# Patient Record
Sex: Male | Born: 1950 | Race: White | Hispanic: No | Marital: Single | Smoking: Former smoker
Health system: Southern US, Community
[De-identification: ages and names within clinical notes are randomized; demographics above are authoritative.]

## PROBLEM LIST (undated history)

## (undated) DIAGNOSIS — G43909 Migraine, unspecified, not intractable, without status migrainosus: Secondary | ICD-10-CM

## (undated) DIAGNOSIS — M5030 Other cervical disc degeneration, unspecified cervical region: Secondary | ICD-10-CM

## (undated) DIAGNOSIS — M25559 Pain in unspecified hip: Secondary | ICD-10-CM

## (undated) DIAGNOSIS — R519 Headache, unspecified: Secondary | ICD-10-CM

## (undated) DIAGNOSIS — I10 Essential (primary) hypertension: Secondary | ICD-10-CM

## (undated) DIAGNOSIS — C61 Malignant neoplasm of prostate: Secondary | ICD-10-CM

## (undated) DIAGNOSIS — R51 Headache: Secondary | ICD-10-CM

## (undated) DIAGNOSIS — N400 Enlarged prostate without lower urinary tract symptoms: Secondary | ICD-10-CM

## (undated) HISTORY — DX: Benign prostatic hyperplasia without lower urinary tract symptoms: N40.0

## (undated) HISTORY — PX: BASAL CELL CARCINOMA EXCISION: SHX1214

## (undated) HISTORY — DX: Other cervical disc degeneration, unspecified cervical region: M50.30

## (undated) HISTORY — PX: OTHER SURGICAL HISTORY: SHX169

## (undated) HISTORY — DX: Headache, unspecified: R51.9

## (undated) HISTORY — DX: Headache: R51

## (undated) HISTORY — DX: Migraine, unspecified, not intractable, without status migrainosus: G43.909

## (undated) HISTORY — DX: Pain in unspecified hip: M25.559

---

## 2010-07-29 DEATH — deceased

## 2010-12-14 ENCOUNTER — Ambulatory Visit: Payer: Self-pay | Admitting: Family Medicine

## 2011-01-29 HISTORY — PX: COLONOSCOPY: SHX174

## 2012-12-28 DEATH — deceased

## 2013-07-23 IMAGING — CR CERVICAL SPINE - COMPLETE 4+ VIEW
1 series · 6 of 6 positions shown · non-contrast
Comparison: none

REASON FOR EXAM: cervical pain
COMMENTS:

[Series 1: view not recorded · 0.17mm/px · 6 of 6 slices shown]
[im 1/6]
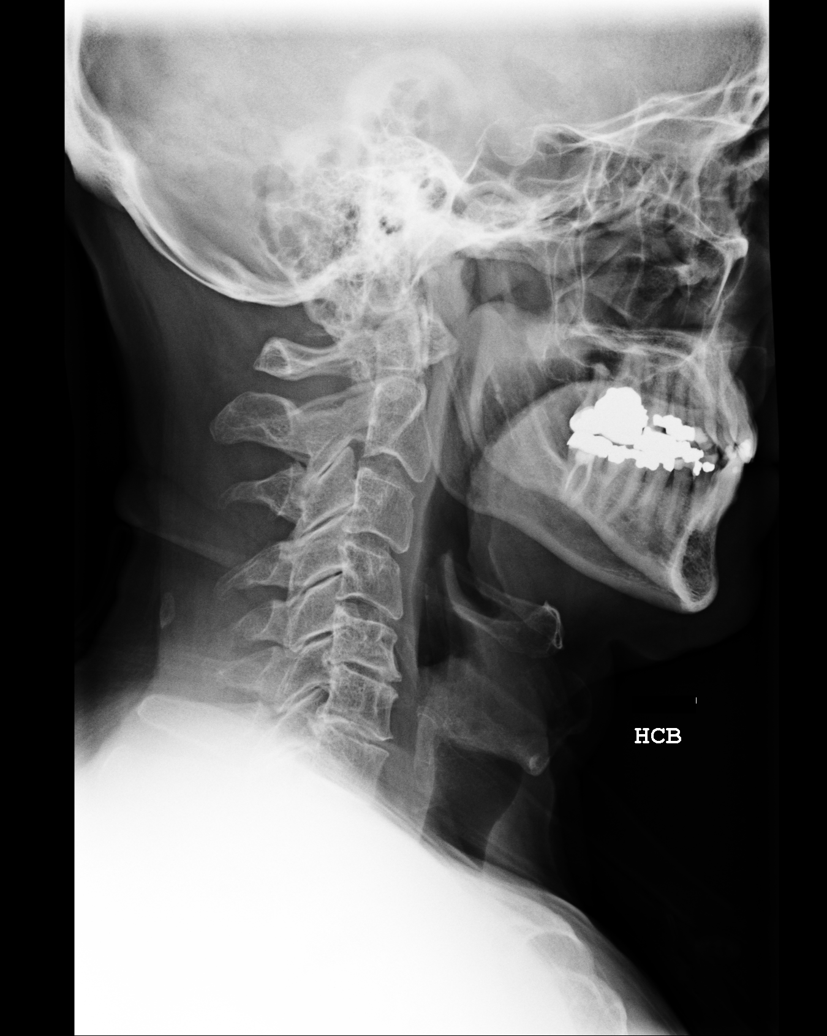
[im 2/6]
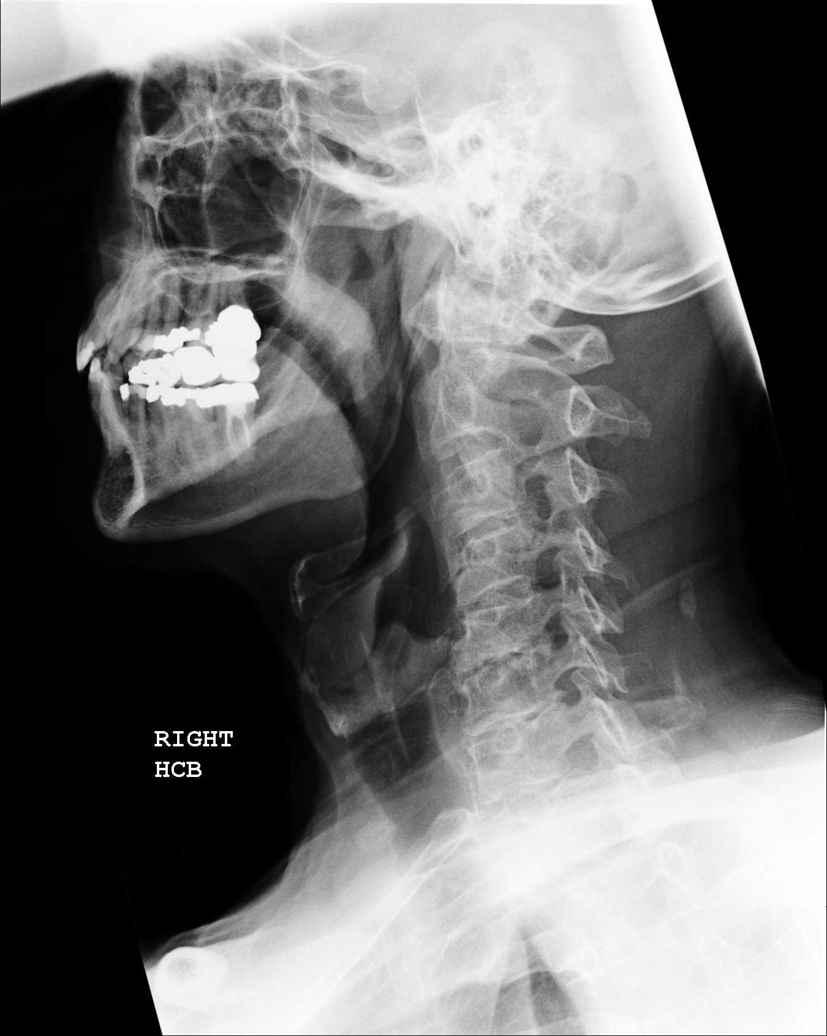
[im 3/6]
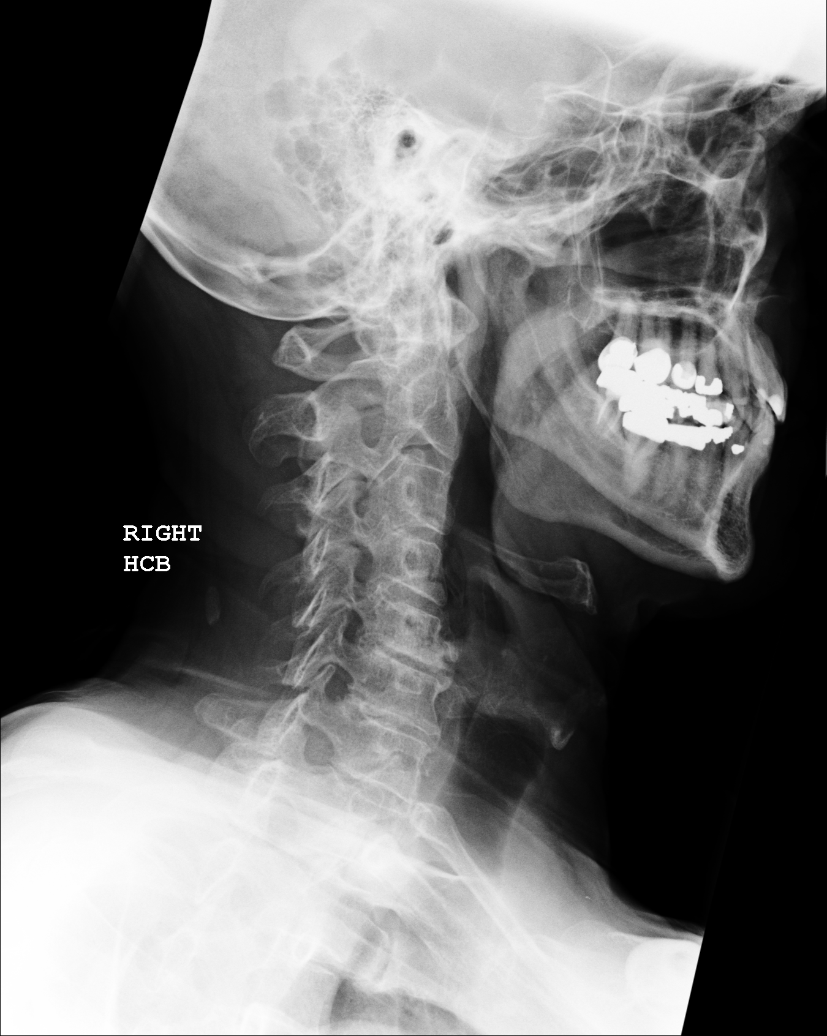
[im 4/6]
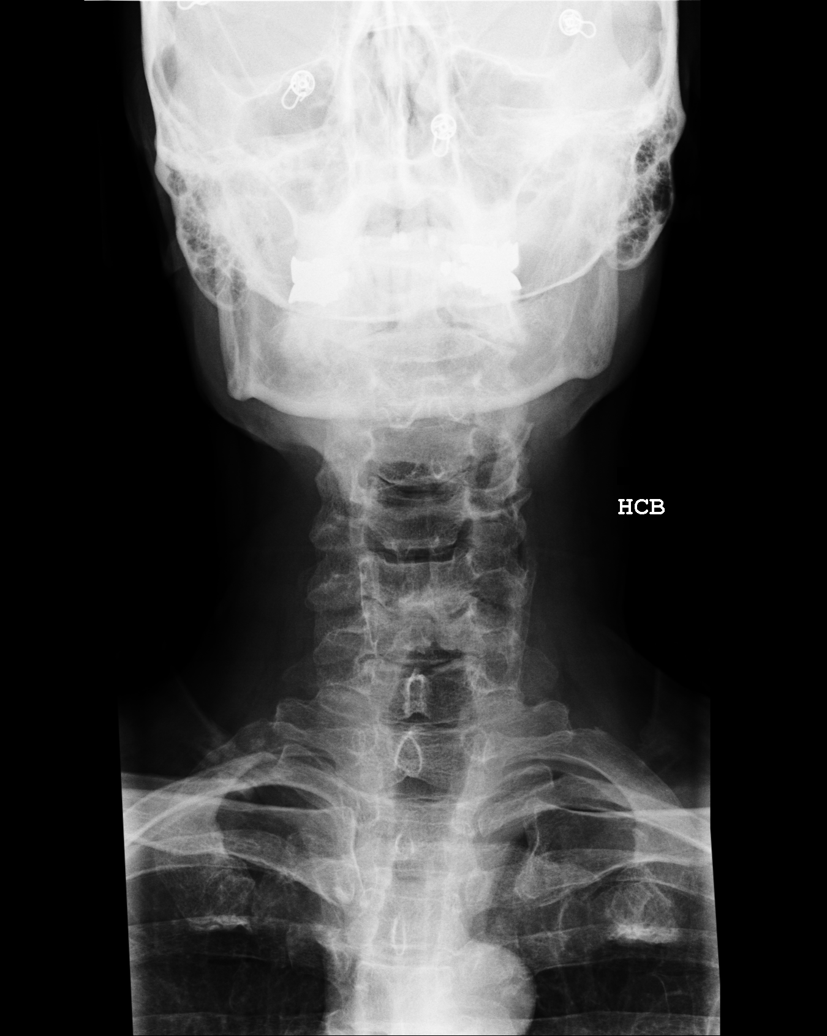
[im 5/6]
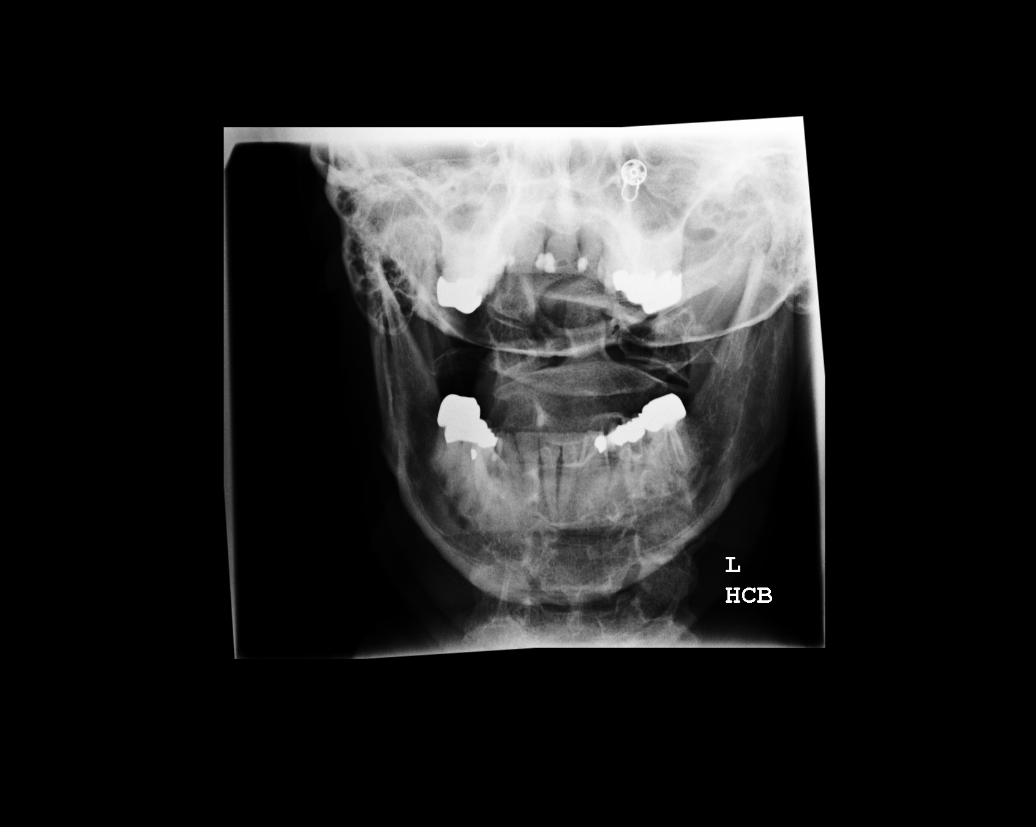
[im 6/6]
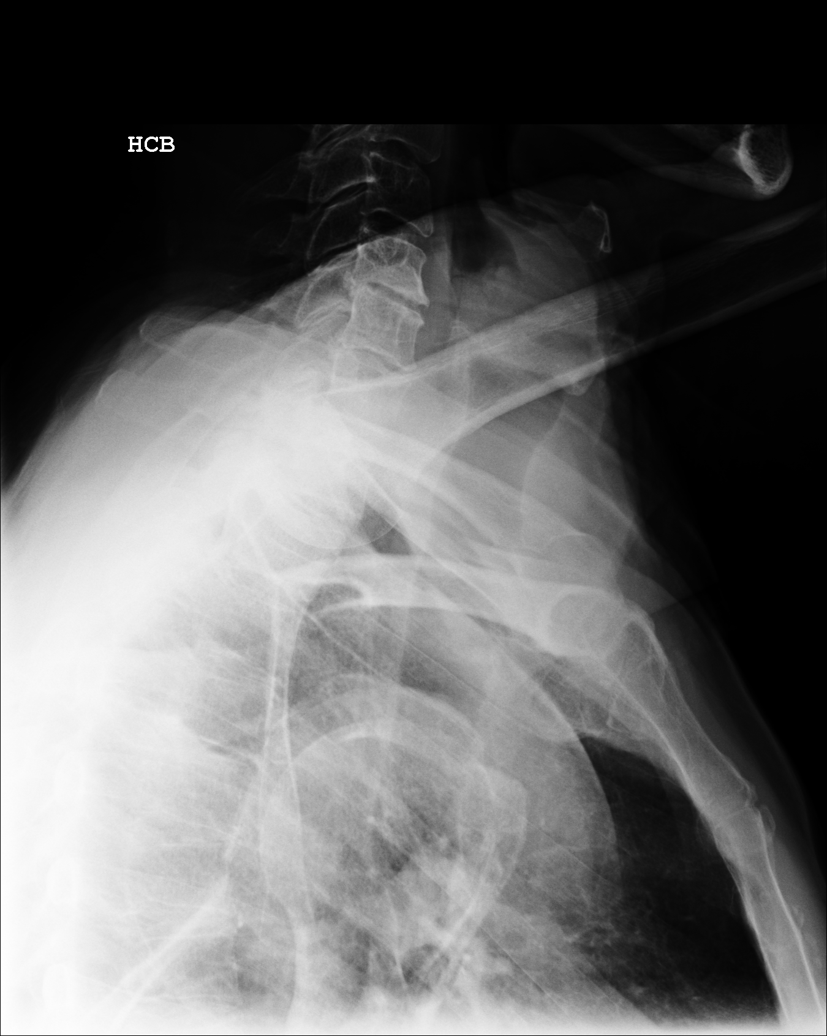

[6 of 6 positions shown; findings below may reference images not displayed]

PROCEDURE:     MDR - MDR CERVICAL SPINE COMPLETE  - December 14, 2010  [DATE]

RESULT:     The vertebral body heights are well-maintained. No fracture is
seen. There is narrowing of the C5-C6 and C6-C7 cervical disc spaces
compatible with cervical disc disease. Oblique view shows slight spur
impingement on the neural foramina bilaterally at C5-C6 and C6-C7. The
neural foramina otherwise are widely patent bilaterally. The odontoid
process is intact. No cervical rib formation is seen in the lateral view.
There is noted absence of the usual lordotic curvature which suggests
cervical muscle spasm.
IMPRESSION: 1. No fracture is seen.
2. There are changes of cervical disc disease at C5-C6 and C6-C7 with
associated mild spur impingement on the neural foramina bilaterally at these
levels.
3. There is straightening of the cervical spine which suggests cervical
muscle spasm.
4. The changes of cervical disc disease could be further evaluated by MR if
clinically indicated.

## 2013-08-20 ENCOUNTER — Ambulatory Visit: Payer: Self-pay | Admitting: Family Medicine

## 2013-08-20 LAB — LIPID PANEL
CHOLESTEROL: 169 mg/dL (ref 0–200)
HDL: 77 mg/dL — AB (ref 35–70)
LDL Cholesterol: 77 mg/dL
TRIGLYCERIDES: 75 mg/dL (ref 40–160)

## 2013-08-20 LAB — BASIC METABOLIC PANEL
Creatinine: 0.9 mg/dL (ref <=1.3)
Glucose: 89 mg/dL

## 2013-08-20 LAB — PSA: PSA: 2

## 2014-05-24 DIAGNOSIS — M1990 Unspecified osteoarthritis, unspecified site: Secondary | ICD-10-CM | POA: Insufficient documentation

## 2014-05-24 DIAGNOSIS — R51 Headache: Secondary | ICD-10-CM

## 2014-05-24 DIAGNOSIS — F410 Panic disorder [episodic paroxysmal anxiety] without agoraphobia: Secondary | ICD-10-CM | POA: Insufficient documentation

## 2014-05-24 DIAGNOSIS — R519 Headache, unspecified: Secondary | ICD-10-CM | POA: Insufficient documentation

## 2014-05-24 DIAGNOSIS — M9979 Connective tissue and disc stenosis of intervertebral foramina of abdomen and other regions: Secondary | ICD-10-CM | POA: Insufficient documentation

## 2014-05-24 DIAGNOSIS — Z0000 Encounter for general adult medical examination without abnormal findings: Secondary | ICD-10-CM | POA: Insufficient documentation

## 2014-05-24 DIAGNOSIS — Z8669 Personal history of other diseases of the nervous system and sense organs: Secondary | ICD-10-CM | POA: Insufficient documentation

## 2014-05-24 DIAGNOSIS — N400 Enlarged prostate without lower urinary tract symptoms: Secondary | ICD-10-CM | POA: Insufficient documentation

## 2014-08-26 ENCOUNTER — Encounter: Payer: Self-pay | Admitting: Family Medicine

## 2014-09-05 ENCOUNTER — Other Ambulatory Visit: Payer: Self-pay | Admitting: Family Medicine

## 2014-09-05 DIAGNOSIS — M2550 Pain in unspecified joint: Secondary | ICD-10-CM

## 2014-10-07 ENCOUNTER — Ambulatory Visit (INDEPENDENT_AMBULATORY_CARE_PROVIDER_SITE_OTHER): Payer: Medicare Other | Admitting: Family Medicine

## 2014-10-07 ENCOUNTER — Encounter: Payer: Self-pay | Admitting: Family Medicine

## 2014-10-07 VITALS — HR 70 | Ht 70.0 in | Wt 176.0 lb

## 2014-10-07 DIAGNOSIS — E785 Hyperlipidemia, unspecified: Secondary | ICD-10-CM

## 2014-10-07 DIAGNOSIS — M5090 Cervical disc disorder, unspecified, unspecified cervical region: Secondary | ICD-10-CM | POA: Diagnosis not present

## 2014-10-07 DIAGNOSIS — Z0000 Encounter for general adult medical examination without abnormal findings: Secondary | ICD-10-CM | POA: Diagnosis not present

## 2014-10-07 DIAGNOSIS — G43909 Migraine, unspecified, not intractable, without status migrainosus: Secondary | ICD-10-CM

## 2014-10-07 DIAGNOSIS — R030 Elevated blood-pressure reading, without diagnosis of hypertension: Secondary | ICD-10-CM

## 2014-10-07 DIAGNOSIS — M2550 Pain in unspecified joint: Secondary | ICD-10-CM | POA: Diagnosis not present

## 2014-10-07 DIAGNOSIS — N400 Enlarged prostate without lower urinary tract symptoms: Secondary | ICD-10-CM

## 2014-10-07 DIAGNOSIS — IMO0001 Reserved for inherently not codable concepts without codable children: Secondary | ICD-10-CM

## 2014-10-07 DIAGNOSIS — M1611 Unilateral primary osteoarthritis, right hip: Secondary | ICD-10-CM | POA: Diagnosis not present

## 2014-10-07 MED ORDER — PROPRANOLOL HCL ER 80 MG PO CP24: 160.0000 mg | ORAL_CAPSULE | Freq: Every day | ORAL | Status: DC

## 2014-10-07 MED ORDER — CYCLOBENZAPRINE HCL 10 MG PO TABS: 10.0000 mg | ORAL_TABLET | ORAL | Status: DC | PRN

## 2014-10-07 MED ORDER — BUTALBITAL-ASPIRIN-CAFFEINE 50-325-40 MG PO CAPS: 1.0000 | ORAL_CAPSULE | ORAL | Status: DC | PRN

## 2014-10-07 MED ORDER — DUTASTERIDE 0.5 MG PO CAPS: 0.5000 mg | ORAL_CAPSULE | Freq: Every day | ORAL | Status: DC

## 2014-10-07 MED ORDER — ETODOLAC 500 MG PO TABS
500.0000 mg | ORAL_TABLET | Freq: Two times a day (BID) | ORAL | Status: DC
Start: 2014-10-07 — End: 2015-04-07

## 2014-10-07 NOTE — Progress Notes (Signed)
Name: Marcus Gonzalez   MRN: [id number]    DOB: 1950/07/14   Date:10/07/2014       Progress Note  Subjective  Chief Complaint  Chief Complaint  Patient presents with  . Annual Exam  . Headache  . degenerative disc disease    HPI Comments: physical exam without subject and objective concerns  Headache  This is a recurrent problem. The current episode started more than 1 year ago. The problem has been waxing and waning. The pain is located in the occipital region. The pain does not radiate. The quality of the pain is described as aching. Associated symptoms include blurred vision. Pertinent negatives include no abdominal pain, back pain, coughing, dizziness, ear pain, fever, hearing loss, insomnia, loss of balance, nausea, neck pain, numbness, phonophobia, photophobia, sore throat, tingling or weight loss. Nothing aggravates the symptoms. He has tried beta blockers for the symptoms. The treatment provided significant relief.    No problem-specific assessment & plan notes found for this encounter.   Past Medical History  Diagnosis Date  . Degenerative disc disease, cervical   . BPH (benign prostatic hyperplasia)   . Migraine   . Hip pain   . Degenerative disc disease, cervical   . Migraine     Past Surgical History  Procedure Laterality Date  . Skin ca removed    . Colonoscopy  2013    cleared for 5 yrs- Dr Allen Norris    Family History  Problem Relation Age of Onset  . Heart disease Father   . Diabetes Maternal Aunt   . Cancer Maternal Grandmother     Social History   Social History  . Marital Status: Single    Spouse Name: N/A  . Number of Children: N/A  . Years of Education: N/A   Occupational History  . Not on file.   Social History Main Topics  . Smoking status: Never Smoker   . Smokeless tobacco: Not on file  . Alcohol Use: 0.0 oz/week    0 Standard drinks or equivalent per week  . Drug Use: No  . Sexual Activity: Yes   Other Topics Concern  . Not on file    Social History Narrative    Allergies  Allergen Reactions  . Cephalexin      Review of Systems  Constitutional: Negative for fever, chills, weight loss and malaise/fatigue.  HENT: Negative for ear discharge, ear pain, hearing loss and sore throat.   Eyes: Positive for blurred vision. Negative for photophobia.  Respiratory: Negative for cough, sputum production, shortness of breath and wheezing.   Cardiovascular: Negative for chest pain, palpitations and leg swelling.  Gastrointestinal: Positive for heartburn. Negative for nausea, abdominal pain, diarrhea, constipation, blood in stool and melena.  Genitourinary: Negative for dysuria, urgency, frequency and hematuria.  Musculoskeletal: Negative for myalgias, back pain, joint pain and neck pain.  Skin: Negative for rash.  Neurological: Positive for headaches. Negative for dizziness, tingling, sensory change, focal weakness, numbness and loss of balance.  Endo/Heme/Allergies: Negative for environmental allergies and polydipsia. Does not bruise/bleed easily.  Psychiatric/Behavioral: Negative for depression and suicidal ideas. The patient is not nervous/anxious and does not have insomnia.      Objective  Filed Vitals:   10/07/14 0825  Pulse: 70  Height: 5\' 10"  (1.778 m)  Weight: 176 lb (79.833 kg)    Physical Exam  Constitutional: He is oriented to person, place, and time and well-developed, well-nourished, and in no distress.  HENT:  Head: Normocephalic.  Right Ear:  External ear normal.  Left Ear: External ear normal.  Nose: Nose normal.  Mouth/Throat: Oropharynx is clear and moist.  Eyes: Conjunctivae and EOM are normal. Pupils are equal, round, and reactive to light. Right eye exhibits no discharge. Left eye exhibits no discharge. No scleral icterus.  Neck: Normal range of motion. Neck supple. No JVD present. No tracheal deviation present. No thyromegaly present.  Cardiovascular: Normal rate, regular rhythm, normal heart  sounds and intact distal pulses.  Exam reveals no gallop and no friction rub.   No murmur heard. Pulmonary/Chest: Breath sounds normal. No respiratory distress. He has no wheezes. He has no rales.  Abdominal: Soft. Bowel sounds are normal. He exhibits no mass. There is no hepatosplenomegaly. There is no tenderness. There is no rebound, no guarding and no CVA tenderness.  Musculoskeletal: Normal range of motion. He exhibits no edema or tenderness.  Lymphadenopathy:    He has no cervical adenopathy.  Neurological: He is alert and oriented to person, place, and time. He has normal sensation, normal strength, normal reflexes and intact cranial nerves. No cranial nerve deficit.  Skin: Skin is warm. No rash noted.  Psychiatric: Mood and affect normal.      Assessment & Plan  Problem List Items Addressed This Visit    None    Visit Diagnoses    Annual physical exam    -  Primary    Relevant Orders    Renal Function Panel    Migraine without status migrainosus, not intractable, unspecified migraine type        Relevant Medications    butalbital-aspirin-caffeine (FIORINAL) 50-325-40 MG per capsule    cyclobenzaprine (FLEXERIL) 10 MG tablet    etodolac (LODINE) 500 MG tablet    propranolol ER (INDERAL LA) 80 MG 24 hr capsule    BPH (benign prostatic hyperplasia)        Relevant Medications    dutasteride (AVODART) 0.5 MG capsule    Other Relevant Orders    PSA    Primary osteoarthritis of right hip        Relevant Medications    butalbital-aspirin-caffeine (FIORINAL) 50-325-40 MG per capsule    cyclobenzaprine (FLEXERIL) 10 MG tablet    etodolac (LODINE) 500 MG tablet    Cervical disc disease        Joint pain        Relevant Medications    etodolac (LODINE) 500 MG tablet    Hyperlipidemia        Relevant Medications    propranolol ER (INDERAL LA) 80 MG 24 hr capsule    Other Relevant Orders    Lipid Profile    Elevated blood pressure             Dr. Marius Betts [facility]  10/07/2014

## 2014-10-07 NOTE — Addendum Note (Signed)
Addended by: Fredderick Severance on: 10/07/2014 04:06 PM   Modules accepted: Orders

## 2014-10-08 LAB — LIPID PANEL
CHOLESTEROL TOTAL: 156 mg/dL (ref 100–199)
Chol/HDL Ratio: 2.3 ratio units (ref 0.0–5.0)
HDL: 67 mg/dL (ref >39)
LDL CALC: 73 mg/dL (ref 0–99)
TRIGLYCERIDES: 80 mg/dL (ref 0–149)
VLDL Cholesterol Cal: 16 mg/dL (ref 5–40)

## 2014-10-08 LAB — RENAL FUNCTION PANEL
Albumin: 4.4 g/dL (ref 3.6–4.8)
BUN / CREAT RATIO: 14 (ref 10–22)
BUN: 13 mg/dL (ref 8–27)
CALCIUM: 9.8 mg/dL (ref 8.6–10.2)
CO2: 27 mmol/L (ref 18–29)
CREATININE: 0.95 mg/dL (ref 0.76–1.27)
Chloride: 100 mmol/L (ref 97–108)
GFR calc Af Amer: 97 mL/min/{1.73_m2} (ref >59)
GFR calc non Af Amer: 84 mL/min/{1.73_m2} (ref >59)
GLUCOSE: 78 mg/dL (ref 65–99)
PHOSPHORUS: 2.8 mg/dL (ref 2.5–4.5)
POTASSIUM: 4.6 mmol/L (ref 3.5–5.2)
SODIUM: 142 mmol/L (ref 134–144)

## 2014-10-08 LAB — PSA: Prostate Specific Ag, Serum: 2.4 ng/mL (ref 0.0–4.0)

## 2014-10-10 ENCOUNTER — Other Ambulatory Visit: Payer: Self-pay

## 2014-10-19 ENCOUNTER — Other Ambulatory Visit: Payer: Self-pay

## 2014-10-19 DIAGNOSIS — N400 Enlarged prostate without lower urinary tract symptoms: Secondary | ICD-10-CM

## 2014-10-19 DIAGNOSIS — M2550 Pain in unspecified joint: Secondary | ICD-10-CM

## 2014-10-19 MED ORDER — FINASTERIDE 5 MG PO TABS: 5.0000 mg | ORAL_TABLET | Freq: Every day | ORAL | Status: DC

## 2014-10-19 MED ORDER — MELOXICAM 7.5 MG PO TABS: 7.5000 mg | ORAL_TABLET | Freq: Every day | ORAL | Status: DC

## 2014-11-06 DIAGNOSIS — H542 Low vision, both eyes: Secondary | ICD-10-CM | POA: Diagnosis not present

## 2014-11-06 DIAGNOSIS — Z87891 Personal history of nicotine dependence: Secondary | ICD-10-CM | POA: Diagnosis not present

## 2014-11-06 DIAGNOSIS — R0981 Nasal congestion: Secondary | ICD-10-CM | POA: Diagnosis not present

## 2014-11-06 DIAGNOSIS — H16201 Unspecified keratoconjunctivitis, right eye: Secondary | ICD-10-CM | POA: Diagnosis not present

## 2014-11-07 DIAGNOSIS — B300 Keratoconjunctivitis due to adenovirus: Secondary | ICD-10-CM | POA: Diagnosis not present

## 2014-11-07 DIAGNOSIS — H10433 Chronic follicular conjunctivitis, bilateral: Secondary | ICD-10-CM | POA: Diagnosis not present

## 2014-11-07 DIAGNOSIS — S0501XD Injury of conjunctiva and corneal abrasion without foreign body, right eye, subsequent encounter: Secondary | ICD-10-CM | POA: Diagnosis not present

## 2014-11-10 ENCOUNTER — Other Ambulatory Visit: Payer: Self-pay

## 2014-11-10 DIAGNOSIS — S0501XD Injury of conjunctiva and corneal abrasion without foreign body, right eye, subsequent encounter: Secondary | ICD-10-CM | POA: Diagnosis not present

## 2014-11-10 DIAGNOSIS — C44229 Squamous cell carcinoma of skin of left ear and external auricular canal: Secondary | ICD-10-CM | POA: Diagnosis not present

## 2014-11-10 DIAGNOSIS — B300 Keratoconjunctivitis due to adenovirus: Secondary | ICD-10-CM | POA: Diagnosis not present

## 2014-11-10 DIAGNOSIS — C44719 Basal cell carcinoma of skin of left lower limb, including hip: Secondary | ICD-10-CM | POA: Diagnosis not present

## 2014-11-11 DIAGNOSIS — D2272 Melanocytic nevi of left lower limb, including hip: Secondary | ICD-10-CM | POA: Diagnosis not present

## 2014-11-11 DIAGNOSIS — D485 Neoplasm of uncertain behavior of skin: Secondary | ICD-10-CM | POA: Diagnosis not present

## 2014-11-11 DIAGNOSIS — D2271 Melanocytic nevi of right lower limb, including hip: Secondary | ICD-10-CM | POA: Diagnosis not present

## 2014-11-11 DIAGNOSIS — D2261 Melanocytic nevi of right upper limb, including shoulder: Secondary | ICD-10-CM | POA: Diagnosis not present

## 2014-11-11 DIAGNOSIS — Z85828 Personal history of other malignant neoplasm of skin: Secondary | ICD-10-CM | POA: Diagnosis not present

## 2014-11-14 DIAGNOSIS — X32XXXA Exposure to sunlight, initial encounter: Secondary | ICD-10-CM | POA: Diagnosis not present

## 2014-11-14 DIAGNOSIS — L570 Actinic keratosis: Secondary | ICD-10-CM | POA: Diagnosis not present

## 2014-11-14 DIAGNOSIS — B300 Keratoconjunctivitis due to adenovirus: Secondary | ICD-10-CM | POA: Diagnosis not present

## 2014-11-17 ENCOUNTER — Other Ambulatory Visit: Payer: Self-pay | Admitting: Family Medicine

## 2014-11-17 DIAGNOSIS — B300 Keratoconjunctivitis due to adenovirus: Secondary | ICD-10-CM | POA: Diagnosis not present

## 2014-11-21 ENCOUNTER — Other Ambulatory Visit: Payer: Self-pay

## 2014-11-21 DIAGNOSIS — I10 Essential (primary) hypertension: Secondary | ICD-10-CM

## 2014-11-21 MED ORDER — LISINOPRIL 10 MG PO TABS: 10.0000 mg | ORAL_TABLET | Freq: Every day | ORAL | Status: DC

## 2014-11-22 ENCOUNTER — Encounter: Payer: Self-pay | Admitting: Family Medicine

## 2014-11-22 ENCOUNTER — Ambulatory Visit (INDEPENDENT_AMBULATORY_CARE_PROVIDER_SITE_OTHER): Payer: Medicare Other | Admitting: Family Medicine

## 2014-11-22 VITALS — BP 120/80 | HR 64 | Ht 70.0 in | Wt 170.0 lb

## 2014-11-22 DIAGNOSIS — J0100 Acute maxillary sinusitis, unspecified: Secondary | ICD-10-CM

## 2014-11-22 DIAGNOSIS — I10 Essential (primary) hypertension: Secondary | ICD-10-CM

## 2014-11-22 DIAGNOSIS — Z23 Encounter for immunization: Secondary | ICD-10-CM

## 2014-11-22 MED ORDER — AZITHROMYCIN 250 MG PO TABS: ORAL_TABLET | ORAL | Status: DC

## 2014-11-22 MED ORDER — LISINOPRIL 10 MG PO TABS: 10.0000 mg | ORAL_TABLET | Freq: Every day | ORAL | Status: DC

## 2014-11-22 NOTE — Addendum Note (Signed)
Addended by: Otilio Miu C on: 11/22/2014 12:11 PM   Modules accepted: Miquel Dunn

## 2014-11-22 NOTE — Addendum Note (Signed)
Addended by: Otilio Miu C on: 11/22/2014 12:17 PM   Modules accepted: Miquel Dunn

## 2014-11-22 NOTE — Progress Notes (Addendum)
Name: Marcus Gonzalez   MRN: [id number]    DOB: January 07, 1951   Date:11/22/2014       Progress Note  Subjective  Chief Complaint  Chief Complaint  Patient presents with  . Sinusitis  . Hypertension    Sinusitis This is a new problem. The current episode started yesterday. The problem has been waxing and waning since onset. There has been no fever. Associated symptoms include ear pain and sinus pressure. Pertinent negatives include no chills, congestion, coughing, diaphoresis, headaches, hoarse voice, neck pain, shortness of breath, sneezing, sore throat or swollen glands. The treatment provided no relief.  Hypertension Pertinent negatives include no blurred vision, chest pain, headaches, malaise/fatigue, neck pain, palpitations or shortness of breath.    No problem-specific assessment & plan notes found for this encounter.   Past Medical History  Diagnosis Date  . Degenerative disc disease, cervical   . BPH (benign prostatic hyperplasia)   . Migraine   . Hip pain   . Degenerative disc disease, cervical   . Migraine     Past Surgical History  Procedure Laterality Date  . Skin ca removed    . Colonoscopy  2013    cleared for 5 yrs- Dr Allen Norris    Family History  Problem Relation Age of Onset  . Heart disease Father   . Diabetes Maternal Aunt   . Cancer Maternal Grandmother     Social History   Social History  . Marital Status: Single    Spouse Name: N/A  . Number of Children: N/A  . Years of Education: N/A   Occupational History  . Not on file.   Social History Main Topics  . Smoking status: Never Smoker   . Smokeless tobacco: Not on file  . Alcohol Use: 0.0 oz/week    0 Standard drinks or equivalent per week  . Drug Use: No  . Sexual Activity: Yes   Other Topics Concern  . Not on file   Social History Narrative    Allergies  Allergen Reactions  . Cephalexin      Review of Systems  Constitutional: Negative for fever, chills, weight loss,  malaise/fatigue and diaphoresis.  HENT: Positive for ear pain and sinus pressure. Negative for congestion, ear discharge, hoarse voice, sneezing and sore throat.   Eyes: Negative for blurred vision.  Respiratory: Negative for cough, sputum production, shortness of breath and wheezing.   Cardiovascular: Negative for chest pain, palpitations and leg swelling.  Gastrointestinal: Negative for heartburn, nausea, abdominal pain, diarrhea, constipation, blood in stool and melena.  Genitourinary: Negative for dysuria, urgency, frequency and hematuria.  Musculoskeletal: Negative for myalgias, back pain, joint pain and neck pain.  Skin: Negative for rash.  Neurological: Negative for dizziness, tingling, sensory change, focal weakness and headaches.  Endo/Heme/Allergies: Negative for environmental allergies and polydipsia. Does not bruise/bleed easily.  Psychiatric/Behavioral: Negative for depression and suicidal ideas. The patient is not nervous/anxious and does not have insomnia.      Objective  Filed Vitals:   11/22/14 1150  BP: 120/80  Pulse: 64  Height: 5\' 10"  (1.778 m)  Weight: 170 lb (77.111 kg)    Physical Exam  Constitutional: He is oriented to person, place, and time and well-developed, well-nourished, and in no distress.  HENT:  Head: Normocephalic.  Right Ear: External ear normal.  Left Ear: External ear normal.  Nose: Nose normal.  Mouth/Throat: Oropharynx is clear and moist.  Eyes: Conjunctivae and EOM are normal. Pupils are equal, round, and reactive to light.  Right eye exhibits no discharge. Left eye exhibits no discharge. No scleral icterus.  Neck: Normal range of motion. Neck supple. No JVD present. No tracheal deviation present. No thyromegaly present.  Cardiovascular: Normal rate, regular rhythm, normal heart sounds and intact distal pulses.  Exam reveals no gallop and no friction rub.   No murmur heard. Pulmonary/Chest: Breath sounds normal. No respiratory distress. He  has no wheezes. He has no rales.  Abdominal: Soft. Bowel sounds are normal. He exhibits no mass. There is no hepatosplenomegaly. There is no tenderness. There is no rebound, no guarding and no CVA tenderness.  Musculoskeletal: Normal range of motion. He exhibits no edema or tenderness.  Lymphadenopathy:    He has no cervical adenopathy.  Neurological: He is alert and oriented to person, place, and time. He has normal sensation, normal strength and intact cranial nerves. No cranial nerve deficit.  Skin: Skin is warm. No rash noted.  Psychiatric: Mood and affect normal.  Nursing note and vitals reviewed.     Assessment & Plan  Problem List Items Addressed This Visit    None    Visit Diagnoses    Need for influenza vaccination    -  Primary    Relevant Orders    Flu Vaccine QUAD 36+ mos PF IM (Fluarix & Fluzone Quad PF) (Completed)    Acute maxillary sinusitis, recurrence not specified        Relevant Medications    azithromycin (ZITHROMAX) 250 MG tablet    Essential hypertension        Relevant Medications    lisinopril (PRINIVIL,ZESTRIL) 10 MG tablet         Dr. Eulah Walkup [facility]  11/22/2014

## 2014-11-23 DIAGNOSIS — C44719 Basal cell carcinoma of skin of left lower limb, including hip: Secondary | ICD-10-CM | POA: Diagnosis not present

## 2014-11-24 DIAGNOSIS — B0052 Herpesviral keratitis: Secondary | ICD-10-CM | POA: Diagnosis not present

## 2014-11-24 DIAGNOSIS — B300 Keratoconjunctivitis due to adenovirus: Secondary | ICD-10-CM | POA: Diagnosis not present

## 2014-12-05 DIAGNOSIS — B0052 Herpesviral keratitis: Secondary | ICD-10-CM | POA: Diagnosis not present

## 2014-12-05 DIAGNOSIS — B300 Keratoconjunctivitis due to adenovirus: Secondary | ICD-10-CM | POA: Diagnosis not present

## 2014-12-26 DIAGNOSIS — M722 Plantar fascial fibromatosis: Secondary | ICD-10-CM | POA: Diagnosis not present

## 2014-12-26 DIAGNOSIS — M79672 Pain in left foot: Secondary | ICD-10-CM | POA: Diagnosis not present

## 2015-01-09 DIAGNOSIS — M722 Plantar fascial fibromatosis: Secondary | ICD-10-CM | POA: Diagnosis not present

## 2015-01-09 DIAGNOSIS — B0052 Herpesviral keratitis: Secondary | ICD-10-CM | POA: Diagnosis not present

## 2015-01-11 DIAGNOSIS — C44229 Squamous cell carcinoma of skin of left ear and external auricular canal: Secondary | ICD-10-CM | POA: Diagnosis not present

## 2015-03-03 DIAGNOSIS — D2261 Melanocytic nevi of right upper limb, including shoulder: Secondary | ICD-10-CM | POA: Diagnosis not present

## 2015-03-03 DIAGNOSIS — Z85828 Personal history of other malignant neoplasm of skin: Secondary | ICD-10-CM | POA: Diagnosis not present

## 2015-03-03 DIAGNOSIS — D2272 Melanocytic nevi of left lower limb, including hip: Secondary | ICD-10-CM | POA: Diagnosis not present

## 2015-03-03 DIAGNOSIS — L570 Actinic keratosis: Secondary | ICD-10-CM | POA: Diagnosis not present

## 2015-03-03 DIAGNOSIS — X32XXXA Exposure to sunlight, initial encounter: Secondary | ICD-10-CM | POA: Diagnosis not present

## 2015-03-03 DIAGNOSIS — C44712 Basal cell carcinoma of skin of right lower limb, including hip: Secondary | ICD-10-CM | POA: Diagnosis not present

## 2015-03-03 DIAGNOSIS — D485 Neoplasm of uncertain behavior of skin: Secondary | ICD-10-CM | POA: Diagnosis not present

## 2015-03-03 DIAGNOSIS — D0462 Carcinoma in situ of skin of left upper limb, including shoulder: Secondary | ICD-10-CM | POA: Diagnosis not present

## 2015-03-03 DIAGNOSIS — D225 Melanocytic nevi of trunk: Secondary | ICD-10-CM | POA: Diagnosis not present

## 2015-04-03 DIAGNOSIS — C44712 Basal cell carcinoma of skin of right lower limb, including hip: Secondary | ICD-10-CM | POA: Diagnosis not present

## 2015-04-03 DIAGNOSIS — D0462 Carcinoma in situ of skin of left upper limb, including shoulder: Secondary | ICD-10-CM | POA: Diagnosis not present

## 2015-04-07 ENCOUNTER — Encounter: Payer: Self-pay | Admitting: Family Medicine

## 2015-04-07 ENCOUNTER — Ambulatory Visit (INDEPENDENT_AMBULATORY_CARE_PROVIDER_SITE_OTHER): Payer: Medicare Other | Admitting: Family Medicine

## 2015-04-07 VITALS — BP 130/92 | HR 66 | Ht 70.0 in | Wt 176.0 lb

## 2015-04-07 DIAGNOSIS — Z0000 Encounter for general adult medical examination without abnormal findings: Secondary | ICD-10-CM | POA: Diagnosis not present

## 2015-04-07 DIAGNOSIS — Z8669 Personal history of other diseases of the nervous system and sense organs: Secondary | ICD-10-CM | POA: Diagnosis not present

## 2015-04-07 DIAGNOSIS — M2550 Pain in unspecified joint: Secondary | ICD-10-CM

## 2015-04-07 DIAGNOSIS — I10 Essential (primary) hypertension: Secondary | ICD-10-CM | POA: Diagnosis not present

## 2015-04-07 MED ORDER — ETODOLAC 500 MG PO TABS: 500.0000 mg | ORAL_TABLET | Freq: Two times a day (BID) | ORAL | Status: DC

## 2015-04-07 MED ORDER — LISINOPRIL 10 MG PO TABS: 10.0000 mg | ORAL_TABLET | Freq: Every day | ORAL | Status: DC

## 2015-04-07 MED ORDER — MELOXICAM 7.5 MG PO TABS: 7.5000 mg | ORAL_TABLET | Freq: Every day | ORAL | Status: DC

## 2015-04-07 NOTE — Patient Instructions (Signed)
The DASH Diet  Dietary Approaches to Stop Hypertension   What is hypertension?  Hypertension is the term for blood pressure that is consistently higher than normal. Blood pressure is the force of blood against artery walls. Blood pressure can be unhealthy if it is above 120/80. The higher your blood pressure, the greater the health risk. High blood pressure can be controlled if you take these steps:  . Maintain a healthy weight.  . Be physically active.  . Follow a healthy eating plan, which includes foods lower in salt and sodium.  . If you drink alcoholic beverages, do so in moderation.  As noted in this list, diet affects high blood pressure. Following the DASH diet and reducing the amount of sodium in your diet will help lower your blood pressure. It will also help prevent high blood pressure.   What is the DASH diet?  Dietary Approaches to Stop Hypertension (DASH) is a diet that is low in saturated fat, cholesterol, and total fat. It emphasizes fruits, vegetables, and low-fat dairy foods. The DASH diet also includes whole-grain products, fish, poultry, and nuts. It encourages fewer servings of red meat, sweets, and sugar-containing beverages. It is rich in magnesium, potassium, and calcium, as well as protein and fiber.   How do I get started on the DASH diet?  The DASH diet requires no special foods and has no hard-to-follow recipes. Start by seeing how DASH compares with your current eating habits. The DASH eating plan shown is based on 2,000 calories a day.   Your health care provider or a dietitian can help you determine how many calories a day you need. Most adults need somewhere between 1600 and 2800 calories a day. Serving sizes will vary between 1/2 cup and 1 1/4 cups. Check the product's nutrition label to determine serving sizes of particular products.  Type of Food Servings for Diet of 2000 Calories/Day  Grains/Grain products (include at least 3 whole grain foods each day) 7-8   Fruits 4-5  Vegetables 4-5  Low fat or non-fat dairy foods 2-3  Lean meats, fish, poultry 2 or less  Nuts, seeds, and legumes 4-5/week  Fats and sweets Limited   Make changes gradually.  Here are some suggestions that might help:  . If you now eat 1 or 2 servings of vegetables a day, add a serving at lunch and another at dinner.  . If you don't eat fruit now or have only juice at breakfast, add a serving to your meals or have it as a snack.  . Drink milk or water with lunch or dinner instead of soda, sugar sweetened tea, or alcohol. Choose low-fat (1%) or fat-free (skim) dairy products to reduce how much saturated fat, total fat, cholesterol, and calories you eat.  . If you have trouble digesting dairy products, try taking lactase enzyme pills or drops (available at drugstores and groceries) with the dairy foods. Or buy lactose-free milk or milk with lactase enzyme added to it.  . Read food labels on margarines and salad dressings to choose products lowest in fat.  . If you now eat large portions of meat, cut back gradually--by a half or a third at each meal. Limit meat to 6 ounces a day (2 servings). Three to four ounces is about the size of a deck of cards.  . Have 2 or more vegetarian-style (meatless) meals each week.   Increase servings of vegetables, rice, pasta, and beans in all meals.  Try casseroles and pasta, and   stir-fry dishes, which have less meat and more vegetables, grains, and beans.  . Use fruits canned in their own juice. Fresh fruits require little or no preparation. Dried fruits are a good choice to carry with you or to have ready in the car.  . Try these snacks ideas: unsalted pretzels or nuts mixed with raisins, graham crackers, low-fat and fat-free yogurt and frozen yogurt, popcorn with no salt or butter added, and raw vegetables.  . Choose whole grain foods to get more nutrients, including minerals and fiber. For example, choose whole-wheat bread or whole-grain cereals.   . Use fresh, frozen, or no-salt-added canned vegetables.   Remember to also reduce the salt and sodium in your diet. Try to have no more than 2000 milligrams (mg) of sodium per day, with a goal of further reducing the sodium to 1500 mg per day. Three important ways to reduce sodium are:  . Use reduced-sodium or no-salt-added food products.  . Use less salt when you prepare foods and do not add salt to your food at the table.  . Read fool labels. Aim for foods that are less than 5 percent of the daily value of sodium.   The DASH eating plan was not designed for weight loss. But it contains many lower calorie foods, such as fruits and vegetables. You can make it lower in calories by replacing higher calorie foods with more fruits and vegetables. Some ideas to increase fruits and vegetables and decrease calories include:  . Eat a medium apple instead of four shortbread cookies. You'll save 80 calories.  . Eat 1/4 cup of dried apricots instead of a 2-ounce bag of pork rinds. You'll save 230 calories.  . Have a hamburger that's 3 ounces instead of 6 ounces. Add a  cup serving of carrots and a 1/2 cup serving of spinach. You'll save more than 200 calories.  . Instead of 5 ounces of chicken, have a stir fry with 2 ounces ofchicken and 1 and 1/2 cups of raw vegetables. Use a small amount of vegetable oil. You'll save 50 calories.  . Have a 1/2 cup serving of low-fat frozen yogurt instead of a 1 and 1/2 ounce milk chocolate bar. You'll save about 110 calories.  . Use low-fat or fat-free condiments, such as fat free salad dressings.  . Eat smaller portions--cut back gradually.  . Use food labels to compare fat content in packaged foods. Items marked low-fat or fat-free may be lower in fat without being lower in calories than their regular versions.  . Limit foods with lots of added sugar, such as pies, flavored yogurts, candy bars, ice cream, sherbet, regular soft drinks, and fruit drinks.  . Drink water  or club soda instead of cola or other soda drinks.    

## 2015-04-07 NOTE — Progress Notes (Signed)
Patient: Marcus Gonzalez, Male    DOB: [birth year], 65 y.o.   MRN: [id number] Visit Date: 04/07/2015  Today's Provider: Otilio Miu, MD   Chief Complaint  Patient presents with  . Annual Exam    feeling fine but has had some past issues that he wanted to update you on- 1) dog scratched cornea in eye- better now. 2) tore tendon in L) foot- better now. 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 Everything is cleared up- just an FYI  . Hypertension  . Migraine  . Arthritis   Subjective:   Initial preventative physical exam Marcus Gonzalez is a 65 y.o. male who presents today for his Initial Preventative Physical Exam. He feels well. He reports exercising . He reports he is sleeping poorly.  HPI Comments: Patient present for maw.   Hypertension This is a chronic problem. The current episode started more than 1 year ago. The problem has been waxing and waning since onset. The problem is uncontrolled. Pertinent negatives include no anxiety, blurred vision, chest pain, headaches, malaise/fatigue, neck pain, orthopnea, palpitations, peripheral edema, PND, shortness of breath or sweats. Agents associated with hypertension include NSAIDs. There are no known risk factors for coronary artery disease. Past treatments include beta blockers and ACE inhibitors. The current treatment provides mild improvement. There are no compliance problems.  There is no history of angina, kidney disease, CAD/MI, CVA, heart failure, left ventricular hypertrophy, PVD, renovascular disease or retinopathy. There is no history of chronic renal disease or a hypertension causing med.  Migraine  This is a chronic problem. The quality of the pain is described as aching. Pertinent negatives include no abdominal pain, back pain, blurred vision, coughing, dizziness, ear pain, eye pain, eye redness, fever, hearing loss, nausea, neck pain, numbness, photophobia, seizures, sore throat, vomiting or weakness. He has tried beta blockers for the  symptoms. His past medical history is significant for hypertension.    Review of Systems  Constitutional: Negative for fever, chills, malaise/fatigue, appetite change, fatigue and unexpected weight change.  HENT: Negative for ear pain, facial swelling, hearing loss, nosebleeds, sneezing, sore throat and trouble swallowing.   Eyes: Negative for blurred vision, photophobia, pain, discharge, redness, itching and visual disturbance.  Respiratory: Negative for cough, choking, chest tightness, shortness of breath and wheezing.   Cardiovascular: Negative for chest pain, palpitations, orthopnea, leg swelling and PND.  Gastrointestinal: Negative for nausea, vomiting, abdominal pain, diarrhea, constipation, blood in stool and rectal pain.  Endocrine: Negative for cold intolerance, heat intolerance, polydipsia, polyphagia and polyuria.  Genitourinary: Negative for dysuria, urgency, frequency, hematuria, flank pain, decreased urine volume, discharge, penile swelling, scrotal swelling, difficulty urinating, penile pain and testicular pain.  Musculoskeletal: Negative for back pain, joint swelling, neck pain and neck stiffness.  Skin: Negative for color change and rash.  Allergic/Immunologic: Negative for immunocompromised state.  Neurological: Negative for dizziness, tremors, seizures, syncope, speech difficulty, weakness, light-headedness, numbness and headaches.  Hematological: Does not bruise/bleed easily.  Psychiatric/Behavioral: Negative for suicidal ideas, hallucinations, behavioral problems, confusion, self-injury, dysphoric mood and agitation. The patient is not nervous/anxious.     Social History   Social History  . Marital Status: Single    Spouse Name: N/A  . Number of Children: N/A  . Years of Education: N/A   Occupational History  . Not on file.   Social History Main Topics  . Smoking status: Never Smoker   . Smokeless tobacco: Not on file  . Alcohol Use: 0.0 oz/week    0  Standard  drinks or equivalent per week  . Drug Use: No  . Sexual Activity: Yes   Other Topics Concern  . Not on file   Social History Narrative    Patient Active Problem List   Diagnosis Date Noted  . Cephalalgia 05/24/2014  . Routine general medical examination at a health care facility 05/24/2014  . Anxiety attack 05/24/2014  . Arthritis 05/24/2014  . Benign fibroma of prostate 05/24/2014  . Narrowing of intervertebral disc space 05/24/2014  . History of migraine headaches 05/24/2014    Past Surgical History  Procedure Laterality Date  . Skin ca removed    . Colonoscopy  2013    cleared for 5 yrs- Dr Allen Norris    His family history includes Cancer in his maternal grandmother; Diabetes in his maternal aunt; Heart disease in his father.    Previous Medications   BUTALBITAL-ASPIRIN-CAFFEINE (FIORINAL) 50-325-40 MG PER CAPSULE    Take 1 capsule by mouth as needed.   PROPRANOLOL ER (INDERAL LA) 80 MG 24 HR CAPSULE    Take 2 capsules (160 mg total) by mouth daily.    Patient Care Team: Juline Patch, MD as PCP - General (Family Medicine)     Objective:   Vitals: BP 130/92 mmHg  Pulse 66  Ht 5\' 10"  (1.778 m)  Wt 176 lb (79.833 kg)  BMI 25.25 kg/m2  Physical Exam  Constitutional: He is oriented to person, place, and time. He appears well-developed and well-nourished.  HENT:  Head: Normocephalic.  Right Ear: External ear normal.  Left Ear: External ear normal.  Nose: Nose normal.  Mouth/Throat: Oropharynx is clear and moist.  Eyes: Conjunctivae and EOM are normal. Pupils are equal, round, and reactive to light.  Neck: Normal range of motion. Neck supple.  Cardiovascular: Normal rate, regular rhythm, normal heart sounds and intact distal pulses.   Pulmonary/Chest: Effort normal and breath sounds normal.  Abdominal: Soft. Bowel sounds are normal.  Genitourinary: Rectum normal, prostate normal and penis normal.  Musculoskeletal: Normal range of motion.  Neurological: He is  alert and oriented to person, place, and time. He has normal reflexes.  Skin: Skin is warm and dry.  Psychiatric: He has a normal mood and affect. His behavior is normal. Judgment and thought content normal.  Nursing note and vitals reviewed.    No exam data present  Activities of Daily Living In your present state of health, do you have any difficulty performing the following activities: 04/07/2015 10/07/2014  Hearing? N N  Vision? N N  Difficulty concentrating or making decisions? N N  Walking or climbing stairs? N N  Dressing or bathing? N N  Doing errands, shopping? N N    Fall Risk Assessment Fall Risk  04/07/2015 10/07/2014  Falls in the past year? No No     Patient reports there are not safety devices in place in shower at home.   Depression Screen PHQ 2/9 Scores 04/07/2015 10/07/2014  PHQ - 2 Score 0 0    Cognitive Testing - 6-CIT   Correct? Score   What year is it? yes 1 Yes = 0    No = 4  What month is it? yes 0 Yes = 0    No = 3  Remember:     Pia [address]au, [place], [place]     What time is it? yes 0 Yes = 0    No = 3  Count backwards from 20 to 1 yes 0 Correct =  0    1 error = 2   More than 1 error = 4  Say the months of the year in reverse. yes 0 Correct = 0    1 error = 2   More than 1 error = 4  What address did I ask you to remember? yes 0 Correct = 0  1 error = 2    2 error = 4    3 error = 6    4 error = 8    All wrong = 10       TOTAL SCORE  0/28   Interpretation:  Normal  Normal (0-7) Abnormal (8-28)     Assessment & Plan:     Initial Preventative Physical Exam  Reviewed patient's Family Medical History Reviewed and updated list of patient's medical providers Assessment of cognitive impairment was done Assessed patient's functional ability Established a written schedule for health screening [place] Completed and Reviewed  Exercise Activities and Dietary recommendations Goals    None      Immunization History   Administered Date(s) Administered  . Influenza,inj,Quad PF,36+ Mos 11/22/2014  . Influenza-Unspecified 10/29/2013  . Tdap 04/03/2010    Health Maintenance  Topic Date Due  . Hepatitis C Screening  [birth year]  . HIV Screening  10/24/1964  . INFLUENZA VACCINE  08/29/2015  . PNA vac Low Risk Adult (2 of 2 - PPSV23) 04/06/2016  . COLONOSCOPY  03/28/2020  . TETANUS/TDAP  04/02/2020  . ZOSTAVAX  Addressed      Discussed health benefits of physical activity, and encouraged him to engage in regular exercise appropriate for his age and condition.    ------------------------------------------------------------------------------------------------------------   Problem List Items Addressed This Visit      Other   History of migraine headaches    Other Visit Diagnoses    Medicare annual wellness visit, initial    -  Primary    Essential hypertension        Relevant Medications    lisinopril (PRINIVIL,ZESTRIL) 10 MG tablet    Joint pain        Relevant Medications    etodolac (LODINE) 500 MG tablet    meloxicam (MOBIC) 7.5 MG tablet      Pt refused vaccination today  Otilio Miu, MD [facility]  04/07/2015

## 2015-05-08 ENCOUNTER — Other Ambulatory Visit: Payer: Self-pay

## 2015-05-08 MED ORDER — LISINOPRIL 20 MG PO TABS: 20.0000 mg | ORAL_TABLET | Freq: Every day | ORAL | Status: DC

## 2015-05-17 DIAGNOSIS — C44229 Squamous cell carcinoma of skin of left ear and external auricular canal: Secondary | ICD-10-CM | POA: Diagnosis not present

## 2015-05-22 ENCOUNTER — Other Ambulatory Visit: Payer: Self-pay

## 2015-05-22 MED ORDER — AMLODIPINE BESYLATE 5 MG PO TABS: 5.0000 mg | ORAL_TABLET | Freq: Every day | ORAL | Status: DC

## 2015-05-22 MED ORDER — CYCLOBENZAPRINE HCL 10 MG PO TABS: 10.0000 mg | ORAL_TABLET | Freq: Three times a day (TID) | ORAL | Status: DC | PRN

## 2015-06-05 ENCOUNTER — Other Ambulatory Visit: Payer: Self-pay

## 2015-06-09 ENCOUNTER — Ambulatory Visit (INDEPENDENT_AMBULATORY_CARE_PROVIDER_SITE_OTHER): Payer: Medicare Other | Admitting: Family Medicine

## 2015-06-09 ENCOUNTER — Encounter: Payer: Self-pay | Admitting: Family Medicine

## 2015-06-09 VITALS — BP 130/90 | HR 88 | Ht 70.0 in | Wt 179.0 lb

## 2015-06-09 DIAGNOSIS — I10 Essential (primary) hypertension: Secondary | ICD-10-CM | POA: Diagnosis not present

## 2015-06-09 MED ORDER — LISINOPRIL 20 MG PO TABS: 20.0000 mg | ORAL_TABLET | Freq: Every day | ORAL | Status: DC

## 2015-06-09 MED ORDER — AMLODIPINE BESYLATE 5 MG PO TABS: 5.0000 mg | ORAL_TABLET | Freq: Every day | ORAL | Status: DC

## 2015-06-09 NOTE — Progress Notes (Signed)
Name: Marcus Gonzalez   MRN: [id number]    DOB: 06-18-1950   Date:06/09/2015       Progress Note  Subjective  Chief Complaint  Chief Complaint  Patient presents with  . Hypertension    Hypertension This is a recurrent problem. The current episode started in the past 7 days. The problem has been waxing and waning since onset. The problem is controlled. Pertinent negatives include no anxiety, blurred vision, chest pain, headaches, malaise/fatigue, neck pain, orthopnea, palpitations, peripheral edema, PND, shortness of breath or sweats. There are no associated agents to hypertension. Past treatments include ACE inhibitors and calcium channel blockers. The current treatment provides mild improvement. There are no compliance problems.  There is no history of angina, kidney disease, CAD/MI, CVA, heart failure, left ventricular hypertrophy, PVD, renovascular disease or retinopathy. There is no history of chronic renal disease or a hypertension causing med.    No problem-specific assessment & plan notes found for this encounter.   Past Medical History  Diagnosis Date  . Degenerative disc disease, cervical   . BPH (benign prostatic hyperplasia)   . Migraine   . Hip pain   . Degenerative disc disease, cervical   . Migraine     Past Surgical History  Procedure Laterality Date  . Skin ca removed    . Colonoscopy  2013    cleared for 5 yrs- Dr Allen Norris    Family History  Problem Relation Age of Onset  . Heart disease Father   . Diabetes Maternal Aunt   . Cancer Maternal Grandmother     Social History   Social History  . Marital Status: Single    Spouse Name: N/A  . Number of Children: N/A  . Years of Education: N/A   Occupational History  . Not on file.   Social History Main Topics  . Smoking status: Never Smoker   . Smokeless tobacco: Not on file  . Alcohol Use: 0.0 oz/week    0 Standard drinks or equivalent per week  . Drug Use: No  . Sexual Activity: Yes   Other Topics  Concern  . Not on file   Social History Narrative    Allergies  Allergen Reactions  . Cephalexin      Review of Systems  Constitutional: Negative for fever, chills, weight loss and malaise/fatigue.  HENT: Negative for ear discharge, ear pain and sore throat.   Eyes: Negative for blurred vision.  Respiratory: Negative for cough, sputum production, shortness of breath and wheezing.   Cardiovascular: Negative for chest pain, palpitations, orthopnea, leg swelling and PND.  Gastrointestinal: Negative for heartburn, nausea, abdominal pain, diarrhea, constipation, blood in stool and melena.  Genitourinary: Negative for dysuria, urgency, frequency and hematuria.  Musculoskeletal: Negative for myalgias, back pain, joint pain and neck pain.  Skin: Negative for rash.  Neurological: Negative for dizziness, tingling, sensory change, focal weakness and headaches.  Endo/Heme/Allergies: Negative for environmental allergies and polydipsia. Does not bruise/bleed easily.  Psychiatric/Behavioral: Negative for depression and suicidal ideas. The patient is not nervous/anxious and does not have insomnia.      Objective  Filed Vitals:   06/09/15 1421  BP: 130/90  Pulse: 88  Height: 5\' 10"  (1.778 m)  Weight: 179 lb (81.194 kg)    Physical Exam  Constitutional: He is oriented to person, place, and time and well-developed, well-nourished, and in no distress.  HENT:  Head: Normocephalic.  Right Ear: External ear normal.  Left Ear: External ear normal.  Nose: Nose normal.  Mouth/Throat: Oropharynx is clear and moist.  Eyes: Conjunctivae and EOM are normal. Pupils are equal, round, and reactive to light. Right eye exhibits no discharge. Left eye exhibits no discharge. No scleral icterus.  Neck: Normal range of motion. Neck supple. No JVD present. No tracheal deviation present. No thyromegaly present.  Cardiovascular: Normal rate, regular rhythm, normal heart sounds and intact distal pulses.  Exam  reveals no gallop and no friction rub.   No murmur heard. Pulmonary/Chest: Breath sounds normal. No respiratory distress. He has no wheezes. He has no rales.  Abdominal: Soft. Bowel sounds are normal. He exhibits no mass. There is no hepatosplenomegaly. There is no tenderness. There is no rebound, no guarding and no CVA tenderness.  Musculoskeletal: Normal range of motion. He exhibits no edema or tenderness.  Lymphadenopathy:    He has no cervical adenopathy.  Neurological: He is alert and oriented to person, place, and time. He has normal sensation, normal strength and intact cranial nerves. No cranial nerve deficit.  Skin: Skin is warm. No rash noted.  Psychiatric: Mood and affect normal.  Nursing note and vitals reviewed.     Assessment & Plan  Problem List Items Addressed This Visit    None    Visit Diagnoses    Essential hypertension    -  Primary    Relevant Medications    amLODipine (NORVASC) 5 MG tablet    lisinopril (PRINIVIL,ZESTRIL) 20 MG tablet         Dr. [facility]  06/09/2015

## 2015-07-06 ENCOUNTER — Other Ambulatory Visit: Payer: Self-pay | Admitting: Family Medicine

## 2015-07-12 DIAGNOSIS — C44212 Basal cell carcinoma of skin of right ear and external auricular canal: Secondary | ICD-10-CM | POA: Diagnosis not present

## 2015-07-12 DIAGNOSIS — D2272 Melanocytic nevi of left lower limb, including hip: Secondary | ICD-10-CM | POA: Diagnosis not present

## 2015-07-12 DIAGNOSIS — L570 Actinic keratosis: Secondary | ICD-10-CM | POA: Diagnosis not present

## 2015-07-12 DIAGNOSIS — D2261 Melanocytic nevi of right upper limb, including shoulder: Secondary | ICD-10-CM | POA: Diagnosis not present

## 2015-07-12 DIAGNOSIS — D225 Melanocytic nevi of trunk: Secondary | ICD-10-CM | POA: Diagnosis not present

## 2015-07-12 DIAGNOSIS — Z85828 Personal history of other malignant neoplasm of skin: Secondary | ICD-10-CM | POA: Diagnosis not present

## 2015-07-12 DIAGNOSIS — C44719 Basal cell carcinoma of skin of left lower limb, including hip: Secondary | ICD-10-CM | POA: Diagnosis not present

## 2015-07-12 DIAGNOSIS — D485 Neoplasm of uncertain behavior of skin: Secondary | ICD-10-CM | POA: Diagnosis not present

## 2015-07-20 ENCOUNTER — Other Ambulatory Visit: Payer: Self-pay

## 2015-08-07 ENCOUNTER — Ambulatory Visit (INDEPENDENT_AMBULATORY_CARE_PROVIDER_SITE_OTHER): Payer: Medicare Other | Admitting: Family Medicine

## 2015-08-07 ENCOUNTER — Encounter: Payer: Self-pay | Admitting: Family Medicine

## 2015-08-07 VITALS — BP 138/90 | HR 70 | Ht 70.0 in | Wt 173.0 lb

## 2015-08-07 DIAGNOSIS — R3911 Hesitancy of micturition: Secondary | ICD-10-CM

## 2015-08-07 DIAGNOSIS — R361 Hematospermia: Secondary | ICD-10-CM

## 2015-08-07 MED ORDER — SULFAMETHOXAZOLE-TRIMETHOPRIM 800-160 MG PO TABS: 1.0000 | ORAL_TABLET | Freq: Two times a day (BID) | ORAL | Status: DC

## 2015-08-07 NOTE — Progress Notes (Signed)
Name: Marcus Gonzalez   MRN: [id number]    DOB: 01-23-1951   Date:08/07/2015       Progress Note  Subjective  Chief Complaint  No chief complaint on file.   Urinary Tract Infection  This is a new problem. The current episode started in the past 7 days. The problem occurs intermittently. The problem has been waxing and waning. There has been no fever. Associated symptoms include hesitancy. Pertinent negatives include no chills, discharge, flank pain, frequency, hematuria, nausea, possible pregnancy, sweats, urgency or vomiting. He has tried nothing for the symptoms. The treatment provided mild relief. There is no history of catheterization, kidney stones, recurrent UTIs or urinary stasis.    No problem-specific assessment & plan notes found for this encounter.   Past Medical History  Diagnosis Date  . Degenerative disc disease, cervical   . BPH (benign prostatic hyperplasia)   . Migraine   . Hip pain   . Degenerative disc disease, cervical   . Migraine     Past Surgical History  Procedure Laterality Date  . Skin ca removed    . Colonoscopy  2013    cleared for 5 yrs- Dr Allen Norris    Family History  Problem Relation Age of Onset  . Heart disease Father   . Diabetes Maternal Aunt   . Cancer Maternal Grandmother     Social History   Social History  . Marital Status: Single    Spouse Name: N/A  . Number of Children: N/A  . Years of Education: N/A   Occupational History  . Not on file.   Social History Main Topics  . Smoking status: Never Smoker   . Smokeless tobacco: Not on file  . Alcohol Use: 0.0 oz/week    0 Standard drinks or equivalent per week  . Drug Use: No  . Sexual Activity: Yes   Other Topics Concern  . Not on file   Social History Narrative    Allergies  Allergen Reactions  . Cephalexin      Review of Systems  Constitutional: Negative for fever, chills, weight loss and malaise/fatigue.  HENT: Negative for ear discharge, ear pain and sore  throat.   Eyes: Negative for blurred vision.  Respiratory: Negative for cough, sputum production, shortness of breath and wheezing.   Cardiovascular: Negative for chest pain, palpitations and leg swelling.  Gastrointestinal: Negative for heartburn, nausea, vomiting, abdominal pain, diarrhea, constipation, blood in stool and melena.  Genitourinary: Positive for hesitancy. Negative for dysuria, urgency, frequency, hematuria and flank pain.  Musculoskeletal: Negative for myalgias, back pain, joint pain and neck pain.  Skin: Negative for rash.  Neurological: Negative for dizziness, tingling, sensory change, focal weakness and headaches.  Endo/Heme/Allergies: Negative for environmental allergies and polydipsia. Does not bruise/bleed easily.  Psychiatric/Behavioral: Negative for depression and suicidal ideas. The patient is not nervous/anxious and does not have insomnia.      Objective  Filed Vitals:   08/07/15 1334  BP: 138/90  Pulse: 70  Height: 5\' 10"  (1.778 m)  Weight: 173 lb (78.472 kg)    Physical Exam  Constitutional: He is oriented to person, place, and time and well-developed, well-nourished, and in no distress.  HENT:  Head: Normocephalic.  Right Ear: External ear normal.  Left Ear: External ear normal.  Nose: Nose normal.  Mouth/Throat: Oropharynx is clear and moist.  Eyes: Conjunctivae and EOM are normal. Pupils are equal, round, and reactive to light. Right eye exhibits no discharge. Left eye exhibits no discharge. No  scleral icterus.  Neck: Normal range of motion. Neck supple. No JVD present. No tracheal deviation present. No thyromegaly present.  Cardiovascular: Normal rate, regular rhythm, normal heart sounds and intact distal pulses.  Exam reveals no gallop and no friction rub.   No murmur heard. Pulmonary/Chest: Breath sounds normal. No respiratory distress. He has no wheezes. He has no rales.  Abdominal: Soft. Bowel sounds are normal. He exhibits no mass. There is no  hepatosplenomegaly. There is no tenderness. There is no rebound, no guarding and no CVA tenderness.  Genitourinary: Rectum normal, prostate normal and testes/scrotum normal. Rectal exam shows no tenderness. He exhibits no abnormal testicular mass, no testicular tenderness, no abnormal scrotal mass and no scrotal tenderness.  Musculoskeletal: Normal range of motion.  Lymphadenopathy:    He has no cervical adenopathy.  Neurological: He is alert and oriented to person, place, and time. He has normal sensation, normal strength and intact cranial nerves.  Skin: Skin is warm. No rash noted.  Psychiatric: Mood and affect normal.  Nursing note and vitals reviewed.     Assessment & Plan  Problem List Items Addressed This Visit    None    Visit Diagnoses    Hemospermia    -  Primary    Relevant Medications    sulfamethoxazole-trimethoprim (BACTRIM DS,SEPTRA DS) 800-160 MG tablet    Hesitancy of micturition        Relevant Medications    sulfamethoxazole-trimethoprim (BACTRIM DS,SEPTRA DS) 800-160 MG tablet         Dr. Deanna Jones [facility]  08/07/2015

## 2015-09-02 ENCOUNTER — Other Ambulatory Visit: Payer: Self-pay | Admitting: Family Medicine

## 2015-09-02 DIAGNOSIS — I10 Essential (primary) hypertension: Secondary | ICD-10-CM

## 2015-09-03 ENCOUNTER — Other Ambulatory Visit: Payer: Self-pay | Admitting: Family Medicine

## 2015-09-03 DIAGNOSIS — I10 Essential (primary) hypertension: Secondary | ICD-10-CM

## 2015-09-06 DIAGNOSIS — C44719 Basal cell carcinoma of skin of left lower limb, including hip: Secondary | ICD-10-CM | POA: Diagnosis not present

## 2015-10-13 ENCOUNTER — Ambulatory Visit (INDEPENDENT_AMBULATORY_CARE_PROVIDER_SITE_OTHER): Payer: Medicare Other | Admitting: Family Medicine

## 2015-10-13 ENCOUNTER — Encounter: Payer: Self-pay | Admitting: Family Medicine

## 2015-10-13 VITALS — BP 110/70 | HR 70 | Ht 70.0 in | Wt 167.0 lb

## 2015-10-13 DIAGNOSIS — N400 Enlarged prostate without lower urinary tract symptoms: Secondary | ICD-10-CM

## 2015-10-13 DIAGNOSIS — Z0000 Encounter for general adult medical examination without abnormal findings: Secondary | ICD-10-CM

## 2015-10-13 DIAGNOSIS — Z1211 Encounter for screening for malignant neoplasm of colon: Secondary | ICD-10-CM | POA: Diagnosis not present

## 2015-10-13 DIAGNOSIS — M5090 Cervical disc disorder, unspecified, unspecified cervical region: Secondary | ICD-10-CM

## 2015-10-13 DIAGNOSIS — I10 Essential (primary) hypertension: Secondary | ICD-10-CM | POA: Diagnosis not present

## 2015-10-13 DIAGNOSIS — M2550 Pain in unspecified joint: Secondary | ICD-10-CM | POA: Diagnosis not present

## 2015-10-13 LAB — HEMOCCULT GUIAC POC 1CARD (OFFICE): Fecal Occult Blood, POC: NEGATIVE

## 2015-10-13 MED ORDER — PROPRANOLOL HCL ER 80 MG PO CP24: 160.0000 mg | ORAL_CAPSULE | Freq: Every day | ORAL | 6 refills | Status: DC

## 2015-10-13 MED ORDER — TRAMADOL HCL 50 MG PO TABS: 50.0000 mg | ORAL_TABLET | Freq: Two times a day (BID) | ORAL | 0 refills | Status: DC | PRN

## 2015-10-13 MED ORDER — ETODOLAC 500 MG PO TABS: 500.0000 mg | ORAL_TABLET | Freq: Two times a day (BID) | ORAL | 11 refills | Status: DC

## 2015-10-13 MED ORDER — LISINOPRIL 20 MG PO TABS: 20.0000 mg | ORAL_TABLET | Freq: Every day | ORAL | 1 refills | Status: DC

## 2015-10-13 MED ORDER — BUTALBITAL-ASPIRIN-CAFFEINE 50-325-40 MG PO CAPS: 1.0000 | ORAL_CAPSULE | ORAL | 5 refills | Status: DC | PRN

## 2015-10-13 MED ORDER — CYCLOBENZAPRINE HCL 10 MG PO TABS: 10.0000 mg | ORAL_TABLET | Freq: Three times a day (TID) | ORAL | 1 refills | Status: DC | PRN

## 2015-10-13 NOTE — Progress Notes (Signed)
Name: Marcus Gonzalez   MRN: [id number]    DOB: 1950-02-25   Date:10/13/2015       Progress Note  Subjective  Chief Complaint  Chief Complaint  Patient presents with  . Annual Exam  . Neck Pain    wants something different from cyclobenzaprine  . Hypertension    Patient for annual physical exam.   Neck Pain   This is a recurrent (awoke at night with posterior headache/neck stiffness) problem. The current episode started more than 1 year ago. The problem occurs intermittently. The problem has been gradually improving. The pain is associated with nothing. The quality of the pain is described as aching. The pain is mild. Associated symptoms include headaches. Pertinent negatives include no chest pain, fever, numbness, tingling, weakness or weight loss. He has tried nothing for the symptoms. The treatment provided mild relief.  Hypertension  This is a chronic problem. The current episode started more than 1 year ago. The problem has been gradually worsening since onset. Associated symptoms include headaches and neck pain. Pertinent negatives include no blurred vision, chest pain, malaise/fatigue, palpitations or shortness of breath. There are no associated agents to hypertension. There are no known risk factors for coronary artery disease. Past treatments include ACE inhibitors, beta blockers and calcium channel blockers. The current treatment provides mild improvement. There are no compliance problems.  There is no history of angina, kidney disease, CAD/MI, CVA, heart failure, left ventricular hypertrophy, PVD, renovascular disease or retinopathy. There is no history of chronic renal disease or a hypertension causing med.    No problem-specific Assessment & Plan notes found for this encounter.   Past Medical History:  Diagnosis Date  . BPH (benign prostatic hyperplasia)   . Degenerative disc disease, cervical   . Degenerative disc disease, cervical   . Headache   . Hip pain   . Migraine    . Migraine     Past Surgical History:  Procedure Laterality Date  . COLONOSCOPY  2013   cleared for 5 yrs- Dr Allen Norris  . skin ca removed      Family History  Problem Relation Age of Onset  . Heart disease Father   . Diabetes Maternal Aunt   . Cancer Maternal Grandmother     Social History   Social History  . Marital status: Single    Spouse name: N/A  . Number of children: N/A  . Years of education: N/A   Occupational History  . Not on file.   Social History Main Topics  . Smoking status: Never Smoker  . Smokeless tobacco: Not on file  . Alcohol use 0.0 oz/week  . Drug use: No  . Sexual activity: Yes   Other Topics Concern  . Not on file   Social History Narrative  . No narrative on file    Allergies  Allergen Reactions  . Cephalexin      Review of Systems  Constitutional: Negative for chills, fever, malaise/fatigue and weight loss.  HENT: Negative for ear discharge, ear pain and sore throat.   Eyes: Negative for blurred vision.  Respiratory: Negative for cough, sputum production, shortness of breath and wheezing.   Cardiovascular: Negative for chest pain, palpitations and leg swelling.  Gastrointestinal: Negative for abdominal pain, blood in stool, constipation, diarrhea, heartburn, melena and nausea.  Genitourinary: Negative for dysuria, frequency, hematuria and urgency.  Musculoskeletal: Positive for neck pain. Negative for back pain, joint pain and myalgias.  Skin: Negative for rash.  Neurological: Positive for headaches.  Negative for dizziness, tingling, sensory change, focal weakness, weakness and numbness.  Endo/Heme/Allergies: Negative for environmental allergies and polydipsia. Does not bruise/bleed easily.  Psychiatric/Behavioral: Negative for depression and suicidal ideas. The patient is not nervous/anxious and does not have insomnia.      Objective  Vitals:   10/13/15 0834  BP: 110/70  Pulse: 70  Weight: 167 lb (75.8 kg)  Height: 5'  10" (1.778 m)    Physical Exam  Constitutional: He is oriented to person, place, and time and well-developed, well-nourished, and in no distress.  HENT:  Head: Normocephalic.  Right Ear: External ear normal.  Left Ear: External ear normal.  Nose: Nose normal.  Mouth/Throat: Oropharynx is clear and moist.  Eyes: Conjunctivae and EOM are normal. Pupils are equal, round, and reactive to light. Right eye exhibits no discharge. Left eye exhibits no discharge. No scleral icterus.  Neck: Normal range of motion. Neck supple. No JVD present. No tracheal deviation present. No thyromegaly present.  Cardiovascular: Normal rate, regular rhythm, normal heart sounds and intact distal pulses.  Exam reveals no gallop and no friction rub.   No murmur heard. Pulmonary/Chest: Breath sounds normal. No respiratory distress. He has no wheezes. He has no rales.  Abdominal: Soft. Bowel sounds are normal. He exhibits no mass. There is no hepatosplenomegaly. There is no tenderness. There is no rebound, no guarding and no CVA tenderness.  Musculoskeletal: Normal range of motion. He exhibits no edema or tenderness.  Lymphadenopathy:    He has no cervical adenopathy.  Neurological: He is alert and oriented to person, place, and time. He has normal sensation, normal strength, normal reflexes and intact cranial nerves. No cranial nerve deficit.  Skin: Skin is warm. No rash noted.  Psychiatric: Mood and affect normal.  Nursing note and vitals reviewed.     Assessment & Plan  Problem List Items Addressed This Visit    None    Visit Diagnoses    Annual physical exam    -  Primary   Essential hypertension       Relevant Medications   propranolol ER (INDERAL LA) 80 MG 24 hr capsule   lisinopril (PRINIVIL,ZESTRIL) 20 MG tablet   Other Relevant Orders   Renal Function Panel   Cervical disc disease       Joint pain       Relevant Medications   etodolac (LODINE) 500 MG tablet   BPH (benign prostatic hyperplasia)        Relevant Medications   dutasteride (AVODART) 0.5 MG capsule   Other Relevant Orders   PSA   Colon cancer screening       Relevant Orders   POCT Occult Blood Stool (Completed)        Dr. Deanna Jones [facility]  10/13/15

## 2015-10-14 LAB — RENAL FUNCTION PANEL
Albumin: 4.3 g/dL (ref 3.6–4.8)
BUN / CREAT RATIO: 13 (ref 10–24)
BUN: 14 mg/dL (ref 8–27)
CHLORIDE: 99 mmol/L (ref 96–106)
CO2: 27 mmol/L (ref 18–29)
Calcium: 9.6 mg/dL (ref 8.6–10.2)
Creatinine, Ser: 1.06 mg/dL (ref 0.76–1.27)
GFR, EST AFRICAN AMERICAN: 85 mL/min/{1.73_m2} (ref >59)
GFR, EST NON AFRICAN AMERICAN: 73 mL/min/{1.73_m2} (ref >59)
GLUCOSE: 82 mg/dL (ref 65–99)
POTASSIUM: 4.3 mmol/L (ref 3.5–5.2)
Phosphorus: 3.2 mg/dL (ref 2.5–4.5)
SODIUM: 141 mmol/L (ref 134–144)

## 2015-10-14 LAB — PSA: PROSTATE SPECIFIC AG, SERUM: 3.7 ng/mL (ref 0.0–4.0)

## 2015-11-04 ENCOUNTER — Other Ambulatory Visit: Payer: Self-pay | Admitting: Family Medicine

## 2015-11-04 DIAGNOSIS — G43909 Migraine, unspecified, not intractable, without status migrainosus: Secondary | ICD-10-CM

## 2015-11-06 DIAGNOSIS — M722 Plantar fascial fibromatosis: Secondary | ICD-10-CM | POA: Diagnosis not present

## 2015-11-06 DIAGNOSIS — M79672 Pain in left foot: Secondary | ICD-10-CM | POA: Diagnosis not present

## 2015-11-13 ENCOUNTER — Ambulatory Visit (INDEPENDENT_AMBULATORY_CARE_PROVIDER_SITE_OTHER): Payer: Medicare Other

## 2015-11-13 DIAGNOSIS — Z23 Encounter for immunization: Secondary | ICD-10-CM

## 2015-11-16 DIAGNOSIS — C44212 Basal cell carcinoma of skin of right ear and external auricular canal: Secondary | ICD-10-CM | POA: Diagnosis not present

## 2015-11-27 DIAGNOSIS — M722 Plantar fascial fibromatosis: Secondary | ICD-10-CM | POA: Diagnosis not present

## 2015-11-27 DIAGNOSIS — M79672 Pain in left foot: Secondary | ICD-10-CM | POA: Diagnosis not present

## 2015-11-28 ENCOUNTER — Other Ambulatory Visit: Payer: Self-pay | Admitting: Family Medicine

## 2015-11-28 DIAGNOSIS — I10 Essential (primary) hypertension: Secondary | ICD-10-CM

## 2015-12-01 DIAGNOSIS — Z85828 Personal history of other malignant neoplasm of skin: Secondary | ICD-10-CM | POA: Diagnosis not present

## 2015-12-01 DIAGNOSIS — Z08 Encounter for follow-up examination after completed treatment for malignant neoplasm: Secondary | ICD-10-CM | POA: Diagnosis not present

## 2015-12-01 DIAGNOSIS — C44712 Basal cell carcinoma of skin of right lower limb, including hip: Secondary | ICD-10-CM | POA: Diagnosis not present

## 2015-12-01 DIAGNOSIS — C44612 Basal cell carcinoma of skin of right upper limb, including shoulder: Secondary | ICD-10-CM | POA: Diagnosis not present

## 2015-12-01 DIAGNOSIS — X32XXXA Exposure to sunlight, initial encounter: Secondary | ICD-10-CM | POA: Diagnosis not present

## 2015-12-01 DIAGNOSIS — D225 Melanocytic nevi of trunk: Secondary | ICD-10-CM | POA: Diagnosis not present

## 2015-12-01 DIAGNOSIS — D485 Neoplasm of uncertain behavior of skin: Secondary | ICD-10-CM | POA: Diagnosis not present

## 2015-12-01 DIAGNOSIS — L570 Actinic keratosis: Secondary | ICD-10-CM | POA: Diagnosis not present

## 2015-12-11 ENCOUNTER — Other Ambulatory Visit: Payer: Self-pay

## 2015-12-11 DIAGNOSIS — I10 Essential (primary) hypertension: Secondary | ICD-10-CM

## 2015-12-11 MED ORDER — LISINOPRIL 20 MG PO TABS: 20.0000 mg | ORAL_TABLET | Freq: Every day | ORAL | 1 refills | Status: DC

## 2015-12-14 DIAGNOSIS — C44212 Basal cell carcinoma of skin of right ear and external auricular canal: Secondary | ICD-10-CM | POA: Diagnosis not present

## 2016-02-10 ENCOUNTER — Other Ambulatory Visit: Payer: Self-pay | Admitting: Family Medicine

## 2016-02-10 DIAGNOSIS — I10 Essential (primary) hypertension: Secondary | ICD-10-CM

## 2016-02-19 DIAGNOSIS — L905 Scar conditions and fibrosis of skin: Secondary | ICD-10-CM | POA: Diagnosis not present

## 2016-02-19 DIAGNOSIS — C44712 Basal cell carcinoma of skin of right lower limb, including hip: Secondary | ICD-10-CM | POA: Diagnosis not present

## 2016-02-19 DIAGNOSIS — C44612 Basal cell carcinoma of skin of right upper limb, including shoulder: Secondary | ICD-10-CM | POA: Diagnosis not present

## 2016-03-13 ENCOUNTER — Encounter: Payer: Self-pay | Admitting: Family Medicine

## 2016-03-13 ENCOUNTER — Ambulatory Visit (INDEPENDENT_AMBULATORY_CARE_PROVIDER_SITE_OTHER): Payer: Medicare Other | Admitting: Family Medicine

## 2016-03-13 VITALS — BP 120/80 | HR 70 | Ht 70.0 in | Wt 165.0 lb

## 2016-03-13 DIAGNOSIS — S76312A Strain of muscle, fascia and tendon of the posterior muscle group at thigh level, left thigh, initial encounter: Secondary | ICD-10-CM | POA: Diagnosis not present

## 2016-03-13 NOTE — Progress Notes (Signed)
Name: Marcus Gonzalez   MRN: [id number]    DOB: [birth year]   Date:03/13/2016       Progress Note  Subjective  Chief Complaint  Chief Complaint  Patient presents with  . Leg Pain    L) sciatic pain- gets better when working out at gym or working. Gets worse at night or when sitting for long periods- hurts under butt cheek down into the back of the thigh    Leg Pain   The incident occurred more than 1 week ago (3weeks). There was no injury mechanism. The pain is present in the left thigh and left hip. The quality of the pain is described as aching. The pain is moderate. The pain has been intermittent since onset. Pertinent negatives include no inability to bear weight, loss of motion, loss of sensation, muscle weakness, numbness or tingling. The symptoms are aggravated by movement (sitting/certain exercise). He has tried heat for the symptoms. The treatment provided moderate relief.    No problem-specific Assessment & Plan notes found for this encounter.   Past Medical History:  Diagnosis Date  . BPH (benign prostatic hyperplasia)   . Degenerative disc disease, cervical   . Degenerative disc disease, cervical   . Headache   . Hip pain   . Migraine   . Migraine     Past Surgical History:  Procedure Laterality Date  . BASAL CELL CARCINOMA EXCISION     arm and leg  . COLONOSCOPY  2013   cleared for 5 yrs- Dr Allen Norris  . skin ca removed      Family History  Problem Relation Age of Onset  . Heart disease Father   . Diabetes Maternal Aunt   . Cancer Maternal Grandmother     Social History   Social History  . Marital status: Single    Spouse name: N/A  . Number of children: N/A  . Years of education: N/A   Occupational History  . Not on file.   Social History Main Topics  . Smoking status: Never Smoker  . Smokeless tobacco: Not on file  . Alcohol use 0.0 oz/week  . Drug use: No  . Sexual activity: Yes   Other Topics Concern  . Not on file   Social History  Narrative  . No narrative on file    Allergies  Allergen Reactions  . Cephalexin      Review of Systems  Constitutional: Negative for chills, fever, malaise/fatigue and weight loss.  HENT: Negative for ear discharge, ear pain and sore throat.   Eyes: Negative for blurred vision.  Respiratory: Negative for cough, sputum production, shortness of breath and wheezing.   Cardiovascular: Negative for chest pain, palpitations and leg swelling.  Gastrointestinal: Negative for abdominal pain, blood in stool, constipation, diarrhea, heartburn, melena and nausea.  Genitourinary: Negative for dysuria, frequency, hematuria and urgency.  Musculoskeletal: Negative for back pain, falls, joint pain, myalgias and neck pain.  Skin: Negative for rash.  Neurological: Negative for dizziness, tingling, sensory change, focal weakness, numbness and headaches.  Endo/Heme/Allergies: Negative for environmental allergies and polydipsia. Does not bruise/bleed easily.  Psychiatric/Behavioral: Negative for depression and suicidal ideas. The patient is not nervous/anxious and does not have insomnia.      Objective  Vitals:   03/13/16 0833  BP: 120/80  Pulse: 70  Weight: 165 lb (74.8 kg)  Height: 5\' 10"  (1.778 m)    Physical Exam  Constitutional: He is oriented to person, place, and time and well-developed, well-nourished, and in  no distress.  HENT:  Head: Normocephalic.  Right Ear: External ear normal.  Left Ear: External ear normal.  Nose: Nose normal.  Mouth/Throat: Oropharynx is clear and moist.  Eyes: Conjunctivae and EOM are normal. Pupils are equal, round, and reactive to light. Right eye exhibits no discharge. Left eye exhibits no discharge. No scleral icterus.  Neck: Normal range of motion. Neck supple. No JVD present. No tracheal deviation present. No thyromegaly present.  Cardiovascular: Normal rate, regular rhythm, normal heart sounds and intact distal pulses.  Exam reveals no gallop and no  friction rub.   No murmur heard. Pulmonary/Chest: Breath sounds normal. No respiratory distress. He has no wheezes. He has no rales.  Abdominal: Soft. Bowel sounds are normal. He exhibits no mass. There is no hepatosplenomegaly. There is no tenderness. There is no rebound, no guarding and no CVA tenderness.  Musculoskeletal: Normal range of motion. He exhibits tenderness. He exhibits no edema.       Left upper leg: He exhibits tenderness and bony tenderness.       Legs: Lymphadenopathy:    He has no cervical adenopathy.  Neurological: He is alert and oriented to person, place, and time. He has normal sensation, normal strength, normal reflexes and intact cranial nerves. No cranial nerve deficit.  Skin: Skin is warm. No rash noted.  Psychiatric: Mood and affect normal.  Nursing note and vitals reviewed.     Assessment & Plan  Problem List Items Addressed This Visit    None    Visit Diagnoses    Strain of left hamstring    -  Primary   cont etodolac and cyclobenzaprene   Relevant Orders   Ambulatory referral to Physical Therapy        Dr. Otilio Miu [facility]  03/13/16

## 2016-03-15 DIAGNOSIS — M4725 Other spondylosis with radiculopathy, thoracolumbar region: Secondary | ICD-10-CM | POA: Diagnosis not present

## 2016-03-20 ENCOUNTER — Other Ambulatory Visit: Payer: Self-pay

## 2016-03-21 DIAGNOSIS — M4725 Other spondylosis with radiculopathy, thoracolumbar region: Secondary | ICD-10-CM | POA: Diagnosis not present

## 2016-03-26 DIAGNOSIS — M4725 Other spondylosis with radiculopathy, thoracolumbar region: Secondary | ICD-10-CM | POA: Diagnosis not present

## 2016-03-29 IMAGING — CR DG LUMBAR SPINE COMPLETE 4+V
5 series · 5 of 5 positions shown · non-contrast
Comparison: None.

CLINICAL DATA: Lower back pain.

EXAM:
LUMBAR SPINE - COMPLETE 4+ VIEW

[l-spine ap]
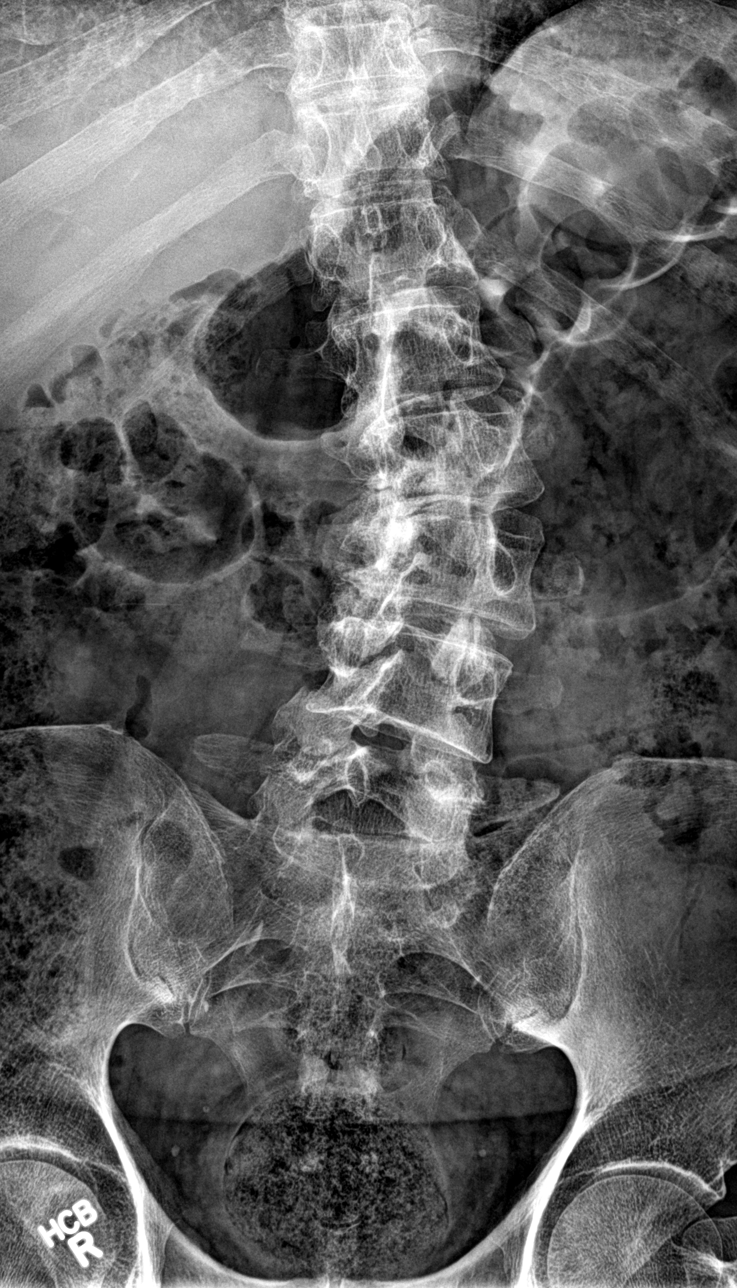

[l-spine obl (1 of 2)]
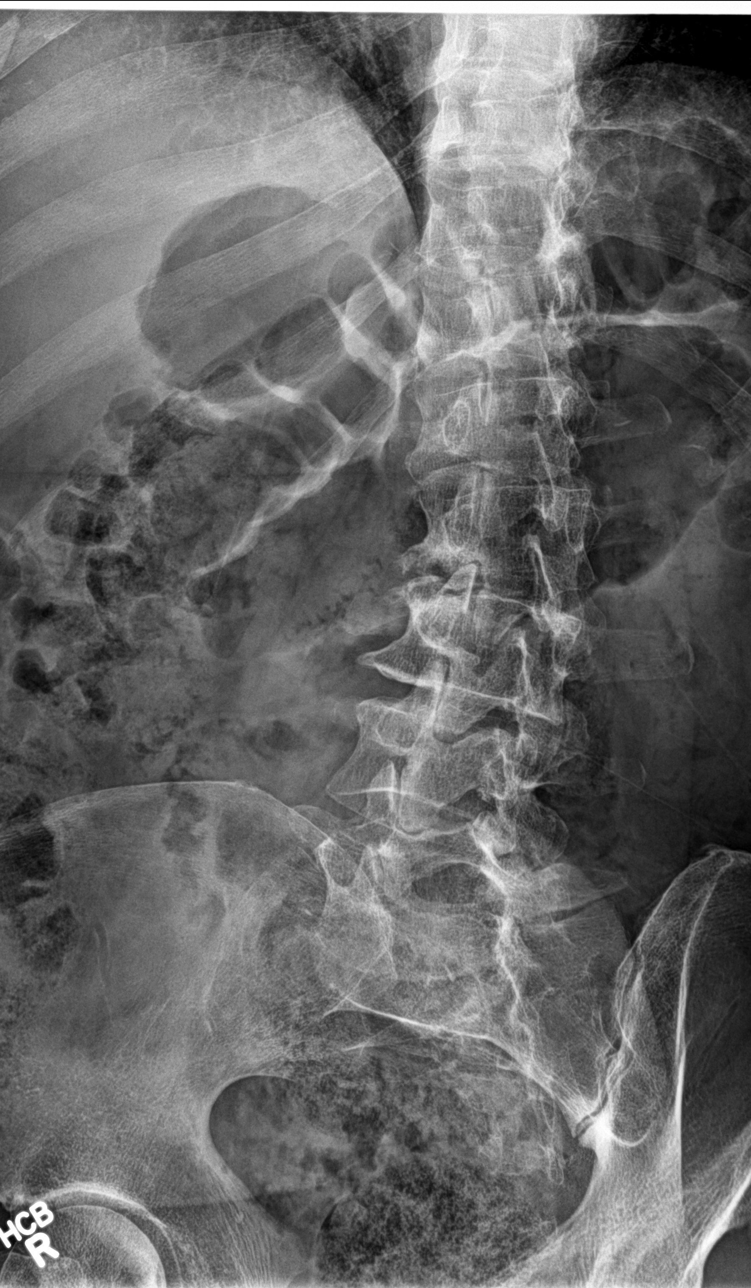

[l-spine obl (2 of 2)]
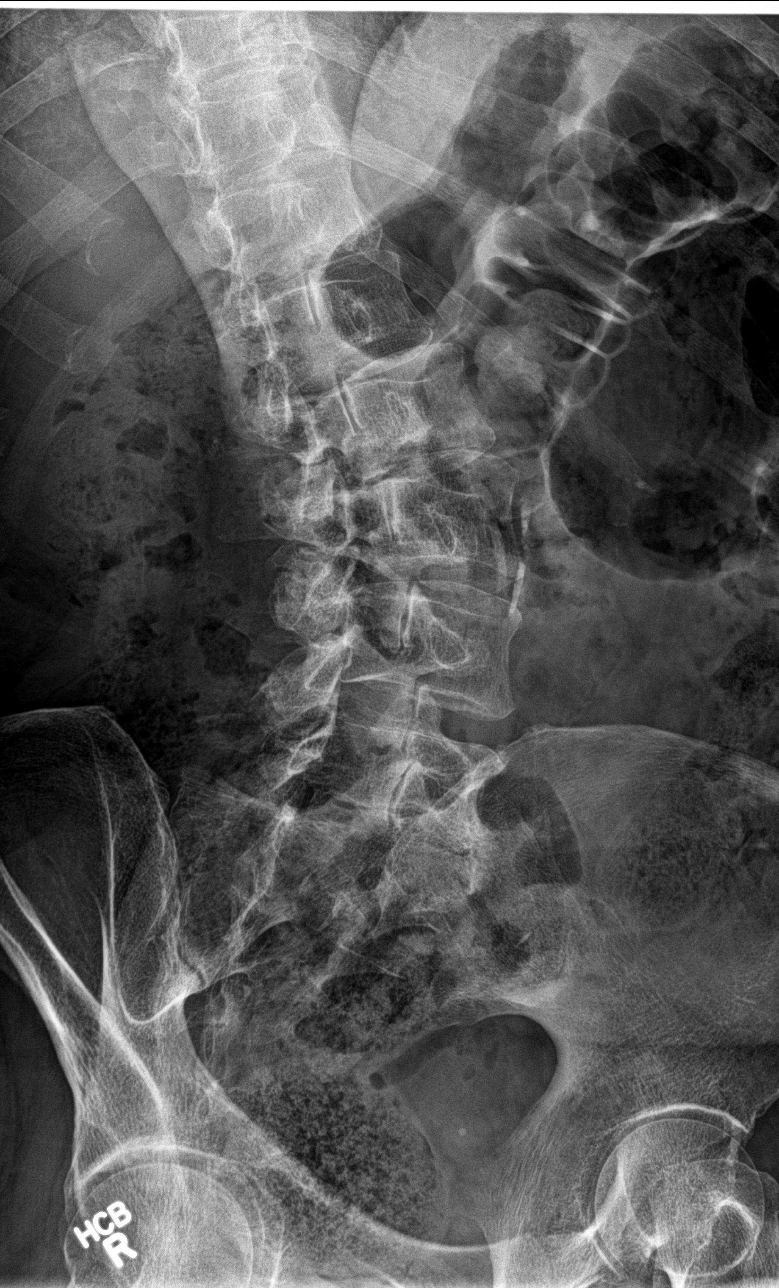

[l-spine lat]
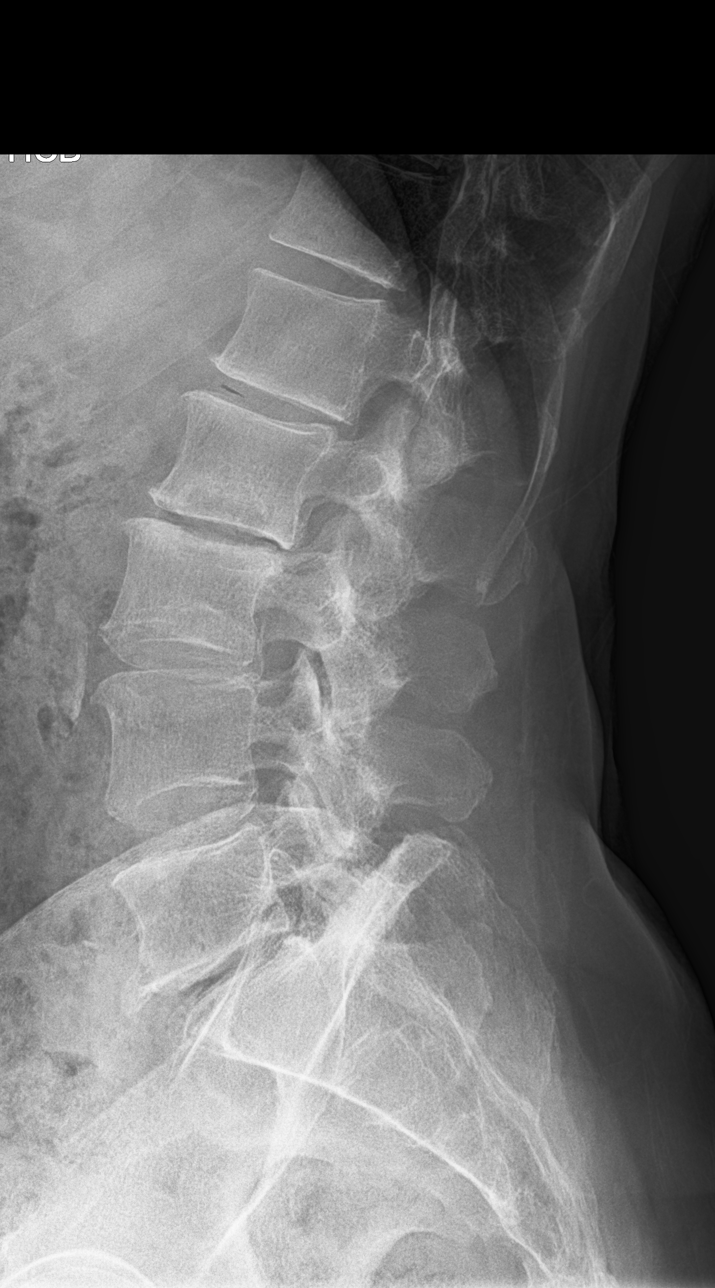

[l-spine spot]
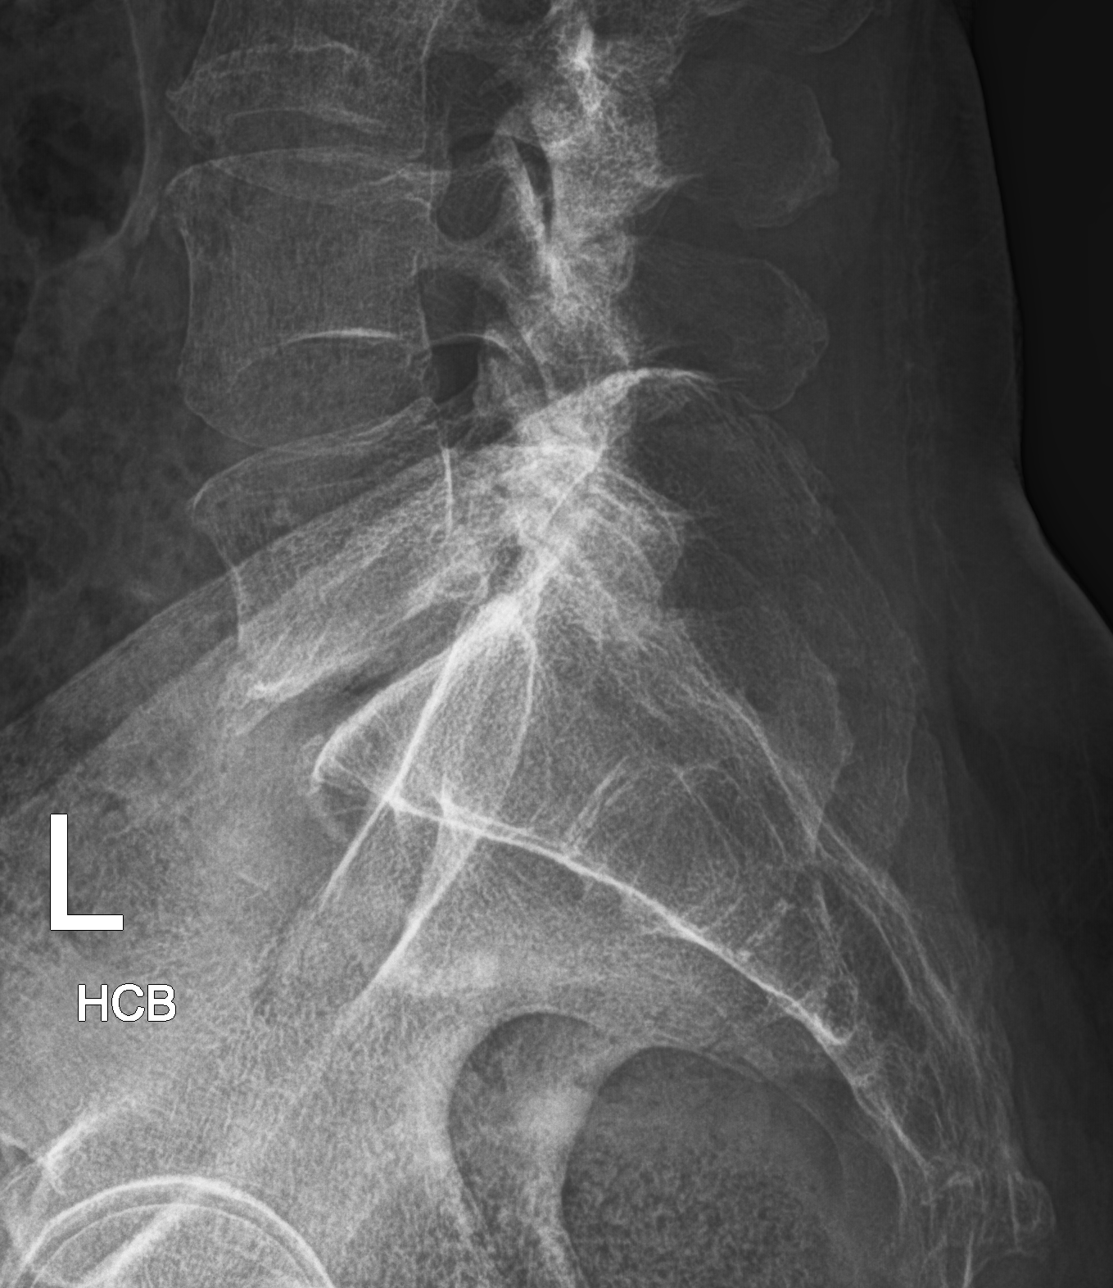

[5 of 5 positions shown; findings below may reference images not displayed]

FINDINGS: Moderate dextroscoliosis of lumbar spine is noted. Diffuse
osteopenia is noted. No fracture or significant spondylolisthesis is
noted. Degenerative disc disease is noted at L1-2, L2-3, L3-4 and
L5-S1. Mild degenerative changes seen involving the left-sided
posterior facet joint at L5-S1.
IMPRESSION: Multilevel degenerative disc disease. Moderate dextroscoliosis is
noted. No acute abnormality seen in the lumbar spine

## 2016-03-29 IMAGING — CR BILATERAL SACROILIAC JOINTS - 3+ VIEW
3 series · 3 of 3 positions shown · non-contrast
Comparison: None.

CLINICAL DATA: Pain radiating down the right leg

EXAM:
BILATERAL SACROILIAC JOINTS - 3+ VIEW

[si joints ap]
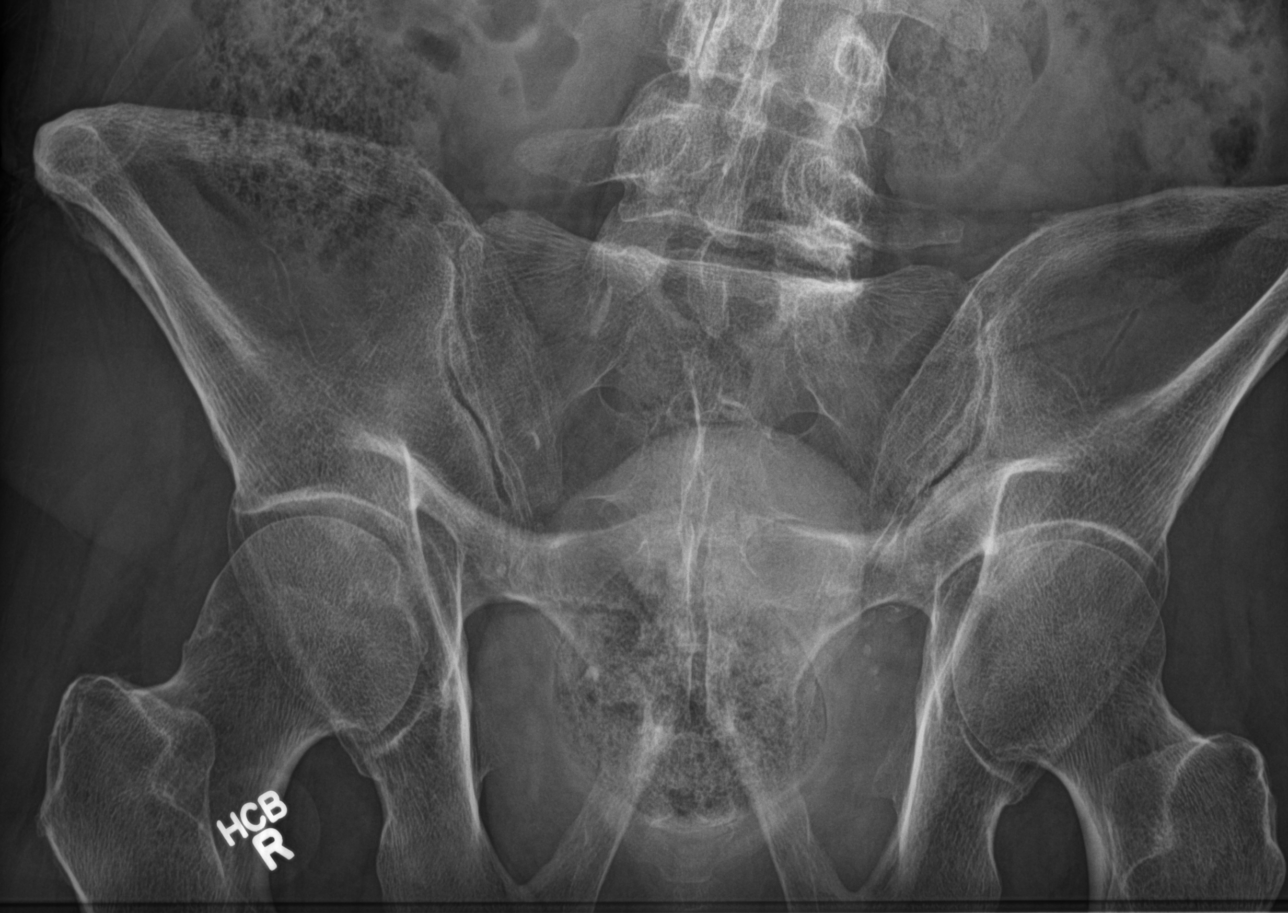

[si joints obli (1 of 2)]
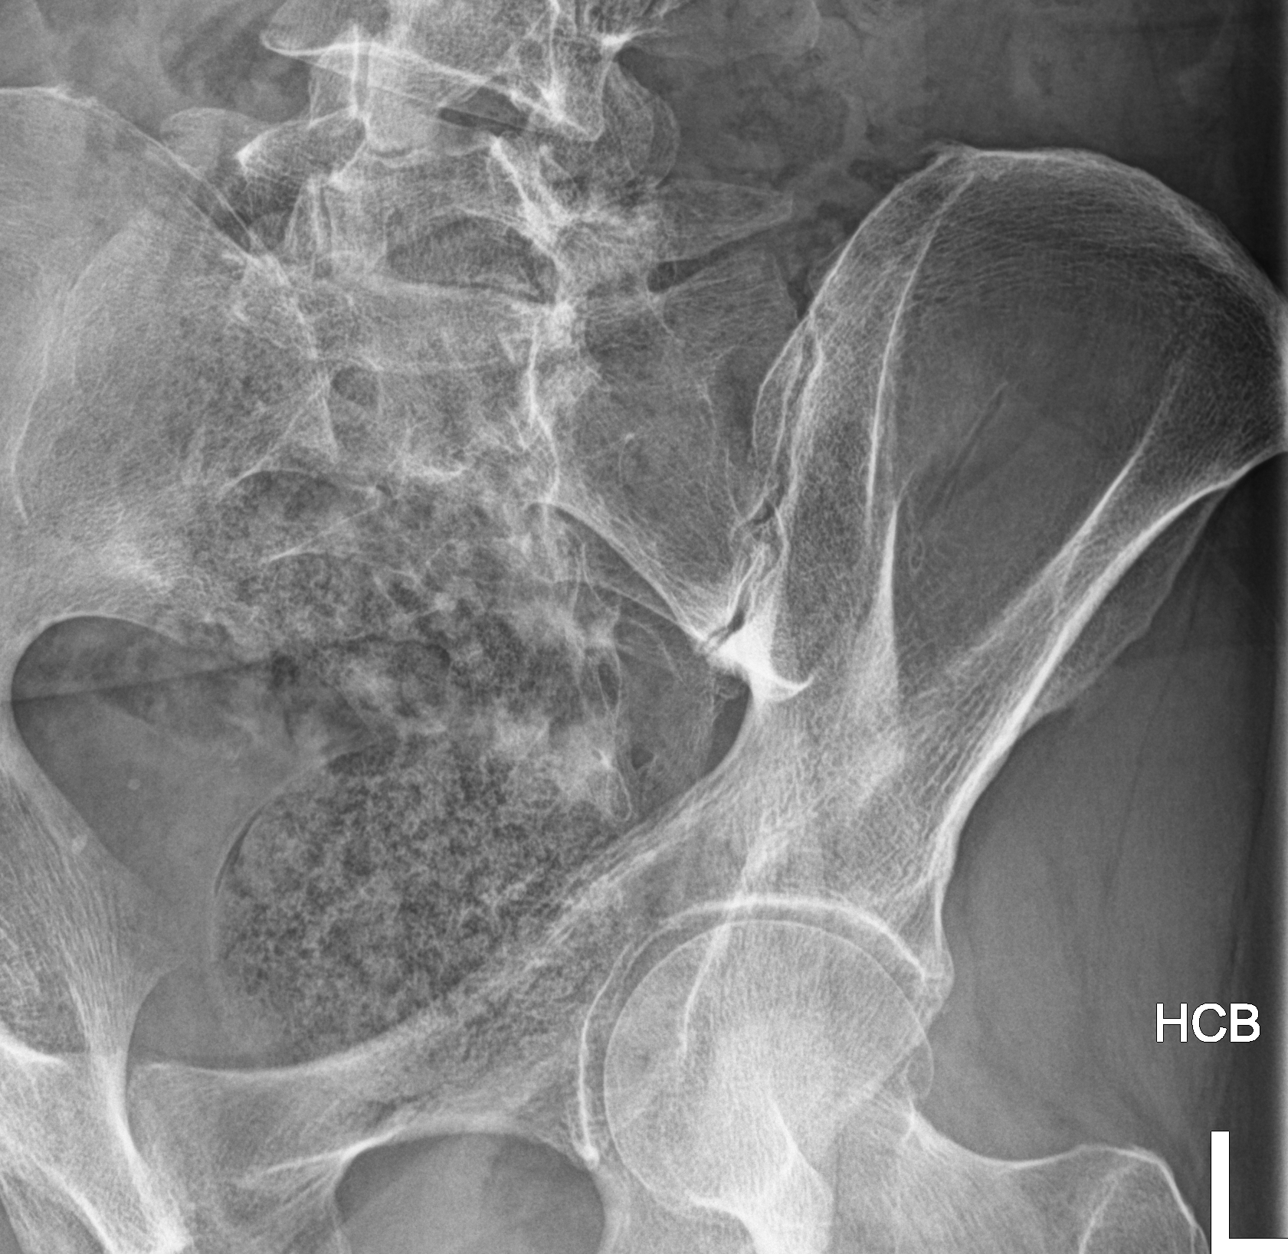

[si joints obli (2 of 2)]
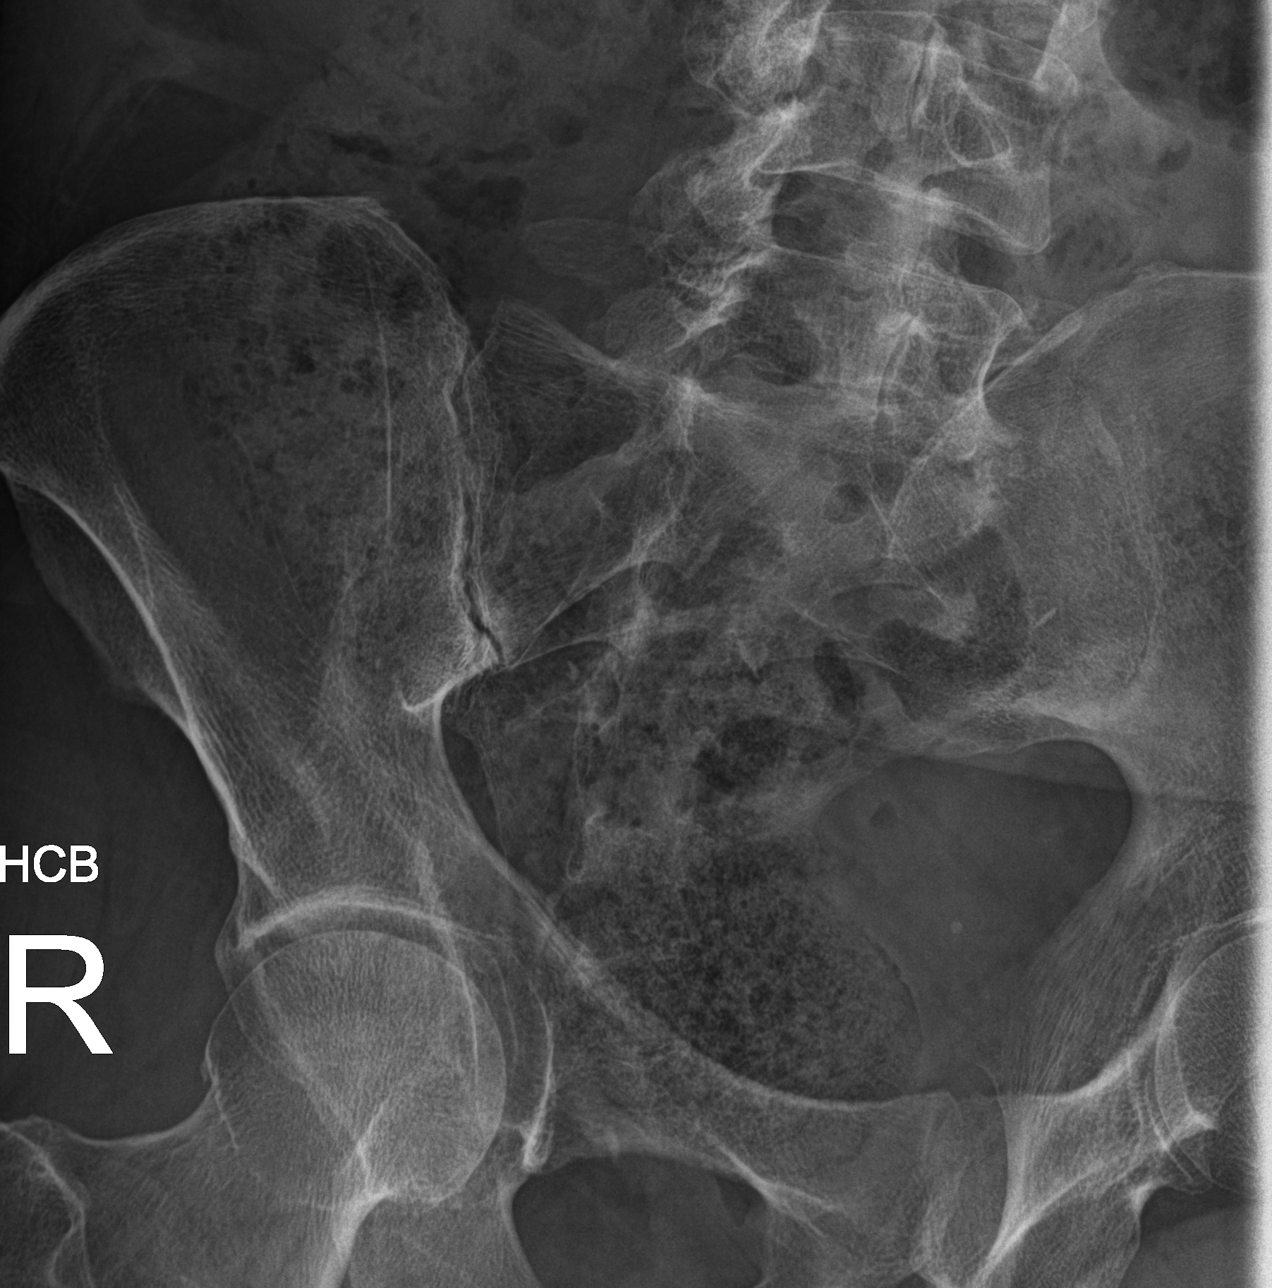

[3 of 3 positions shown; findings below may reference images not displayed]

FINDINGS: The sacroiliac joint spaces are maintained and there is no evidence
of arthropathy. There is generalized osteopenia. There is no lytic
or sclerotic osseous lesion.
IMPRESSION: No acute osseous injury of the SI joints.

## 2016-04-02 DIAGNOSIS — Z85828 Personal history of other malignant neoplasm of skin: Secondary | ICD-10-CM | POA: Diagnosis not present

## 2016-04-02 DIAGNOSIS — D225 Melanocytic nevi of trunk: Secondary | ICD-10-CM | POA: Diagnosis not present

## 2016-04-02 DIAGNOSIS — M4725 Other spondylosis with radiculopathy, thoracolumbar region: Secondary | ICD-10-CM | POA: Diagnosis not present

## 2016-04-02 DIAGNOSIS — L570 Actinic keratosis: Secondary | ICD-10-CM | POA: Diagnosis not present

## 2016-04-02 DIAGNOSIS — X32XXXA Exposure to sunlight, initial encounter: Secondary | ICD-10-CM | POA: Diagnosis not present

## 2016-04-02 DIAGNOSIS — D2261 Melanocytic nevi of right upper limb, including shoulder: Secondary | ICD-10-CM | POA: Diagnosis not present

## 2016-04-02 DIAGNOSIS — D2272 Melanocytic nevi of left lower limb, including hip: Secondary | ICD-10-CM | POA: Diagnosis not present

## 2016-04-19 ENCOUNTER — Encounter: Payer: Self-pay | Admitting: Family Medicine

## 2016-04-19 ENCOUNTER — Ambulatory Visit (INDEPENDENT_AMBULATORY_CARE_PROVIDER_SITE_OTHER): Payer: Medicare Other | Admitting: Family Medicine

## 2016-04-19 VITALS — BP 110/70 | HR 80 | Ht 70.0 in | Wt 165.0 lb

## 2016-04-19 DIAGNOSIS — M5030 Other cervical disc degeneration, unspecified cervical region: Secondary | ICD-10-CM

## 2016-04-19 DIAGNOSIS — N400 Enlarged prostate without lower urinary tract symptoms: Secondary | ICD-10-CM

## 2016-04-19 DIAGNOSIS — I10 Essential (primary) hypertension: Secondary | ICD-10-CM

## 2016-04-19 DIAGNOSIS — G43909 Migraine, unspecified, not intractable, without status migrainosus: Secondary | ICD-10-CM | POA: Diagnosis not present

## 2016-04-19 MED ORDER — BUTALBITAL-ASPIRIN-CAFFEINE 50-325-40 MG PO CAPS: 1.0000 | ORAL_CAPSULE | ORAL | 5 refills | Status: DC | PRN

## 2016-04-19 MED ORDER — CYCLOBENZAPRINE HCL 10 MG PO TABS: 10.0000 mg | ORAL_TABLET | Freq: Three times a day (TID) | ORAL | 1 refills | Status: DC | PRN

## 2016-04-19 MED ORDER — AMLODIPINE BESYLATE 2.5 MG PO TABS: 2.5000 mg | ORAL_TABLET | Freq: Every day | ORAL | 3 refills | Status: DC

## 2016-04-19 MED ORDER — DUTASTERIDE 0.5 MG PO CAPS: 0.5000 mg | ORAL_CAPSULE | ORAL | 4 refills | Status: DC

## 2016-04-19 MED ORDER — LISINOPRIL 20 MG PO TABS: 20.0000 mg | ORAL_TABLET | Freq: Every day | ORAL | 1 refills | Status: DC

## 2016-04-19 MED ORDER — PROPRANOLOL HCL ER 80 MG PO CP24: 160.0000 mg | ORAL_CAPSULE | Freq: Every day | ORAL | 6 refills | Status: DC

## 2016-04-19 NOTE — Addendum Note (Signed)
Addended by: Juline Patch on: 04/19/2016 09:23 AM   Modules accepted: Level of Service

## 2016-04-19 NOTE — Progress Notes (Addendum)
Name: Marcus Gonzalez   MRN: [id number]    DOB: [birth year]   Date:04/19/2016       Progress Note  Subjective  Chief Complaint  Chief Complaint  Patient presents with  . Hypertension  . Migraine  . psa check    was told you would check psa this visit    Hypertension  This is a chronic problem. The current episode started more than 1 year ago. The problem has been gradually improving since onset. The problem is controlled. Pertinent negatives include no anxiety, blurred vision, chest pain, headaches, malaise/fatigue, neck pain, orthopnea, palpitations, peripheral edema, PND, shortness of breath or sweats. There are no associated agents to hypertension. There are no known risk factors for coronary artery disease. Past treatments include ACE inhibitors and calcium channel blockers. The current treatment provides mild improvement. There are no compliance problems.  There is no history of angina, kidney disease, CVA, heart failure, left ventricular hypertrophy, PVD or retinopathy. There is no history of chronic renal disease, a hypertension causing med or renovascular disease.  Migraine   This is a chronic problem. The current episode started more than 1 year ago. The problem occurs intermittently. The problem has been unchanged. Pertinent negatives include no abdominal pain, back pain, blurred vision, coughing, dizziness, ear pain, fever, insomnia, nausea, neck pain, sore throat, tingling or weight loss. Treatments tried: butalbital. His past medical history is significant for hypertension.    No problem-specific Assessment & Plan notes found for this encounter.   Past Medical History:  Diagnosis Date  . BPH (benign prostatic hyperplasia)   . Degenerative disc disease, cervical   . Degenerative disc disease, cervical   . Headache   . Hip pain   . Migraine   . Migraine     Past Surgical History:  Procedure Laterality Date  . BASAL CELL CARCINOMA EXCISION     arm and leg  . COLONOSCOPY   2013   cleared for 5 yrs- Dr Allen Norris  . skin ca removed      Family History  Problem Relation Age of Onset  . Heart disease Father   . Diabetes Maternal Aunt   . Cancer Maternal Grandmother     Social History   Social History  . Marital status: Single    Spouse name: N/A  . Number of children: N/A  . Years of education: N/A   Occupational History  . Not on file.   Social History Main Topics  . Smoking status: Never Smoker  . Smokeless tobacco: Never Used  . Alcohol use 0.0 oz/week  . Drug use: No  . Sexual activity: Yes   Other Topics Concern  . Not on file   Social History Narrative  . No narrative on file    Allergies  Allergen Reactions  . Cephalexin     Outpatient Medications Prior to Visit  Medication Sig Dispense Refill  . etodolac (LODINE) 500 MG tablet Take 1 tablet (500 mg total) by mouth 2 (two) times daily. 60 tablet 11  . amLODipine (NORVASC) 5 MG tablet TAKE 1 TABLET BY MOUTH DAILY 90 tablet 0  . butalbital-aspirin-caffeine (FIORINAL) 50-325-40 MG capsule Take 1 capsule by mouth as needed. 14 capsule 5  . cyclobenzaprine (FLEXERIL) 10 MG tablet Take 1 tablet (10 mg total) by mouth 3 (three) times daily as needed for muscle spasms. 90 tablet 1  . dutasteride (AVODART) 0.5 MG capsule Take 1 capsule by mouth every 3 (three) days. Dr Jacqlyn Larsen    .  lisinopril (PRINIVIL,ZESTRIL) 20 MG tablet Take 1 tablet (20 mg total) by mouth daily. 90 tablet 1  . propranolol ER (INDERAL LA) 80 MG 24 hr capsule Take 2 capsules (160 mg total) by mouth daily. (Patient taking differently: Take 80 mg by mouth daily. ) 60 capsule 6   No facility-administered medications prior to visit.     Review of Systems  Constitutional: Negative for chills, fever, malaise/fatigue and weight loss.  HENT: Negative for ear discharge, ear pain and sore throat.   Eyes: Negative for blurred vision.  Respiratory: Negative for cough, sputum production, shortness of breath and wheezing.    Cardiovascular: Negative for chest pain, palpitations, orthopnea, leg swelling and PND.  Gastrointestinal: Negative for abdominal pain, blood in stool, constipation, diarrhea, heartburn, melena and nausea.  Genitourinary: Negative for dysuria, frequency, hematuria and urgency.  Musculoskeletal: Negative for back pain, joint pain, myalgias and neck pain.  Skin: Negative for rash.  Neurological: Negative for dizziness, tingling, sensory change, focal weakness and headaches.  Endo/Heme/Allergies: Negative for environmental allergies and polydipsia. Does not bruise/bleed easily.  Psychiatric/Behavioral: Negative for depression and suicidal ideas. The patient is not nervous/anxious and does not have insomnia.      Objective  Vitals:   04/19/16 0822  BP: 110/70  Pulse: 80  Weight: 165 lb (74.8 kg)  Height: 5\' 10"  (1.778 m)    Physical Exam  Constitutional: He is oriented to person, place, and time and well-developed, well-nourished, and in no distress.  HENT:  Head: Normocephalic.  Right Ear: External ear normal.  Left Ear: External ear normal.  Nose: Nose normal.  Mouth/Throat: Oropharynx is clear and moist.  Eyes: Conjunctivae and EOM are normal. Pupils are equal, round, and reactive to light. Right eye exhibits no discharge. Left eye exhibits no discharge. No scleral icterus.  Neck: Normal range of motion. Neck supple. No JVD present. No tracheal deviation present. No thyromegaly present.  Cardiovascular: Normal rate, regular rhythm, normal heart sounds and intact distal pulses.  Exam reveals no gallop and no friction rub.   No murmur heard. Pulmonary/Chest: Breath sounds normal. No respiratory distress. He has no wheezes. He has no rales.  Abdominal: Soft. Bowel sounds are normal. He exhibits no mass. There is no hepatosplenomegaly. There is no tenderness. There is no rebound, no guarding and no CVA tenderness.  Musculoskeletal: Normal range of motion. He exhibits no edema or  tenderness.  Lymphadenopathy:    He has no cervical adenopathy.  Neurological: He is alert and oriented to person, place, and time. He has normal sensation, normal strength, normal reflexes and intact cranial nerves. No cranial nerve deficit.  Skin: Skin is warm. No rash noted.  Psychiatric: Mood and affect normal.  Nursing note and vitals reviewed.     Assessment & Plan  Problem List Items Addressed This Visit      Cardiovascular and Mediastinum   Migraine without status migrainosus, not intractable   Relevant Medications   lisinopril (PRINIVIL,ZESTRIL) 20 MG tablet   propranolol ER (INDERAL LA) 80 MG 24 hr capsule   butalbital-aspirin-caffeine (FIORINAL) 50-325-40 MG capsule   cyclobenzaprine (FLEXERIL) 10 MG tablet   amLODipine (NORVASC) 2.5 MG tablet   Essential hypertension - Primary   Relevant Medications   lisinopril (PRINIVIL,ZESTRIL) 20 MG tablet   propranolol ER (INDERAL LA) 80 MG 24 hr capsule   amLODipine (NORVASC) 2.5 MG tablet     Musculoskeletal and Integument   Degeneration, intervertebral disc, cervical   Relevant Medications   butalbital-aspirin-caffeine (FIORINAL) 57-846-96  MG capsule   cyclobenzaprine (FLEXERIL) 10 MG tablet     Genitourinary   Benign prostatic hyperplasia without lower urinary tract symptoms   Relevant Medications   dutasteride (AVODART) 0.5 MG capsule      Meds ordered this encounter  Medications  . lisinopril (PRINIVIL,ZESTRIL) 20 MG tablet    Sig: Take 1 tablet (20 mg total) by mouth daily.    Dispense:  90 tablet    Refill:  1  . propranolol ER (INDERAL LA) 80 MG 24 hr capsule    Sig: Take 2 capsules (160 mg total) by mouth daily.    Dispense:  180 capsule    Refill:  6  . dutasteride (AVODART) 0.5 MG capsule    Sig: Take 1 capsule (0.5 mg total) by mouth every 3 (three) days. Dr Jacqlyn Larsen    Dispense:  30 capsule    Refill:  4  . butalbital-aspirin-caffeine (FIORINAL) 50-325-40 MG capsule    Sig: Take 1 capsule by mouth as  needed.    Dispense:  14 capsule    Refill:  5  . cyclobenzaprine (FLEXERIL) 10 MG tablet    Sig: Take 1 tablet (10 mg total) by mouth 3 (three) times daily as needed for muscle spasms.    Dispense:  90 tablet    Refill:  1    Switching from Mobic  Back to this  . amLODipine (NORVASC) 2.5 MG tablet    Sig: Take 1 tablet (2.5 mg total) by mouth daily.    Dispense:  90 tablet    Refill:  3      Dr. Otilio Miu [facility]  04/19/16

## 2016-04-19 NOTE — Addendum Note (Signed)
Addended by: Fredderick Severance on: 04/19/2016 09:11 AM   Modules accepted: Orders

## 2016-04-20 LAB — PSA, TOTAL AND FREE
PROSTATE SPECIFIC AG, SERUM: 3.5 ng/mL (ref 0.0–4.0)
PSA FREE PCT: 9.1 %
PSA FREE: 0.32 ng/mL

## 2016-06-07 DIAGNOSIS — C44229 Squamous cell carcinoma of skin of left ear and external auricular canal: Secondary | ICD-10-CM | POA: Diagnosis not present

## 2016-06-07 DIAGNOSIS — C44212 Basal cell carcinoma of skin of right ear and external auricular canal: Secondary | ICD-10-CM | POA: Diagnosis not present

## 2016-06-30 ENCOUNTER — Other Ambulatory Visit: Payer: Self-pay | Admitting: Family Medicine

## 2016-06-30 DIAGNOSIS — M2550 Pain in unspecified joint: Secondary | ICD-10-CM

## 2016-07-01 ENCOUNTER — Other Ambulatory Visit: Payer: Self-pay | Admitting: Family Medicine

## 2016-07-01 DIAGNOSIS — M2550 Pain in unspecified joint: Secondary | ICD-10-CM

## 2016-10-03 DIAGNOSIS — L570 Actinic keratosis: Secondary | ICD-10-CM | POA: Diagnosis not present

## 2016-10-03 DIAGNOSIS — D2261 Melanocytic nevi of right upper limb, including shoulder: Secondary | ICD-10-CM | POA: Diagnosis not present

## 2016-10-03 DIAGNOSIS — D225 Melanocytic nevi of trunk: Secondary | ICD-10-CM | POA: Diagnosis not present

## 2016-10-03 DIAGNOSIS — Z85828 Personal history of other malignant neoplasm of skin: Secondary | ICD-10-CM | POA: Diagnosis not present

## 2016-10-03 DIAGNOSIS — X32XXXA Exposure to sunlight, initial encounter: Secondary | ICD-10-CM | POA: Diagnosis not present

## 2016-10-03 DIAGNOSIS — D2272 Melanocytic nevi of left lower limb, including hip: Secondary | ICD-10-CM | POA: Diagnosis not present

## 2016-10-18 ENCOUNTER — Encounter: Payer: Medicare Other | Admitting: Family Medicine

## 2016-11-03 ENCOUNTER — Other Ambulatory Visit: Payer: Self-pay | Admitting: Family Medicine

## 2016-11-03 DIAGNOSIS — M2550 Pain in unspecified joint: Secondary | ICD-10-CM

## 2016-11-27 ENCOUNTER — Telehealth: Payer: Self-pay | Admitting: Family Medicine

## 2016-11-27 NOTE — Telephone Encounter (Signed)
November 1st AWV-I  Called pt to sched for AWV with Nurse Health Advisor cb #  [phone]  Jill Alexanders

## 2016-11-29 ENCOUNTER — Ambulatory Visit (INDEPENDENT_AMBULATORY_CARE_PROVIDER_SITE_OTHER): Payer: Medicare Other | Admitting: Family Medicine

## 2016-11-29 ENCOUNTER — Encounter: Payer: Self-pay | Admitting: Family Medicine

## 2016-11-29 VITALS — BP 130/88 | HR 76 | Ht 70.0 in | Wt 169.0 lb

## 2016-11-29 DIAGNOSIS — Z1211 Encounter for screening for malignant neoplasm of colon: Secondary | ICD-10-CM | POA: Diagnosis not present

## 2016-11-29 DIAGNOSIS — Z23 Encounter for immunization: Secondary | ICD-10-CM | POA: Diagnosis not present

## 2016-11-29 DIAGNOSIS — M5030 Other cervical disc degeneration, unspecified cervical region: Secondary | ICD-10-CM | POA: Diagnosis not present

## 2016-11-29 DIAGNOSIS — I10 Essential (primary) hypertension: Secondary | ICD-10-CM | POA: Diagnosis not present

## 2016-11-29 DIAGNOSIS — M25559 Pain in unspecified hip: Secondary | ICD-10-CM

## 2016-11-29 DIAGNOSIS — G43909 Migraine, unspecified, not intractable, without status migrainosus: Secondary | ICD-10-CM

## 2016-11-29 DIAGNOSIS — E785 Hyperlipidemia, unspecified: Secondary | ICD-10-CM

## 2016-11-29 DIAGNOSIS — Z0000 Encounter for general adult medical examination without abnormal findings: Secondary | ICD-10-CM

## 2016-11-29 DIAGNOSIS — N400 Enlarged prostate without lower urinary tract symptoms: Secondary | ICD-10-CM | POA: Diagnosis not present

## 2016-11-29 LAB — HEMOCCULT GUIAC POC 1CARD (OFFICE): FECAL OCCULT BLD: NEGATIVE

## 2016-11-29 MED ORDER — ETODOLAC 500 MG PO TABS: 500.0000 mg | ORAL_TABLET | Freq: Two times a day (BID) | ORAL | 1 refills | Status: DC

## 2016-11-29 MED ORDER — DUTASTERIDE 0.5 MG PO CAPS: 0.5000 mg | ORAL_CAPSULE | ORAL | 6 refills | Status: DC

## 2016-11-29 MED ORDER — AMLODIPINE BESYLATE 2.5 MG PO TABS: 2.5000 mg | ORAL_TABLET | Freq: Every day | ORAL | 3 refills | Status: DC

## 2016-11-29 MED ORDER — PROPRANOLOL HCL ER 80 MG PO CP24: 160.0000 mg | ORAL_CAPSULE | Freq: Every day | ORAL | 6 refills | Status: DC

## 2016-11-29 MED ORDER — CYCLOBENZAPRINE HCL 10 MG PO TABS: 10.0000 mg | ORAL_TABLET | Freq: Three times a day (TID) | ORAL | 1 refills | Status: DC | PRN

## 2016-11-29 MED ORDER — LISINOPRIL 20 MG PO TABS: 20.0000 mg | ORAL_TABLET | Freq: Every day | ORAL | 1 refills | Status: DC

## 2016-11-29 MED ORDER — BUTALBITAL-ASPIRIN-CAFFEINE 50-325-40 MG PO CAPS: 1.0000 | ORAL_CAPSULE | ORAL | 5 refills | Status: DC | PRN

## 2016-11-29 MED ORDER — PROPRANOLOL HCL ER 80 MG PO CP24: 80.0000 mg | ORAL_CAPSULE | Freq: Every day | ORAL | 1 refills | Status: DC

## 2016-11-29 NOTE — Progress Notes (Signed)
Name: Marcus Gonzalez   MRN: [id number]    DOB: Jun 24, 1950   Date:11/29/2016       Progress Note  Subjective  Chief Complaint  Chief Complaint  Patient presents with  . Headache  . Benign Prostatic Hypertrophy  . Arthritis    hip- bursa  . Hypertension  . Annual Exam    Patient presents for annual physical exam.   Headache   This is a chronic problem. The current episode started more than 1 year ago. The problem has been unchanged. Pertinent negatives include no abdominal pain, back pain, blurred vision, coughing, dizziness, ear pain, fever, insomnia, nausea, neck pain, sore throat, tingling or weight loss. The treatment provided significant relief. His past medical history is significant for hypertension and migraine headaches.  Benign Prostatic Hypertrophy  This is a chronic problem. The current episode started more than 1 year ago. The problem has been waxing and waning since onset. Irritative symptoms do not include frequency, nocturia or urgency. Obstructive symptoms do not include dribbling, an intermittent stream, a slower stream, straining or a weak stream. Pertinent negatives include no chills, dysuria, hematuria or nausea. Past treatments include finasteride. The treatment provided mild relief.  Arthritis  Presents for follow-up visit. He reports no pain, stiffness, joint swelling or joint warmth. The symptoms have been stable. Pertinent negatives include no diarrhea, dysuria, fever, rash or weight loss.  Hypertension  This is a chronic problem. The current episode started more than 1 year ago. The problem is controlled. Associated symptoms include headaches. Pertinent negatives include no anxiety, blurred vision, chest pain, malaise/fatigue, neck pain, orthopnea, palpitations, peripheral edema, PND, shortness of breath or sweats. There are no associated agents to hypertension. There are no compliance problems.  There is no history of angina, kidney disease, CAD/MI or CVA.    No  problem-specific Assessment & Plan notes found for this encounter.   Past Medical History:  Diagnosis Date  . BPH (benign prostatic hyperplasia)   . Degenerative disc disease, cervical   . Degenerative disc disease, cervical   . Headache   . Hip pain   . Migraine   . Migraine     Past Surgical History:  Procedure Laterality Date  . BASAL CELL CARCINOMA EXCISION     arm and leg  . COLONOSCOPY  2013   cleared for 5 yrs- Dr Allen Norris  . skin ca removed      Family History  Problem Relation Age of Onset  . Heart disease Father   . Diabetes Maternal Aunt   . Cancer Maternal Grandmother     Social History   Social History  . Marital status: Single    Spouse name: N/A  . Number of children: N/A  . Years of education: N/A   Occupational History  . Not on file.   Social History Main Topics  . Smoking status: Never Smoker  . Smokeless tobacco: Never Used  . Alcohol use 0.0 oz/week  . Drug use: No  . Sexual activity: Yes   Other Topics Concern  . Not on file   Social History Narrative  . No narrative on file    Allergies  Allergen Reactions  . Cephalexin     Outpatient Medications Prior to Visit  Medication Sig Dispense Refill  . amLODipine (NORVASC) 2.5 MG tablet Take 1 tablet (2.5 mg total) by mouth daily. 90 tablet 3  . cyclobenzaprine (FLEXERIL) 10 MG tablet Take 1 tablet (10 mg total) by mouth 3 (three) times daily as  needed for muscle spasms. 90 tablet 1  . dutasteride (AVODART) 0.5 MG capsule Take 1 capsule (0.5 mg total) by mouth every 3 (three) days. Dr Jacqlyn Larsen 30 capsule 4  . etodolac (LODINE) 500 MG tablet TAKE 1 TABLET BY MOUTH TWICE DAILY 60 tablet 0  . lisinopril (PRINIVIL,ZESTRIL) 20 MG tablet Take 1 tablet (20 mg total) by mouth daily. 90 tablet 1  . propranolol ER (INDERAL LA) 80 MG 24 hr capsule Take 2 capsules (160 mg total) by mouth daily. 180 capsule 6  . butalbital-aspirin-caffeine (FIORINAL) 50-325-40 MG capsule Take 1 capsule by mouth as  needed. (Patient not taking: Reported on 11/29/2016) 14 capsule 5  . etodolac (LODINE) 500 MG tablet TAKE 1 TABLET BY MOUTH TWICE DAILY 60 tablet 0   No facility-administered medications prior to visit.     Review of Systems  Constitutional: Negative for chills, fever, malaise/fatigue and weight loss.  HENT: Negative for ear discharge, ear pain and sore throat.   Eyes: Negative for blurred vision.  Respiratory: Negative for cough, sputum production, shortness of breath and wheezing.   Cardiovascular: Negative for chest pain, palpitations, orthopnea, leg swelling and PND.  Gastrointestinal: Negative for abdominal pain, blood in stool, constipation, diarrhea, heartburn, melena and nausea.  Genitourinary: Negative for dysuria, frequency, hematuria, nocturia and urgency.  Musculoskeletal: Positive for arthritis. Negative for back pain, joint pain, joint swelling, myalgias, neck pain and stiffness.  Skin: Negative for rash.  Neurological: Positive for headaches. Negative for dizziness, tingling, sensory change and focal weakness.  Endo/Heme/Allergies: Negative for environmental allergies and polydipsia. Does not bruise/bleed easily.  Psychiatric/Behavioral: Negative for depression and suicidal ideas. The patient is not nervous/anxious and does not have insomnia.      Objective  Vitals:   11/29/16 0836  BP: 130/88  Pulse: 76  Weight: 169 lb (76.7 kg)  Height: 5\' 10"  (1.778 m)    Physical Exam  Constitutional: He is oriented to person, place, and time and well-developed, well-nourished, and in no distress.  HENT:  Head: Normocephalic.  Right Ear: External ear normal.  Left Ear: External ear normal.  Nose: Nose normal.  Mouth/Throat: Oropharynx is clear and moist.  Eyes: Pupils are equal, round, and reactive to light. Conjunctivae and EOM are normal. Right eye exhibits no discharge. Left eye exhibits no discharge. No scleral icterus.  Neck: Normal range of motion. Neck supple. No JVD  present. No tracheal deviation present. No thyromegaly present.  Cardiovascular: Normal rate, regular rhythm, normal heart sounds and intact distal pulses.  Exam reveals no gallop and no friction rub.   No murmur heard. Pulmonary/Chest: Breath sounds normal. No respiratory distress. He has no wheezes. He has no rales.  Abdominal: Soft. Bowel sounds are normal. He exhibits no mass. There is no hepatosplenomegaly. There is no tenderness. There is no rebound, no guarding and no CVA tenderness.  Musculoskeletal: Normal range of motion. He exhibits no edema or tenderness.  Lymphadenopathy:    He has no cervical adenopathy.  Neurological: He is alert and oriented to person, place, and time. He has normal sensation, normal strength, normal reflexes and intact cranial nerves. No cranial nerve deficit.  Skin: Skin is warm. No rash noted.  Psychiatric: Mood and affect normal.  Nursing note and vitals reviewed.     Assessment & Plan  Problem List Items Addressed This Visit      Cardiovascular and Mediastinum   Migraine without status migrainosus, not intractable   Relevant Medications   cyclobenzaprine (FLEXERIL) 10 MG tablet  amLODipine (NORVASC) 2.5 MG tablet   lisinopril (PRINIVIL,ZESTRIL) 20 MG tablet   butalbital-aspirin-caffeine (FIORINAL) 50-325-40 MG capsule   etodolac (LODINE) 500 MG tablet   propranolol ER (INDERAL LA) 80 MG 24 hr capsule   Essential hypertension   Relevant Medications   amLODipine (NORVASC) 2.5 MG tablet   lisinopril (PRINIVIL,ZESTRIL) 20 MG tablet   propranolol ER (INDERAL LA) 80 MG 24 hr capsule   Other Relevant Orders   Renal Function Panel     Musculoskeletal and Integument   Degeneration, intervertebral disc, cervical   Relevant Medications   cyclobenzaprine (FLEXERIL) 10 MG tablet   butalbital-aspirin-caffeine (FIORINAL) 50-325-40 MG capsule   etodolac (LODINE) 500 MG tablet     Genitourinary   Benign prostatic hyperplasia without lower urinary  tract symptoms   Relevant Medications   dutasteride (AVODART) 0.5 MG capsule   Other Relevant Orders   PSA     Other   Hyperlipidemia   Relevant Medications   amLODipine (NORVASC) 2.5 MG tablet   lisinopril (PRINIVIL,ZESTRIL) 20 MG tablet   propranolol ER (INDERAL LA) 80 MG 24 hr capsule   Other Relevant Orders   Lipid Profile    Other Visit Diagnoses    Annual physical exam    -  Primary   Influenza vaccine needed       Relevant Orders   Flu vaccine HIGH DOSE PF (Completed)   Colon cancer screening       Relevant Orders   POCT occult blood stool (Completed)   Arthralgia of hip, unspecified laterality       Relevant Medications   etodolac (LODINE) 500 MG tablet      Meds ordered this encounter  Medications  . dutasteride (AVODART) 0.5 MG capsule    Sig: Take 1 capsule (0.5 mg total) by mouth every 3 (three) days. Dr Jacqlyn Larsen    Dispense:  30 capsule    Refill:  6  . cyclobenzaprine (FLEXERIL) 10 MG tablet    Sig: Take 1 tablet (10 mg total) by mouth 3 (three) times daily as needed for muscle spasms.    Dispense:  90 tablet    Refill:  1    Switching from Mobic  Back to this  . amLODipine (NORVASC) 2.5 MG tablet    Sig: Take 1 tablet (2.5 mg total) by mouth daily.    Dispense:  90 tablet    Refill:  3  . lisinopril (PRINIVIL,ZESTRIL) 20 MG tablet    Sig: Take 1 tablet (20 mg total) by mouth daily.    Dispense:  90 tablet    Refill:  1  . DISCONTD: propranolol ER (INDERAL LA) 80 MG 24 hr capsule    Sig: Take 2 capsules (160 mg total) by mouth daily.    Dispense:  180 capsule    Refill:  6  . butalbital-aspirin-caffeine (FIORINAL) 50-325-40 MG capsule    Sig: Take 1 capsule by mouth as needed.    Dispense:  14 capsule    Refill:  5  . etodolac (LODINE) 500 MG tablet    Sig: Take 1 tablet (500 mg total) by mouth 2 (two) times daily.    Dispense:  180 tablet    Refill:  1    **Patient requests 90 days supply**  . propranolol ER (INDERAL LA) 80 MG 24 hr capsule     Sig: Take 1 capsule (80 mg total) by mouth daily.    Dispense:  90 capsule    Refill:  1  Dr. [facility]  11/29/16

## 2016-11-30 LAB — RENAL FUNCTION PANEL
Albumin: 4.6 g/dL (ref 3.6–4.8)
BUN / CREAT RATIO: 11 (ref 10–24)
BUN: 12 mg/dL (ref 8–27)
CO2: 25 mmol/L (ref 20–29)
CREATININE: 1.08 mg/dL (ref 0.76–1.27)
Calcium: 9.9 mg/dL (ref 8.6–10.2)
Chloride: 100 mmol/L (ref 96–106)
GFR, EST AFRICAN AMERICAN: 82 mL/min/{1.73_m2} (ref >59)
GFR, EST NON AFRICAN AMERICAN: 71 mL/min/{1.73_m2} (ref >59)
Glucose: 89 mg/dL (ref 65–99)
Phosphorus: 3.2 mg/dL (ref 2.5–4.5)
Potassium: 4.7 mmol/L (ref 3.5–5.2)
Sodium: 141 mmol/L (ref 134–144)

## 2016-11-30 LAB — LIPID PANEL
Chol/HDL Ratio: 2.3 ratio (ref 0.0–5.0)
Cholesterol, Total: 170 mg/dL (ref 100–199)
HDL: 73 mg/dL (ref >39)
LDL Calculated: 84 mg/dL (ref 0–99)
Triglycerides: 63 mg/dL (ref 0–149)
VLDL Cholesterol Cal: 13 mg/dL (ref 5–40)

## 2016-11-30 LAB — PSA: PROSTATE SPECIFIC AG, SERUM: 8.5 ng/mL — AB (ref 0.0–4.0)

## 2016-12-02 ENCOUNTER — Other Ambulatory Visit: Payer: Self-pay

## 2016-12-02 MED ORDER — SULFAMETHOXAZOLE-TRIMETHOPRIM 800-160 MG PO TABS
1.0000 | ORAL_TABLET | Freq: Two times a day (BID) | ORAL | 0 refills | Status: DC
Start: 2016-12-02 — End: 2017-05-02

## 2016-12-05 ENCOUNTER — Other Ambulatory Visit: Payer: Self-pay

## 2017-02-04 DIAGNOSIS — R339 Retention of urine, unspecified: Secondary | ICD-10-CM | POA: Diagnosis not present

## 2017-02-04 DIAGNOSIS — R972 Elevated prostate specific antigen [PSA]: Secondary | ICD-10-CM | POA: Diagnosis not present

## 2017-02-04 DIAGNOSIS — N401 Enlarged prostate with lower urinary tract symptoms: Secondary | ICD-10-CM | POA: Diagnosis not present

## 2017-02-04 DIAGNOSIS — Z6824 Body mass index (BMI) 24.0-24.9, adult: Secondary | ICD-10-CM | POA: Diagnosis not present

## 2017-02-04 DIAGNOSIS — N411 Chronic prostatitis: Secondary | ICD-10-CM | POA: Diagnosis not present

## 2017-02-04 DIAGNOSIS — N138 Other obstructive and reflux uropathy: Secondary | ICD-10-CM | POA: Diagnosis not present

## 2017-03-13 DIAGNOSIS — R972 Elevated prostate specific antigen [PSA]: Secondary | ICD-10-CM | POA: Diagnosis not present

## 2017-03-21 DIAGNOSIS — R972 Elevated prostate specific antigen [PSA]: Secondary | ICD-10-CM | POA: Diagnosis not present

## 2017-04-03 DIAGNOSIS — D485 Neoplasm of uncertain behavior of skin: Secondary | ICD-10-CM | POA: Diagnosis not present

## 2017-04-03 DIAGNOSIS — D2271 Melanocytic nevi of right lower limb, including hip: Secondary | ICD-10-CM | POA: Diagnosis not present

## 2017-04-03 DIAGNOSIS — L570 Actinic keratosis: Secondary | ICD-10-CM | POA: Diagnosis not present

## 2017-04-03 DIAGNOSIS — Z85828 Personal history of other malignant neoplasm of skin: Secondary | ICD-10-CM | POA: Diagnosis not present

## 2017-04-03 DIAGNOSIS — D2261 Melanocytic nevi of right upper limb, including shoulder: Secondary | ICD-10-CM | POA: Diagnosis not present

## 2017-04-03 DIAGNOSIS — C44519 Basal cell carcinoma of skin of other part of trunk: Secondary | ICD-10-CM | POA: Diagnosis not present

## 2017-04-03 DIAGNOSIS — X32XXXA Exposure to sunlight, initial encounter: Secondary | ICD-10-CM | POA: Diagnosis not present

## 2017-04-03 DIAGNOSIS — D2272 Melanocytic nevi of left lower limb, including hip: Secondary | ICD-10-CM | POA: Diagnosis not present

## 2017-05-02 ENCOUNTER — Ambulatory Visit (INDEPENDENT_AMBULATORY_CARE_PROVIDER_SITE_OTHER): Payer: Medicare Other | Admitting: Family Medicine

## 2017-05-02 ENCOUNTER — Encounter: Payer: Self-pay | Admitting: Family Medicine

## 2017-05-02 VITALS — BP 120/84 | HR 80 | Ht 70.0 in | Wt 170.0 lb

## 2017-05-02 DIAGNOSIS — M7702 Medial epicondylitis, left elbow: Secondary | ICD-10-CM

## 2017-05-02 DIAGNOSIS — M654 Radial styloid tenosynovitis [de Quervain]: Secondary | ICD-10-CM | POA: Diagnosis not present

## 2017-05-02 NOTE — Progress Notes (Signed)
Name: Marcus Gonzalez   MRN: [id number]    DOB: [birth year]   Date:05/02/2017       Progress Note  Subjective  Chief Complaint  Chief Complaint  Patient presents with  . thumb pain    both thumbs and L) elbow pain- takes etodolac, but is backing off because not wanting to take med    Hand Pain   There was no injury mechanism. The pain is present in the right hand, left elbow and left hand (thumbs/ elbow). The quality of the pain is described as aching. The pain is at a severity of 5/10. The pain is moderate. The pain has been fluctuating since the incident. Pertinent negatives include no chest pain or tingling. Nothing aggravates the symptoms. He has tried NSAIDs for the symptoms. The treatment provided moderate relief.  Arm Pain   The incident occurred more than 1 week ago. There was no injury mechanism. The pain is present in the left elbow. The quality of the pain is described as burning. The pain is at a severity of 4/10. Pertinent negatives include no chest pain or tingling.    No problem-specific Assessment & Plan notes found for this encounter.   Past Medical History:  Diagnosis Date  . BPH (benign prostatic hyperplasia)   . Degenerative disc disease, cervical   . Degenerative disc disease, cervical   . Headache   . Hip pain   . Migraine   . Migraine     Past Surgical History:  Procedure Laterality Date  . BASAL CELL CARCINOMA EXCISION     arm and leg  . COLONOSCOPY  2013   cleared for 5 yrs- Dr Allen Norris  . skin ca removed      Family History  Problem Relation Age of Onset  . Heart disease Father   . Diabetes Maternal Aunt   . Cancer Maternal Grandmother     Social History   Socioeconomic History  . Marital status: Single    Spouse name: Not on file  . Number of children: Not on file  . Years of education: Not on file  . Highest education level: Not on file  Occupational History  . Not on file  Social Needs  . Financial resource strain: Not on file  . Food  insecurity:    Worry: Not on file    Inability: Not on file  . Transportation needs:    Medical: Not on file    Non-medical: Not on file  Tobacco Use  . Smoking status: Never Smoker  . Smokeless tobacco: Never Used  Substance and Sexual Activity  . Alcohol use: Yes    Alcohol/week: 0.0 oz  . Drug use: No  . Sexual activity: Yes  Lifestyle  . Physical activity:    Days per week: Not on file    Minutes per session: Not on file  . Stress: Not on file  Relationships  . Social connections:    Talks on phone: Not on file    Gets together: Not on file    Attends religious service: Not on file    Active member of club or organization: Not on file    Attends meetings of clubs or organizations: Not on file    Relationship status: Not on file  . Intimate partner violence:    Fear of current or ex partner: Not on file    Emotionally abused: Not on file    Physically abused: Not on file    Forced sexual activity: Not  on file  Other Topics Concern  . Not on file  Social History Narrative  . Not on file    Allergies  Allergen Reactions  . Cephalexin     Outpatient Medications Prior to Visit  Medication Sig Dispense Refill  . amLODipine (NORVASC) 2.5 MG tablet Take 1 tablet (2.5 mg total) by mouth daily. 90 tablet 3  . cyclobenzaprine (FLEXERIL) 10 MG tablet Take 1 tablet (10 mg total) by mouth 3 (three) times daily as needed for muscle spasms. 90 tablet 1  . dutasteride (AVODART) 0.5 MG capsule Take 1 capsule (0.5 mg total) by mouth every 3 (three) days. Dr Jacqlyn Larsen 30 capsule 6  . etodolac (LODINE) 500 MG tablet Take 1 tablet (500 mg total) by mouth 2 (two) times daily. 180 tablet 1  . lisinopril (PRINIVIL,ZESTRIL) 20 MG tablet Take 1 tablet (20 mg total) by mouth daily. 90 tablet 1  . propranolol ER (INDERAL LA) 80 MG 24 hr capsule Take 1 capsule (80 mg total) by mouth daily. 90 capsule 1  . butalbital-aspirin-caffeine (FIORINAL) 50-325-40 MG capsule Take 1 capsule by mouth as  needed. 14 capsule 5  . sulfamethoxazole-trimethoprim (BACTRIM DS,SEPTRA DS) 800-160 MG tablet Take 1 tablet 2 (two) times daily by mouth. 28 tablet 0   No facility-administered medications prior to visit.     Review of Systems  Constitutional: Negative for chills, fever, malaise/fatigue and weight loss.  HENT: Negative for ear discharge, ear pain and sore throat.   Eyes: Negative for blurred vision.  Respiratory: Negative for cough, sputum production, shortness of breath and wheezing.   Cardiovascular: Negative for chest pain, palpitations and leg swelling.  Gastrointestinal: Negative for abdominal pain, blood in stool, constipation, diarrhea, heartburn, melena and nausea.  Genitourinary: Negative for dysuria, frequency, hematuria and urgency.  Musculoskeletal: Negative for back pain, joint pain, myalgias and neck pain.  Skin: Negative for rash.  Neurological: Negative for dizziness, tingling, sensory change, focal weakness and headaches.  Endo/Heme/Allergies: Negative for environmental allergies and polydipsia. Does not bruise/bleed easily.  Psychiatric/Behavioral: Negative for depression and suicidal ideas. The patient is not nervous/anxious and does not have insomnia.      Objective  Vitals:   05/02/17 0828  BP: 120/84  Pulse: 80  Weight: 170 lb (77.1 kg)  Height: 5\' 10"  (1.778 m)    Physical Exam  Constitutional: He is oriented to person, place, and time and well-developed, well-nourished, and in no distress.  HENT:  Head: Normocephalic.  Right Ear: External ear normal.  Left Ear: External ear normal.  Nose: Nose normal.  Mouth/Throat: Oropharynx is clear and moist.  Eyes: Pupils are equal, round, and reactive to light. Conjunctivae and EOM are normal. Right eye exhibits no discharge. Left eye exhibits no discharge. No scleral icterus.  Neck: Normal range of motion. Neck supple. No JVD present. No tracheal deviation present. No thyromegaly present.  Cardiovascular:  Normal rate, regular rhythm, normal heart sounds and intact distal pulses. Exam reveals no gallop and no friction rub.  No murmur heard. Pulmonary/Chest: Breath sounds normal. No respiratory distress. He has no wheezes. He has no rales. He exhibits no tenderness.  Abdominal: Soft. Bowel sounds are normal. He exhibits no mass. There is no hepatosplenomegaly. There is no tenderness. There is no rebound, no guarding and no CVA tenderness.  Musculoskeletal: Normal range of motion. He exhibits no edema.       Left elbow: Tenderness found. Medial epicondyle tenderness noted.       Right hand: He exhibits  tenderness. He exhibits no deformity. Normal sensation noted. Normal strength noted.       Left hand: He exhibits tenderness. He exhibits no swelling. Normal sensation noted. Normal strength noted.       Hands: Lymphadenopathy:    He has no cervical adenopathy.  Neurological: He is alert and oriented to person, place, and time. He has normal sensation, normal strength, normal reflexes and intact cranial nerves. No cranial nerve deficit.  Skin: Skin is warm. No rash noted.  Psychiatric: Mood and affect normal.  Nursing note and vitals reviewed.     Assessment & Plan  Problem List Items Addressed This Visit    None    Visit Diagnoses    De Quervain's disease (tenosynovitis)    -  Primary   Relevant Orders   Ambulatory referral to Orthopedic Surgery   Medial epicondylitis of left elbow       cont etodolac   Relevant Orders   Ambulatory referral to Orthopedic Surgery      No orders of the defined types were placed in this encounter.     Dr. Macon [facility]  05/02/17

## 2017-05-02 NOTE — Patient Instructions (Signed)
Alfonse Ras Disease Alfonse Ras disease is inflammation of the tendon on the thumb side of the wrist. Tendons are cords of tissue that connect bones to muscles. The tendons in your hand pass through a tunnel, or sheath. A slippery layer of tissue (synovium) lets the tendons move smoothly in the sheath. With de Quervain disease, the sheath swells or thickens, causing friction and pain. The condition is also called de Quervain tendinosis and de Quervain syndrome. It occurs most often in women who are 25-55 years old. What are the causes? The exact cause of de Quervain disease is not known. It may result from:  Overusing your hands, especially with repetitive motions that involve twisting your hand or using a forceful grip.  Pregnancy.  Rheumatoid disease.  What increases the risk? You may have a greater risk for de Quervain disease if you:  Are a middle-aged woman.  Are pregnant.  Have rheumatoid arthritis.  Have diabetes.  Use your hands far more than normal, especially with a tight grip or excessive twisting.  What are the signs or symptoms? Pain on the thumb side of your wrist is the main symptom of de Quervain disease. Other signs and symptoms include:  Pain that gets worse when you grasp something or turn your wrist.  Pain that extends up the forearm.  Cysts in the area of the pain.  Swelling of your wrist and hand.  A sensation of snapping in the wrist.  Trouble moving the thumb and wrist.  How is this diagnosed? Your health care provider may diagnose de Quervain disease based on your signs and symptoms. A physical exam will also be done. A simple test W[facility] [place] test) that involves pulling your thumb and wrist to see if this causes pain can help determine whether you have the condition. Sometimes you may need to have an X-ray. How is this treated? Avoiding any activity that causes pain and swelling is the best treatment. Other options include:  Wearing a  splint.  Taking medicine. Anti-inflammatory medicines and corticosteroid injections may reduce inflammation and relieve pain.  Having surgery if other treatments do not work.  Follow these instructions at home:  Using ice can be helpful after doing activities that involve the sore wrist. To apply ice to the injured area: ? Put ice in a plastic bag. ? Place a towel between your skin and the bag. ? Leave the ice on for 20 minutes, 2-3 times a day.  Take medicines only as directed by your health care provider.  Wear your splint as directed. This will allow your hand to rest and heal. Contact a health care provider if:  Your pain medicine does not help.  Your pain gets worse.  You develop new symptoms. This information is not intended to replace advice given to you by your health care provider. Make sure you discuss any questions you have with your health care provider. Document Released: 10/09/2000 Document Revised: 06/22/2015 Document Reviewed: 05/19/2013 Elsevier Interactive Patient Education  2018 [facility] Ask your health care provider which exercises are safe for you. Do exercises exactly as told by your health care provider and adjust them as directed. It is normal to feel mild stretching, pulling, tightness, or discomfort as you do these exercises, but you should stop right away if you feel sudden pain or your pain gets worse. Do not begin these exercises until told by your health care provider. Stretching and range of motion exercises These exercises warm up your muscles  and joints and improve the movement and flexibility of your elbow. Exercise A: Wrist flexors  1. Straighten your left / right elbow in front of you with your palm up. If told by your health care provider, do this stretch with your elbow bent rather than straight. 2. With your other hand, gently pull your left / right hand and fingers toward you until you feel a gentle stretch on the top  of your forearm. 3. Hold this position for __________ seconds. Repeat __________ times. Complete this exercise __________ times a day. Strengthening exercises These exercises build strength and endurance in your elbow. Endurance is the ability to use your muscles for a long time, even after several repetitions. Exercise B: Wrist flexion  1. Sit with your left / right forearm palm-up and supported on a table or other surface. 2. Let your left / right wrist extend over the edge of the surface. 3. Hold a __________ weight or a piece of rubber exercise band or tubing. Slowly curl your hand up toward your forearm. Try to only move your hand and keep the rest of your arm still. 4. Hold this position for __________ seconds. 5. Slowly return to the starting position. Repeat __________ times. Complete this exercise __________ times a day. Exercise C: Eccentric wrist flexion 1. Sit with your left / right forearm palm-up and supported on a table or other surface. 2. Let your left / right wrist extend over the edge of the surface. 3. Hold a __________ weight or a piece of rubber exercise band or tubing. 4. Use your other hand to move your left / right hand up toward your forearm. 5. Slowly return to the starting position using only your left / right hand. Repeat __________ times. Complete this exercise __________ times a day. Exercise D: Hand turns, pronation i 1. Sit with your left / right forearm supported on a table or other surface. 2. Move your wrist so your pinkie travels toward your forearm and your thumb moves away from your forearm. 3. Hold this position for __________ seconds. 4. Slowly return to the starting position. Exercise E: Hand turns, pronation ii  1. Sit with your left / right forearm supported on a table or other surface. 2. Hold a hammer in your left / right hand. ? The exercise will be easier if you hold on near the head of the hammer. ? If you hold on toward the end of the  handle, the exercise will be harder. 3. Rest your left / right hand over the edge of the table with your palm facing up. 4. Without moving your elbow, slowly turn your palm and your hand down toward the table. 5. Hold this position for __________ seconds. 6. Slowly return to the starting position. Repeat __________ times. Complete this exercise __________ times a day. Exercise F: Shoulder blade squeeze 1. Sit in a stable chair with good posture. Do not let your back touch the back of the chair. 2. Your arms should be at your sides with your elbows bent. You can rest your forearms on a pillow. 3. Squeeze your shoulder blades together. Keep your shoulders level. Do not lift your shoulders up toward your ears. 4. Hold this position for __________ seconds. 5. Slowly release. Repeat __________ times. Complete this exercise __________ times a day. This information is not intended to replace advice given to you by your health care provider. Make sure you discuss any questions you have with your health care provider. Document Released: 01/14/2005  Document Revised: 09/21/2015 Document Reviewed: 09/26/2014 Elsevier Interactive Patient Education  2018 [place] [facility] Golfer's elbow, also called medial epicondylitis, is a condition that results from inflammation of the strong bands of tissue (tendons) that attach your forearm muscles to the inside of your bone at the elbow. These tendons affect the muscles that bend the palm toward the wrist (flexion). This condition is called golfer's elbow because it is more common among people who constantly bend and twist their wrists, such as golfers. This injury usually results from overuse. Tendons also become less flexible with age. This condition causes elbow pain that may spread to your forearm and upper arm. The pain may get worse when you bend your wrist downward. What are the causes? This condition is an overuse injury that is caused  by:  Repeatedly flexing, turning, or twisting your wrist.  Constantly gripping objects with your hands.  What increases the risk? This condition is more likely to develop in people who play golf or tennis or have jobs that require the constant use of their hands. This injury is more common among:  Carpenters.  Gardeners.  Musicians.  Bricklayers.  Typists.  What are the signs or symptoms? Symptoms of this condition include:  Pain near the inner elbow or forearm.  Reduced grip strength.  How is this diagnosed? This condition is diagnosed based on your symptoms, medical history, and physical exam. During the exam, your health care provider may test your grip strength and move your wrist to check for pain. You may also have an MRI to confirm the diagnosis, look for other issues, and check for tears in the ligaments, muscles, or tendons. How is this treated? Treatment for this condition includes:  Stopping all activities that make you bend or twist your wrist until your pain and other symptoms go away.  Icing your wrist to relieve pain.  Taking NSAIDs or getting corticosteroid injections to reduce pain and swelling.  Doing stretches, range-of-motion, and strengthening exercises (physical therapy) as told by your health care provider.  In rare cases, surgery may be needed if your condition does not improve. Follow these instructions at home:  If directed, apply ice to the injured area. ? Put ice in a plastic bag. ? Place a towel between your skin and the bag. ? Leave the ice on for 20 minutes, 2-3 times a day.  Move your fingers often to avoid stiffness.  Raise (elevate) the injured area above the level of your heart while you are sitting or lying down.  Return to your normal activities as told by your health care provider. Ask your health care provider what activities are safe for you.  Do exercises as told by your health care provider.  Do not use tobacco products,  including cigarettes, chewing tobacco, or e-cigarettes. If you need help quitting, ask your health care provider.  Take over-the-counter and prescription medicines only as told by your health care provider.  Keep all follow-up visits as told by your health care provider. This is important. How is this prevented?  Warm up and stretch before being active.  Cool down and stretch after being active.  Give your body time to rest between periods of activity.  Make sure to use equipment that fits you.  Be safe and responsible while being active to avoid falls.  Do at least 150 minutes of moderate-intensity exercise each week, such as brisk walking or water aerobics.  Maintain physical fitness, including: ? Strength. ? Flexibility. ? Cardiovascular  fitness. ? Endurance.  Perform exercises to strengthen the forearm muscles.  Slow your golf swing to reduce shock in the arm when making contact with the ball, if you play golf. Contact a health care provider if:  Your pain does not improve or it gets worse.  You notice numbness in your hand. Get help right away if:  Your pain is severe.  You cannot move your wrist. This information is not intended to replace advice given to you by your health care provider. Make sure you discuss any questions you have with your health care provider. Document Released: 01/14/2005 Document Revised: 09/19/2015 Document Reviewed: 09/26/2014 Elsevier Interactive Patient Education  [employer].

## 2017-05-05 DIAGNOSIS — M79644 Pain in right finger(s): Secondary | ICD-10-CM | POA: Diagnosis not present

## 2017-05-05 DIAGNOSIS — M19049 Primary osteoarthritis, unspecified hand: Secondary | ICD-10-CM | POA: Insufficient documentation

## 2017-05-05 DIAGNOSIS — G8929 Other chronic pain: Secondary | ICD-10-CM | POA: Diagnosis not present

## 2017-05-05 DIAGNOSIS — M7702 Medial epicondylitis, left elbow: Secondary | ICD-10-CM | POA: Diagnosis not present

## 2017-05-05 DIAGNOSIS — M1811 Unilateral primary osteoarthritis of first carpometacarpal joint, right hand: Secondary | ICD-10-CM | POA: Diagnosis not present

## 2017-05-05 DIAGNOSIS — M1812 Unilateral primary osteoarthritis of first carpometacarpal joint, left hand: Secondary | ICD-10-CM | POA: Diagnosis not present

## 2017-05-06 DIAGNOSIS — R972 Elevated prostate specific antigen [PSA]: Secondary | ICD-10-CM | POA: Diagnosis not present

## 2017-05-06 DIAGNOSIS — N400 Enlarged prostate without lower urinary tract symptoms: Secondary | ICD-10-CM | POA: Diagnosis not present

## 2017-05-08 ENCOUNTER — Other Ambulatory Visit: Payer: Self-pay

## 2017-05-19 DIAGNOSIS — C44519 Basal cell carcinoma of skin of other part of trunk: Secondary | ICD-10-CM | POA: Diagnosis not present

## 2017-05-24 ENCOUNTER — Other Ambulatory Visit: Payer: Self-pay | Admitting: Family Medicine

## 2017-05-24 DIAGNOSIS — M25559 Pain in unspecified hip: Secondary | ICD-10-CM

## 2017-05-24 DIAGNOSIS — M5030 Other cervical disc degeneration, unspecified cervical region: Secondary | ICD-10-CM

## 2017-05-24 DIAGNOSIS — I10 Essential (primary) hypertension: Secondary | ICD-10-CM

## 2017-05-24 DIAGNOSIS — G43909 Migraine, unspecified, not intractable, without status migrainosus: Secondary | ICD-10-CM

## 2017-05-28 ENCOUNTER — Ambulatory Visit (INDEPENDENT_AMBULATORY_CARE_PROVIDER_SITE_OTHER): Payer: Medicare Other

## 2017-05-28 VITALS — BP 102/60 | HR 70 | Temp 98.2°F | Ht 70.0 in | Wt 171.6 lb

## 2017-05-28 DIAGNOSIS — Z0000 Encounter for general adult medical examination without abnormal findings: Secondary | ICD-10-CM | POA: Diagnosis not present

## 2017-05-28 DIAGNOSIS — Z1211 Encounter for screening for malignant neoplasm of colon: Secondary | ICD-10-CM

## 2017-05-28 DIAGNOSIS — Z23 Encounter for immunization: Secondary | ICD-10-CM | POA: Diagnosis not present

## 2017-05-28 NOTE — Patient Instructions (Signed)
Marcus Gonzalez , Thank you for taking time to come for your Medicare Wellness Visit. I appreciate your ongoing commitment to your health goals. Please review the following plan we discussed and let me know if I can assist you in the future.   Screening recommendations/referrals: Colorectal Screening: You will receive a call from our office regarding you appointment  Vision and Dental Exams: Recommended annual ophthalmology exams for early detection of glaucoma and other disorders of the eye Recommended annual dental exams for proper oral hygiene  Vaccinations: Influenza vaccine: Up to date Pneumococcal vaccine: Completed 1 of 2 today Tdap vaccine: Up to date Shingles vaccine: Please call your insurance company to determine your out of pocket expense for the Shingrix vaccine. You may also receive this vaccine at your local pharmacy or Health Dept.   Advanced directives: Please bring a copy of your POA (Power of Attorney) and/or Living Will to your next appointment. You are welcome to email these documents to [email or web address]  Conditions/risks identified: Recommend to drink at least 6-8 8oz glasses of water per day.  Next appointment: Please schedule your Annual Wellness Visit with your Nurse Health Advisor in one year.  Preventive Care 40-64 Years, Male Preventive care refers to lifestyle choices and visits with your health care provider that can promote health and wellness. What does preventive care include?  A yearly physical exam. This is also called an annual well check.  Dental exams once or twice a year.  Routine eye exams. Ask your health care provider how often you should have your eyes checked.  Personal lifestyle choices, including:  Daily care of your teeth and gums.  Regular physical activity.  Eating a healthy diet.  Avoiding tobacco and drug use.  Limiting alcohol use.  Practicing safe sex.  Taking low-dose aspirin every day starting at age 26. What  happens during an annual well check? The services and screenings done by your health care provider during your annual well check will depend on your age, overall health, lifestyle risk factors, and family history of disease. Counseling  Your health care provider may ask you questions about your:  Alcohol use.  Tobacco use.  Drug use.  Emotional well-being.  Home and relationship well-being.  Sexual activity.  Eating habits.  Work and work Statistician. Screening  You may have the following tests or measurements:  Height, weight, and BMI.  Blood pressure.  Lipid and cholesterol levels. These may be checked every 5 years, or more frequently if you are over 35 years old.  Skin check.  Lung cancer screening. You may have this screening every year starting at age 44 if you have a 30-pack-year history of smoking and currently smoke or have quit within the past 15 years.  Fecal occult blood test (FOBT) of the stool. You may have this test every year starting at age 30.  Flexible sigmoidoscopy or colonoscopy. You may have a sigmoidoscopy every 5 years or a colonoscopy every 10 years starting at age 43.  Prostate cancer screening. Recommendations will vary depending on your family history and other risks.  Hepatitis C blood test.  Hepatitis B blood test.  Sexually transmitted disease (STD) testing.  Diabetes screening. This is done by checking your blood sugar (glucose) after you have not eaten for a while (fasting). You may have this done every 1-3 years. Discuss your test results, treatment options, and if necessary, the need for more tests with your health care provider. Vaccines  Your health care provider may recommend certain  vaccines, such as:  Influenza vaccine. This is recommended every year.  Tetanus, diphtheria, and acellular pertussis (Tdap, Td) vaccine. You may need a Td booster every 10 years.  Zoster vaccine. You may need this after age 86.  Pneumococcal  13-valent conjugate (PCV13) vaccine. You may need this if you have certain conditions and have not been vaccinated.  Pneumococcal polysaccharide (PPSV23) vaccine. You may need one or two doses if you smoke cigarettes or if you have certain conditions. Talk to your health care provider about which screenings and vaccines you need and how often you need them. This information is not intended to replace advice given to you by your health care provider. Make sure you discuss any questions you have with your health care provider. Document Released: 02/10/2015 Document Revised: 10/04/2015 Document Reviewed: 11/15/2014 Elsevier Interactive Patient Education  2017 [place] Prevention in the Home Falls can cause injuries. They can happen to people of all ages. There are many things you can do to make your home safe and to help prevent falls. What can I do on the outside of my home?  Regularly fix the edges of walkways and driveways and fix any cracks.  Remove anything that might make you trip as you walk through a door, such as a raised step or threshold.  Trim any bushes or trees on the path to your home.  Use bright outdoor lighting.  Clear any walking paths of anything that might make someone trip, such as rocks or tools.  Regularly check to see if handrails are loose or broken. Make sure that both sides of any steps have handrails.  Any raised decks and porches should have guardrails on the edges.  Have any leaves, snow, or ice cleared regularly.  Use sand or salt on walking paths during winter.  Clean up any spills in your garage right away. This includes oil or grease spills. What can I do in the bathroom?  Use night lights.  Install grab bars by the toilet and in the tub and shower. Do not use towel bars as grab bars.  Use non-skid mats or decals in the tub or shower.  If you need to sit down in the shower, use a plastic, non-slip stool.  Keep the floor dry. Clean  up any water that spills on the floor as soon as it happens.  Remove soap buildup in the tub or shower regularly.  Attach bath mats securely with double-sided non-slip rug tape.  Do not have throw rugs and other things on the floor that can make you trip. What can I do in the bedroom?  Use night lights.  Make sure that you have a light by your bed that is easy to reach.  Do not use any sheets or blankets that are too big for your bed. They should not hang down onto the floor.  Have a firm chair that has side arms. You can use this for support while you get dressed.  Do not have throw rugs and other things on the floor that can make you trip. What can I do in the kitchen?  Clean up any spills right away.  Avoid walking on wet floors.  Keep items that you use a lot in easy-to-reach places.  If you need to reach something above you, use a strong step stool that has a grab bar.  Keep electrical cords out of the way.  Do not use floor polish or wax that makes floors slippery. If  you must use wax, use non-skid floor wax.  Do not have throw rugs and other things on the floor that can make you trip. What can I do with my stairs?  Do not leave any items on the stairs.  Make sure that there are handrails on both sides of the stairs and use them. Fix handrails that are broken or loose. Make sure that handrails are as long as the stairways.  Check any carpeting to make sure that it is firmly attached to the stairs. Fix any carpet that is loose or worn.  Avoid having throw rugs at the top or bottom of the stairs. If you do have throw rugs, attach them to the floor with carpet tape.  Make sure that you have a light switch at the top of the stairs and the bottom of the stairs. If you do not have them, ask someone to add them for you. What else can I do to help prevent falls?  Wear shoes that:  Do not have high heels.  Have rubber bottoms.  Are comfortable and fit you well.  Are  closed at the toe. Do not wear sandals.  If you use a stepladder:  Make sure that it is fully opened. Do not climb a closed stepladder.  Make sure that both sides of the stepladder are locked into place.  Ask someone to hold it for you, if possible.  Clearly mark and make sure that you can see:  Any grab bars or handrails.  First and last steps.  Where the edge of each step is.  Use tools that help you move around (mobility aids) if they are needed. These include:  Canes.  Walkers.  Scooters.  Crutches.  Turn on the lights when you go into a dark area. Replace any light bulbs as soon as they burn out.  Set up your furniture so you have a clear path. Avoid moving your furniture around.  If any of your floors are uneven, fix them.  If there are any pets around you, be aware of where they are.  Review your medicines with your doctor. Some medicines can make you feel dizzy. This can increase your chance of falling. Ask your doctor what other things that you can do to help prevent falls. This information is not intended to replace advice given to you by your health care provider. Make sure you discuss any questions you have with your health care provider. Document Released: 11/10/2008 Document Revised: 06/22/2015 Document Reviewed: 02/18/2014 Elsevier Interactive Patient Education  2017 [employer].

## 2017-05-28 NOTE — Progress Notes (Signed)
Subjective:   Marcus Gonzalez is a 67 y.o. male who presents for an Initial Medicare Annual Wellness Visit.  Review of Systems  N/A Cardiac Risk Factors include: advanced age (>30men, >8 women);dyslipidemia;hypertension;male gender    Objective:    Today's Vitals   05/28/17 0819  BP: 102/60  Pulse: 70  Temp: 98.2 F (36.8 C)  TempSrc: Oral  SpO2: 92%  Weight: 171 lb 9.6 oz (77.8 kg)  Height: 5\' 10"  (1.778 m)   Body mass index is 24.62 kg/m.  Advanced Directives 05/28/2017 10/07/2014  Does Patient Have a Medical Advance Directive? Yes Yes  Type of Paramedic of [place];Living will [place];Living will  Copy of [place] in Chart? No - copy requested No - copy requested    Current Medications (verified) Outpatient Encounter Medications as of 05/28/2017  Medication Sig  . amLODipine (NORVASC) 2.5 MG tablet Take 1 tablet (2.5 mg total) by mouth daily.  . cyclobenzaprine (FLEXERIL) 10 MG tablet TAKE 1 TABLET BY MOUTH THREE TIMES DAILY AS NEEDED FOR MUSCLE SPASMS  . dutasteride (AVODART) 0.5 MG capsule Take 1 capsule (0.5 mg total) by mouth every 3 (three) days. Dr Jacqlyn Larsen  . etodolac (LODINE) 500 MG tablet TAKE 1 TABLET BY MOUTH TWICE DAILY (Patient taking differently: Take one tablet daily in the morning. Take another tablet in the evening for break through pain as needed)  . lisinopril (PRINIVIL,ZESTRIL) 20 MG tablet TAKE 1 TABLET BY MOUTH DAILY  . propranolol ER (INDERAL LA) 80 MG 24 hr capsule TAKE ONE CAPSULE BY MOUTH DAILY   No facility-administered encounter medications on file as of 05/28/2017.     Allergies (verified) Cephalexin   History: Past Medical History:  Diagnosis Date  . BPH (benign prostatic hyperplasia)   . Degenerative disc disease, cervical   . Degenerative disc disease, cervical   . Headache   . Hip pain   . Migraine   . Migraine    Past Surgical History:  Procedure Laterality Date    . BASAL CELL CARCINOMA EXCISION     arm and leg  . COLONOSCOPY  2013   cleared for 5 yrs- Dr Allen Norris  . skin ca removed     Family History  Problem Relation Age of Onset  . Heart disease Father   . Diabetes Maternal Aunt   . Cancer Maternal Grandmother        colon cancer  . Heart disease Mother   . Dementia Mother   . Other Brother        "bladder issues"   Social History   Socioeconomic History  . Marital status: Single    Spouse name: Not on file  . Number of children: 0  . Years of education: Not on file  . Highest education level: Doctorate  Occupational History    Employer: [employer]   Social Needs  . Financial resource strain: Not hard at all  . Food insecurity:    Worry: Never true    Inability: Never true  . Transportation needs:    Medical: No    Non-medical: No  Tobacco Use  . Smoking status: Former Smoker    Packs/day: 1.00    Years: 29.00    Pack years: 29.00    Types: Cigarettes    Last attempt to quit: 1999    Years since quitting: 20.3  . Smokeless tobacco: Never Used  . Tobacco comment: smoking cessation materials not required  Substance and Sexual  Activity  . Alcohol use: Yes    Alcohol/week: 8.4 oz    Types: 14 Glasses of wine per week  . Drug use: No  . Sexual activity: Not Currently  Lifestyle  . Physical activity:    Days per week: 4 days    Minutes per session: 60 min  . Stress: Not at all  Relationships  . Social connections:    Talks on phone: Patient refused    Gets together: Patient refused    Attends religious service: Patient refused    Active member of club or organization: Patient refused    Attends meetings of clubs or organizations: Patient refused    Relationship status: Patient refused  Other Topics Concern  . Not on file  Social History Narrative  . Not on file   Tobacco Counseling Counseling given: No Comment: smoking cessation materials not required   Clinical Intake:  Pre-visit preparation completed:  Yes  Pain : No/denies pain   BMI - recorded: 24.62 Nutritional Status: BMI of 19-24  Normal Nutritional Risks: None Diabetes: No  How often do you need to have someone help you when you read instructions, pamphlets, or other written materials from your doctor or pharmacy?: 1 - Never  Interpreter Needed?: No  Information entered by :: AEversole, LPN   Activities of Daily Living In your present state of health, do you have any difficulty performing the following activities: 05/28/2017  Hearing? N  Comment denies hearing aids  Vision? N  Comment wears reading eyeglasses  Difficulty concentrating or making decisions? N  Walking or climbing stairs? N  Dressing or bathing? N  Doing errands, shopping? N  Preparing Food and eating ? N  Comment denies dentures  Using the Toilet? N  In the past six months, have you accidently leaked urine? N  Do you have problems with loss of bowel control? N  Managing your Medications? N  Managing your Finances? N  Housekeeping or managing your Housekeeping? N  Some recent data might be hidden     Immunizations and Health Maintenance Immunization History  Administered Date(s) Administered  . Influenza, High Dose Seasonal PF 11/29/2016  . Influenza, Seasonal, Injecte, Preservative Fre 11/06/2010  . Influenza,inj,Quad PF,6+ Mos 11/23/2012, 10/29/2013, 11/22/2014, 11/13/2015  . Influenza-Unspecified 10/29/2013, 10/28/2016  . Pneumococcal Conjugate-13 05/28/2017  . Tdap 04/03/2010   There are no preventive care reminders to display for this patient.  Patient Care Team: Juline Patch, MD as PCP - General (Family Medicine) Murrell Redden, MD as Consulting Physician (Urology) Dasher, Rayvon Char, MD as Consulting Physician (Dermatology) Tamsen Meek, MD as Consulting Physician (Dermatology)  Indicate any recent Medical Services you may have received from other than [facility] providers in the past year (date may be approximate).    Assessment:    This is a routine wellness examination for [place].  Hearing/Vision screen Vision Screening Comments: Sees Dr. Mallie Mussel for annual eye exams  Dietary issues and exercise activities discussed: Current Exercise Habits: Structured exercise class, Type of exercise: strength training/weights;treadmill, Time (Minutes): 60, Frequency (Times/Week): 4, Weekly Exercise (Minutes/Week): 240, Intensity: Mild, Exercise limited by: None identified  Goals    . DIET - INCREASE WATER INTAKE     Recommend to drink at least 6-8 8oz glasses of water per day.      Depression Screen PHQ 2/9 Scores 05/28/2017 11/29/2016 06/09/2015 04/07/2015  PHQ - 2 Score 0 0 0 0  PHQ- 9 Score 0 0 - -    Fall Risk Fall  Risk  05/28/2017 11/29/2016 06/09/2015 04/07/2015 10/07/2014  Falls in the past year? No No No No No    Is the home free of loose throw rugs in walkways, pet beds, electrical cords, etc? Yes Adequate lighting to reduce risk of falls?  Yes In addition, does the patient have any of the following: Stairs in or around the home WITH handrails? Yes Grab bars in the bathroom? No  Shower chair or a place to sit while bathing? No Use of an elevated toilet seat or a handicapped toilet? No Use of a cane, walker or w/c? No  Timed Get Up and Go Performed: Yes. Pt ambulated 10 feet within 5 sec. Gait stead-fast and without the use of an assistive device. No intervention required at this time. Fall risk prevention has been discussed.  Pt declined my offer to send Community Resource Referral to Care Guide for installation of grab bars in the shower, shower chair or an elevated toilet seat.  Cognitive Function:     6CIT Screen 05/28/2017  What Year? 0 points  What month? 0 points  What time? 0 points  Count back from 20 0 points  Months in reverse 0 points  Repeat phrase 0 points  Total Score 0    Screening Tests Health Maintenance  Topic Date Due  . INFLUENZA VACCINE  08/28/2017  . COLONOSCOPY  03/28/2020  .  TETANUS/TDAP  04/02/2020  . PNA vac Low Risk Adult  Completed  . Hepatitis C Screening  Discontinued    Qualifies for Shingles Vaccine? Yes. Due for Zostavax or Shingrix vaccine. Education has been provided regarding the importance of this vaccine. Pt has been advised to call his insurance company to determine his out of pocket expense. Advised he may also receive this vaccine at his local pharmacy or Health Dept. Verbalized acceptance and understanding.  Cancer Screenings: Lung: Low Dose CT Chest recommended if Age 24-80 years, 30 pack-year currently smoking OR have quit w/in 15years. Patient does not qualify. Colorectal: Unable to locate a previous colonoscopy report. Pt states he had one completed but it has been "many" years ago. GI referral placed today for repeat screening colonoscopy. Pt is aware that he will receive a call from our office re: his appt.   Additional Screenings: Hepatitis C Screening: Declined    Plan:  I have personally reviewed and addressed the Medicare Annual Wellness questionnaire and have noted the following in the patient's chart:  A. Medical and social history B. Use of alcohol, tobacco or illicit drugs  C. Current medications and supplements D. Functional ability and status E.  Nutritional status F.  Physical activity G. Advance directives H. List of other physicians I.  Hospitalizations, surgeries, and ER visits in previous 12 months J.  [place] such as hearing and vision if needed, cognitive and depression L. Referrals and appointments  In addition, I have reviewed and discussed with patient certain preventive protocols, quality metrics, and best practice recommendations. A written personalized care plan for preventive services as well as general preventive health recommendations were provided to patient.  Signed,  Aleatha Borer, LPN Nurse Health Advisor  MD Recommendations: Due for Zostavax or Shingrix vaccine. Education has been  provided regarding the importance of this vaccine. Pt has been advised to call his insurance company to determine his out of pocket expense. Advised he may also receive this vaccine at his local pharmacy or Health Dept. Verbalized acceptance and understanding.  Colorectal Screening: Unable to locate a previous colonoscopy report.  Pt states he had one completed but it has been "many" years ago. GI referral placed today for repeat screening colonoscopy. Pt is aware that he will receive a call from our office re: his appt.

## 2017-06-02 ENCOUNTER — Telehealth: Payer: Self-pay

## 2017-06-02 DIAGNOSIS — R972 Elevated prostate specific antigen [PSA]: Secondary | ICD-10-CM | POA: Diagnosis not present

## 2017-06-02 DIAGNOSIS — C61 Malignant neoplasm of prostate: Secondary | ICD-10-CM | POA: Diagnosis not present

## 2017-06-02 NOTE — Telephone Encounter (Signed)
Gastroenterology Pre-Procedure Review  Request Date: 07/11/17  Requesting Physician: Dr. Allen Norris   PATIENT REVIEW QUESTIONS: The patient responded to the following health history questions as indicated:    1. Are you having any GI issues? No  2. Do you have a personal history of Polyps? Yes  3. Do you have a family history of Colon Cancer or Polyps? No  4. Diabetes Mellitus? No  5. Joint replacements in the past 12 months? No  6. Major health problems in the past 3 months? No  7. Any artificial heart valves, MVP, or defibrillator? No     MEDICATIONS & ALLERGIES:    Patient reports the following regarding taking any anticoagulation/antiplatelet therapy:   Plavix, Coumadin, Eliquis, Xarelto, Lovenox, Pradaxa, Brilinta, or Effient? No  Aspirin? No   Patient confirms/reports the following medications:  Current Outpatient Medications  Medication Sig Dispense Refill  . amLODipine (NORVASC) 2.5 MG tablet Take 1 tablet (2.5 mg total) by mouth daily. 90 tablet 3  . cyclobenzaprine (FLEXERIL) 10 MG tablet TAKE 1 TABLET BY MOUTH THREE TIMES DAILY AS NEEDED FOR MUSCLE SPASMS 90 tablet 0  . dutasteride (AVODART) 0.5 MG capsule Take 1 capsule (0.5 mg total) by mouth every 3 (three) days. Dr Jacqlyn Larsen 30 capsule 6  . etodolac (LODINE) 500 MG tablet TAKE 1 TABLET BY MOUTH TWICE DAILY (Patient taking differently: Take one tablet daily in the morning. Take another tablet in the evening for break through pain as needed) 180 tablet 0  . lisinopril (PRINIVIL,ZESTRIL) 20 MG tablet TAKE 1 TABLET BY MOUTH DAILY 90 tablet 0  . propranolol ER (INDERAL LA) 80 MG 24 hr capsule TAKE ONE CAPSULE BY MOUTH DAILY 90 capsule 0   No current facility-administered medications for this visit.     Patient confirms/reports the following allergies:  Allergies  Allergen Reactions  . Cephalexin     No orders of the defined types were placed in this encounter.   AUTHORIZATION INFORMATION Primary Insurance: 1D#: Group  #:  Secondary Insurance: 1D#: Group #:  SCHEDULE INFORMATION: Date: 07/11/17 Time: Location: [place]

## 2017-06-04 ENCOUNTER — Other Ambulatory Visit: Payer: Self-pay

## 2017-06-04 DIAGNOSIS — Z1211 Encounter for screening for malignant neoplasm of colon: Secondary | ICD-10-CM

## 2017-06-26 DIAGNOSIS — C61 Malignant neoplasm of prostate: Secondary | ICD-10-CM | POA: Diagnosis not present

## 2017-07-03 DIAGNOSIS — C61 Malignant neoplasm of prostate: Secondary | ICD-10-CM | POA: Diagnosis not present

## 2017-07-03 DIAGNOSIS — Z6824 Body mass index (BMI) 24.0-24.9, adult: Secondary | ICD-10-CM | POA: Diagnosis not present

## 2017-07-07 DIAGNOSIS — Z510 Encounter for antineoplastic radiation therapy: Secondary | ICD-10-CM | POA: Diagnosis not present

## 2017-07-07 DIAGNOSIS — Z6824 Body mass index (BMI) 24.0-24.9, adult: Secondary | ICD-10-CM | POA: Diagnosis not present

## 2017-07-07 DIAGNOSIS — C61 Malignant neoplasm of prostate: Secondary | ICD-10-CM | POA: Diagnosis not present

## 2017-08-13 ENCOUNTER — Other Ambulatory Visit: Payer: Self-pay

## 2017-08-13 ENCOUNTER — Encounter: Payer: Self-pay | Admitting: *Deleted

## 2017-08-18 ENCOUNTER — Telehealth: Payer: Self-pay | Admitting: Gastroenterology

## 2017-08-18 NOTE — Telephone Encounter (Signed)
Patient has been advised to follow the instructions received in the mail for his colonoscopy.  He has been advised nothing to eat or drink 4 hours prior to colonoscopy, clear liquids day before, 2 and 3 days  Before low fiber.  Begin bowel prep between 5-6 mix with 16 oz of cold water and repeat again 5 hours before his procedure or at bedtime with second bottle.  Thanks Eliyah Mcshea

## 2017-08-18 NOTE — Telephone Encounter (Signed)
Patient LVM to please call him. He has some prepping questions.

## 2017-08-19 ENCOUNTER — Other Ambulatory Visit: Payer: Self-pay | Admitting: Family Medicine

## 2017-08-19 DIAGNOSIS — C61 Malignant neoplasm of prostate: Secondary | ICD-10-CM | POA: Diagnosis not present

## 2017-08-19 DIAGNOSIS — M25559 Pain in unspecified hip: Secondary | ICD-10-CM

## 2017-08-19 DIAGNOSIS — I10 Essential (primary) hypertension: Secondary | ICD-10-CM

## 2017-08-19 DIAGNOSIS — G43909 Migraine, unspecified, not intractable, without status migrainosus: Secondary | ICD-10-CM

## 2017-08-19 DIAGNOSIS — M5030 Other cervical disc degeneration, unspecified cervical region: Secondary | ICD-10-CM

## 2017-08-21 NOTE — Discharge Instructions (Signed)
General Anesthesia, Adult, Care After °These instructions provide you with information about caring for yourself after your procedure. Your health care provider may also give you more specific instructions. Your treatment has been planned according to current medical practices, but problems sometimes occur. Call your health care provider if you have any problems or questions after your procedure. °What can I expect after the procedure? °After the procedure, it is common to have: °· Vomiting. °· A sore throat. °· Mental slowness. ° °It is common to feel: °· Nauseous. °· Cold or shivery. °· Sleepy. °· Tired. °· Sore or achy, even in parts of your body where you did not have surgery. ° °Follow these instructions at home: °For at least 24 hours after the procedure: °· Do not: °? Participate in activities where you could fall or become injured. °? Drive. °? Use heavy machinery. °? Drink alcohol. °? Take sleeping pills or medicines that cause drowsiness. °? Make important decisions or sign legal documents. °? Take care of children on your own. °· Rest. °Eating and drinking °· If you vomit, drink water, juice, or soup when you can drink without vomiting. °· Drink enough fluid to keep your urine clear or pale yellow. °· Make sure you have little or no nausea before eating solid foods. °· Follow the diet recommended by your health care provider. °General instructions °· Have a responsible adult stay with you until you are awake and alert. °· Return to your normal activities as told by your health care provider. Ask your health care provider what activities are safe for you. °· Take over-the-counter and prescription medicines only as told by your health care provider. °· If you smoke, do not smoke without supervision. °· Keep all follow-up visits as told by your health care provider. This is important. °Contact a health care provider if: °· You continue to have nausea or vomiting at home, and medicines are not helpful. °· You  cannot drink fluids or start eating again. °· You cannot urinate after 8-12 hours. °· You develop a skin rash. °· You have fever. °· You have increasing redness at the site of your procedure. °Get help right away if: °· You have difficulty breathing. °· You have chest pain. °· You have unexpected bleeding. °· You feel that you are having a life-threatening or urgent problem. °This information is not intended to replace advice given to you by your health care provider. Make sure you discuss any questions you have with your health care provider. °Document Released: 04/22/2000 Document Revised: 06/19/2015 Document Reviewed: 12/29/2014 °Elsevier Interactive Patient Education © 2018 [employer] ° °

## 2017-08-22 ENCOUNTER — Ambulatory Visit
Admission: RE | Admit: 2017-08-22 | Discharge: 2017-08-22 | Disposition: A | Discharge status: Home / Self Care | Payer: Medicare Other | Source: Ambulatory Visit | Attending: Gastroenterology | Admitting: Gastroenterology

## 2017-08-22 ENCOUNTER — Ambulatory Visit: Payer: Medicare Other | Admitting: Anesthesiology

## 2017-08-22 ENCOUNTER — Encounter
Admission: RE | Disposition: A | Discharge status: Home / Self Care | Payer: Self-pay | Source: Ambulatory Visit | Attending: Gastroenterology

## 2017-08-22 DIAGNOSIS — M1990 Unspecified osteoarthritis, unspecified site: Secondary | ICD-10-CM | POA: Diagnosis not present

## 2017-08-22 DIAGNOSIS — Z1211 Encounter for screening for malignant neoplasm of colon: Secondary | ICD-10-CM | POA: Diagnosis not present

## 2017-08-22 DIAGNOSIS — D122 Benign neoplasm of ascending colon: Secondary | ICD-10-CM | POA: Insufficient documentation

## 2017-08-22 DIAGNOSIS — Z888 Allergy status to other drugs, medicaments and biological substances status: Secondary | ICD-10-CM | POA: Diagnosis not present

## 2017-08-22 DIAGNOSIS — K635 Polyp of colon: Secondary | ICD-10-CM | POA: Diagnosis not present

## 2017-08-22 DIAGNOSIS — Z8546 Personal history of malignant neoplasm of prostate: Secondary | ICD-10-CM | POA: Diagnosis not present

## 2017-08-22 DIAGNOSIS — K621 Rectal polyp: Secondary | ICD-10-CM | POA: Insufficient documentation

## 2017-08-22 DIAGNOSIS — Z87891 Personal history of nicotine dependence: Secondary | ICD-10-CM | POA: Diagnosis not present

## 2017-08-22 DIAGNOSIS — Z79899 Other long term (current) drug therapy: Secondary | ICD-10-CM | POA: Insufficient documentation

## 2017-08-22 DIAGNOSIS — I10 Essential (primary) hypertension: Secondary | ICD-10-CM | POA: Diagnosis not present

## 2017-08-22 DIAGNOSIS — Z85828 Personal history of other malignant neoplasm of skin: Secondary | ICD-10-CM | POA: Diagnosis not present

## 2017-08-22 DIAGNOSIS — K5730 Diverticulosis of large intestine without perforation or abscess without bleeding: Secondary | ICD-10-CM | POA: Insufficient documentation

## 2017-08-22 DIAGNOSIS — D125 Benign neoplasm of sigmoid colon: Secondary | ICD-10-CM

## 2017-08-22 DIAGNOSIS — K640 First degree hemorrhoids: Secondary | ICD-10-CM | POA: Insufficient documentation

## 2017-08-22 DIAGNOSIS — N400 Enlarged prostate without lower urinary tract symptoms: Secondary | ICD-10-CM | POA: Insufficient documentation

## 2017-08-22 DIAGNOSIS — Z8249 Family history of ischemic heart disease and other diseases of the circulatory system: Secondary | ICD-10-CM | POA: Insufficient documentation

## 2017-08-22 DIAGNOSIS — Z800 Family history of malignant neoplasm of digestive organs: Secondary | ICD-10-CM | POA: Diagnosis not present

## 2017-08-22 HISTORY — PX: COLONOSCOPY WITH PROPOFOL: SHX5780

## 2017-08-22 HISTORY — PX: POLYPECTOMY: SHX149

## 2017-08-22 HISTORY — DX: Malignant neoplasm of prostate: C61

## 2017-08-22 SURGERY — COLONOSCOPY WITH PROPOFOL
Anesthesia: General | Wound class: Contaminated

## 2017-08-22 MED ORDER — PROPOFOL 10 MG/ML IV BOLUS
INTRAVENOUS | Status: DC | PRN
  Administered 2017-08-22 (×2): 20 mg via INTRAVENOUS
  Administered 2017-08-22 (×2): 40 mg via INTRAVENOUS
  Administered 2017-08-22: 30 mg via INTRAVENOUS
  Administered 2017-08-22: 20 mg via INTRAVENOUS
  Administered 2017-08-22: 80 mg via INTRAVENOUS
  Administered 2017-08-22: 20 mg via INTRAVENOUS
  Administered 2017-08-22: 40 mg via INTRAVENOUS

## 2017-08-22 MED ORDER — OXYCODONE HCL 5 MG PO TABS: 5.0000 mg | ORAL_TABLET | Freq: Once | ORAL | Status: DC | PRN

## 2017-08-22 MED ORDER — LIDOCAINE HCL (CARDIAC) PF 100 MG/5ML IV SOSY
PREFILLED_SYRINGE | INTRAVENOUS | Status: DC | PRN
  Administered 2017-08-22: 30 mg via INTRAVENOUS

## 2017-08-22 MED ORDER — LACTATED RINGERS IV SOLN
INTRAVENOUS | Status: DC
  Administered 2017-08-22 (×2): via INTRAVENOUS

## 2017-08-22 MED ORDER — FENTANYL CITRATE (PF) 100 MCG/2ML IJ SOLN: 25.0000 ug | INTRAMUSCULAR | Status: DC | PRN

## 2017-08-22 MED ORDER — OXYCODONE HCL 5 MG/5ML PO SOLN: 5.0000 mg | Freq: Once | ORAL | Status: DC | PRN

## 2017-08-22 MED ORDER — SODIUM CHLORIDE 0.9 % IV SOLN: INTRAVENOUS | Status: DC

## 2017-08-22 SURGICAL SUPPLY — 24 items
CANISTER SUCT 1200ML W/VALVE (MISCELLANEOUS) ×4 IMPLANT
CLIP HMST 235XBRD CATH ROT (MISCELLANEOUS) IMPLANT
CLIP RESOLUTION 360 11X235 (MISCELLANEOUS)
ELECT REM PT RETURN 9FT ADLT (ELECTROSURGICAL)
ELECTRODE REM PT RTRN 9FT ADLT (ELECTROSURGICAL) IMPLANT
FCP ESCP3.2XJMB 240X2.8X (MISCELLANEOUS)
FORCEPS BIOP RAD 4 LRG CAP 4 (CUTTING FORCEPS) ×4 IMPLANT
FORCEPS BIOP RJ4 240 W/NDL (MISCELLANEOUS)
FORCEPS ESCP3.2XJMB 240X2.8X (MISCELLANEOUS) IMPLANT
GOWN CVR UNV OPN BCK APRN NK (MISCELLANEOUS) ×4 IMPLANT
GOWN ISOL THUMB LOOP REG UNIV (MISCELLANEOUS) ×4
INJECTOR VARIJECT VIN23 (MISCELLANEOUS) IMPLANT
KIT DEFENDO VALVE AND CONN (KITS) IMPLANT
KIT ENDO PROCEDURE OLY (KITS) ×4 IMPLANT
MARKER SPOT ENDO TATTOO 5ML (MISCELLANEOUS) IMPLANT
PROBE APC STR FIRE (PROBE) IMPLANT
RETRIEVER NET ROTH 2.5X230 LF (MISCELLANEOUS) IMPLANT
SNARE SHORT THROW 13M SML OVAL (MISCELLANEOUS) IMPLANT
SNARE SHORT THROW 30M LRG OVAL (MISCELLANEOUS) IMPLANT
SNARE SNG USE RND 15MM (INSTRUMENTS) IMPLANT
SPOT EX ENDOSCOPIC TATTOO (MISCELLANEOUS)
TRAP ETRAP POLY (MISCELLANEOUS) IMPLANT
VARIJECT INJECTOR VIN23 (MISCELLANEOUS)
WATER STERILE IRR 250ML POUR (IV SOLUTION) ×4 IMPLANT

## 2017-08-22 NOTE — Anesthesia Postprocedure Evaluation (Signed)
Anesthesia Post Note  Patient: Marcus Gonzalez  Procedure(s) Performed: COLONOSCOPY WITH PROPOFOL (N/A ) POLYPECTOMY INTESTINAL  Patient location during evaluation: PACU Anesthesia Type: General Level of consciousness: awake and alert Pain management: pain level controlled Vital Signs Assessment: post-procedure vital signs reviewed and stable Respiratory status: spontaneous breathing, nonlabored ventilation, respiratory function stable and patient connected to nasal cannula oxygen Cardiovascular status: blood pressure returned to baseline and stable Postop Assessment: no apparent nausea or vomiting Anesthetic complications: no    Deryl Ports C

## 2017-08-22 NOTE — Anesthesia Preprocedure Evaluation (Signed)
Anesthesia Evaluation  Patient identified by MRN, date of birth, ID band Patient awake    Reviewed: Allergy & Precautions, NPO status , Patient's Chart, lab work & pertinent test results  Airway Mallampati: II  TM Distance: >3 FB Neck ROM: Full    Dental no notable dental hx.    Pulmonary neg pulmonary ROS, former smoker,    Pulmonary exam normal breath sounds clear to auscultation       Cardiovascular hypertension, Normal cardiovascular exam Rhythm:Regular Rate:Normal  hyperlipidemia   Neuro/Psych  Headaches, Anxiety negative neurological ROS  negative psych ROS   GI/Hepatic negative GI ROS, Neg liver ROS,   Endo/Other  negative endocrine ROS  Renal/GU negative Renal ROS  negative genitourinary   Musculoskeletal  (+) Arthritis ,   Abdominal   Peds negative pediatric ROS (+)  Hematology negative hematology ROS (+)   Anesthesia Other Findings   Reproductive/Obstetrics negative OB ROS                             Anesthesia Physical Anesthesia Plan  ASA: II  Anesthesia Plan: General   Post-op Pain Management:    Induction: Intravenous  PONV Risk Score and Plan:   Airway Management Planned: Natural Airway  Additional Equipment:   Intra-op Plan:   Post-operative Plan: Extubation in OR  Informed Consent: I have reviewed the patients History and Physical, chart, labs and discussed the procedure including the risks, benefits and alternatives for the proposed anesthesia with the patient or authorized representative who has indicated his/her understanding and acceptance.   Dental advisory given  Plan Discussed with: CRNA  Anesthesia Plan Comments:         Anesthesia Quick Evaluation

## 2017-08-22 NOTE — Transfer of Care (Signed)
Immediate Anesthesia Transfer of Care Note  Patient: Marcus Gonzalez  Procedure(s) Performed: COLONOSCOPY WITH PROPOFOL (N/A ) POLYPECTOMY INTESTINAL  Patient Location: PACU  Anesthesia Type: General  Level of Consciousness: awake, alert  and patient cooperative  Airway and Oxygen Therapy: Patient Spontanous Breathing and Patient connected to supplemental oxygen  Post-op Assessment: Post-op Vital signs reviewed, Patient's Cardiovascular Status Stable, Respiratory Function Stable, Patent Airway and No signs of Nausea or vomiting  Post-op Vital Signs: Reviewed and stable  Complications: No apparent anesthesia complications

## 2017-08-22 NOTE — Op Note (Signed)
[facility] Gastroenterology Patient Name: Marcus Gonzalez Procedure Date: 08/22/2017 8:06 AM MRN: [id number] Account #: [id number] Date of Birth: 27-Feb-1950 Admit Type: Outpatient Age: 67 Room: [facility] OR ROOM 01 Gender: Male Note Status: Finalized Procedure:            Colonoscopy Indications:          Screening for colorectal malignant neoplasm Providers:            Lucilla Lame MD, MD Referring MD:         Juline Patch, MD (Referring MD) Medicines:            Propofol per Anesthesia Complications:        No immediate complications. Procedure:            Pre-Anesthesia Assessment:                       - Prior to the procedure, a History and Physical was                        performed, and patient medications and allergies were                        reviewed. The patient's tolerance of previous                        anesthesia was also reviewed. The risks and benefits of                        the procedure and the sedation options and risks were                        discussed with the patient. All questions were                        answered, and informed consent was obtained. Prior                        Anticoagulants: The patient has taken no previous                        anticoagulant or antiplatelet agents. ASA Grade                        Assessment: II - A patient with mild systemic disease.                        After reviewing the risks and benefits, the patient was                        deemed in satisfactory condition to undergo the                        procedure.                       After obtaining informed consent, the colonoscope was                        passed under direct vision. Throughout the procedure,  the patient's blood pressure, pulse, and oxygen                        saturations were monitored continuously. The was                        introduced through the anus and advanced to the the                cecum, identified by appendiceal orifice and ileocecal                        valve. The colonoscopy was performed without                        difficulty. The patient tolerated the procedure well.                        The quality of the bowel preparation was excellent. Findings:      The perianal and digital rectal examinations were normal.      A 2 mm polyp was found in the ascending colon. The polyp was sessile.       The polyp was removed with a cold biopsy forceps. Resection and       retrieval were complete.      Three sessile polyps were found in the sigmoid colon. The polyps were 2       to 4 mm in size. These polyps were removed with a cold biopsy forceps.       Resection and retrieval were complete.      A 4 mm polyp was found in the rectum. The polyp was sessile. The polyp       was removed with a cold biopsy forceps. Resection and retrieval were       complete.      A few small-mouthed diverticula were found in the entire colon.      Non-bleeding internal hemorrhoids were found during retroflexion. The       hemorrhoids were Grade I (internal hemorrhoids that do not prolapse). Impression:           - One 2 mm polyp in the ascending colon, removed with a                        cold biopsy forceps. Resected and retrieved.                       - Three 2 to 4 mm polyps in the sigmoid colon, removed                        with a cold biopsy forceps. Resected and retrieved.                       - One 4 mm polyp in the rectum, removed with a cold                        biopsy forceps. Resected and retrieved.                       - Diverticulosis in the entire examined colon.                       -  Non-bleeding internal hemorrhoids. Recommendation:       - Discharge patient to home.                       - Resume previous diet.                       - Continue present medications.                       - Await pathology results.                       - Repeat  colonoscopy in 5 years if polyp adenoma and 10                        years if hyperplastic Procedure Code(s):    --- Professional ---                       [phone], Colonoscopy, flexible; with biopsy, single or                        multiple Diagnosis Code(s):    --- Professional ---                       Z12.11, Encounter for screening for malignant neoplasm                        of colon                       D12.2, Benign neoplasm of ascending colon                       K62.1, Rectal polyp                       D12.5, Benign neoplasm of sigmoid colon CPT copyright 2017 American [employer]. All rights reserved. The codes documented in this report are preliminary and upon coder review may  be revised to meet current compliance requirements. Lucilla Lame MD, MD 08/22/2017 8:27:47 AM This report has been signed electronically. Number of Addenda: 0 Note Initiated On: 08/22/2017 8:06 AM Scope Withdrawal Time: 0 hours 9 minutes 20 seconds  Total Procedure Duration: 0 hours 13 minutes 17 seconds       [facility]

## 2017-08-22 NOTE — H&P (Signed)
Lucilla Lame, MD [address] [place], [place] [address] Phone: [phone] Fax : [phone]  Primary Care Physician:  Juline Patch, MD Primary Gastroenterologist:  Dr. Allen Norris  Pre-Procedure History & Physical: HPI:  Marcus Gonzalez is a 67 y.o. male is here for a screening colonoscopy.   Past Medical History:  Diagnosis Date  . BPH (benign prostatic hyperplasia)   . Degenerative disc disease, cervical   . Headache   . Hip pain   . Migraine   . Prostate cancer [facility])     Past Surgical History:  Procedure Laterality Date  . BASAL CELL CARCINOMA EXCISION     arm and leg  . COLONOSCOPY  2013   cleared for 5 yrs- Dr Allen Norris  . skin ca removed      Prior to Admission medications   Medication Sig Start Date End Date Taking? Authorizing Provider  amLODipine (NORVASC) 2.5 MG tablet Take 1 tablet (2.5 mg total) by mouth daily. 11/29/16  Yes Juline Patch, MD  bicalutamide (CASODEX) 50 MG tablet Take 50 mg by mouth daily.   Yes [provider]  cyclobenzaprine (FLEXERIL) 10 MG tablet TAKE 1 TABLET BY MOUTH THREE TIMES DAILY AS NEEDED FOR MUSCLE SPASMS 08/19/17  Yes Juline Patch, MD  etodolac (LODINE) 500 MG tablet Take one tablet daily in the morning. Take another tablet in the evening for break through pain as needed 08/19/17  Yes Jones, Deanna C, MD  lisinopril (PRINIVIL,ZESTRIL) 20 MG tablet TAKE 1 TABLET BY MOUTH DAILY 08/19/17  Yes Juline Patch, MD  propranolol ER (INDERAL LA) 80 MG 24 hr capsule TAKE ONE CAPSULE BY MOUTH DAILY 08/19/17  Yes Juline Patch, MD  dutasteride (AVODART) 0.5 MG capsule Take 1 capsule (0.5 mg total) by mouth every 3 (three) days. Dr Jacqlyn Larsen Patient not taking: Reported on 08/13/2017 11/29/16   Juline Patch, MD  tamsulosin (FLOMAX) 0.4 MG CAPS capsule Take 0.4 mg by mouth daily.    [provider]    Allergies as of 06/04/2017 - Review Complete 05/28/2017  Allergen Reaction Noted  . Cephalexin  05/24/2014     Family History  Problem Relation Age of Onset  . Heart disease Father   . Diabetes Maternal Aunt   . Cancer Maternal Grandmother        colon cancer  . Heart disease Mother   . Dementia Mother   . Other Brother        "bladder issues"    Social History   Socioeconomic History  . Marital status: Single    Spouse name: Not on file  . Number of children: 0  . Years of education: Not on file  . Highest education level: Doctorate  Occupational History    Employer: [employer]   Social Needs  . Financial resource strain: Not hard at all  . Food insecurity:    Worry: Never true    Inability: Never true  . Transportation needs:    Medical: No    Non-medical: No  Tobacco Use  . Smoking status: Former Smoker    Packs/day: 1.00    Years: 29.00    Pack years: 29.00    Types: Cigarettes    Last attempt to quit: 1999    Years since quitting: 20.5  . Smokeless tobacco: Never Used  . Tobacco comment: smoking cessation materials not required  Substance and Sexual Activity  . Alcohol use: Yes    Alcohol/week: 8.4 oz  Types: 14 Glasses of wine per week  . Drug use: No  . Sexual activity: Not Currently  Lifestyle  . Physical activity:    Days per week: 4 days    Minutes per session: 60 min  . Stress: Not at all  Relationships  . Social connections:    Talks on phone: Patient refused    Gets together: Patient refused    Attends religious service: Patient refused    Active member of club or organization: Patient refused    Attends meetings of clubs or organizations: Patient refused    Relationship status: Patient refused  . Intimate partner violence:    Fear of current or ex partner: No    Emotionally abused: No    Physically abused: No    Forced sexual activity: No  Other Topics Concern  . Not on file  Social History Narrative  . Not on file    Review of Systems: See HPI, otherwise negative ROS  Physical Exam: BP (!) 128/93   Pulse 64   Temp (!) 97.2 F (36.2  C) (Temporal)   Resp 16   Ht 5\' 10"  (1.778 m)   Wt 166 lb (75.3 kg)   SpO2 100%   BMI 23.82 kg/m  General:   Alert,  pleasant and cooperative in NAD Head:  Normocephalic and atraumatic. Neck:  Supple; no masses or thyromegaly. Lungs:  Clear throughout to auscultation.    Heart:  Regular rate and rhythm. Abdomen:  Soft, nontender and nondistended. Normal bowel sounds, without guarding, and without rebound.   Neurologic:  Alert and  oriented x4;  grossly normal neurologically.  Impression/Plan: Marcus Gonzalez is now here to undergo a screening colonoscopy.  Risks, benefits, and alternatives regarding colonoscopy have been reviewed with the patient.  Questions have been answered.  All parties agreeable.

## 2017-08-22 NOTE — Anesthesia Procedure Notes (Signed)
Procedure Name: MAC Date/Time: 08/22/2017 8:10 AM Performed by: Lind Guest, CRNA Pre-anesthesia Checklist: Patient identified, Emergency Drugs available, Suction available, Patient being monitored and Timeout performed Patient Re-evaluated:Patient Re-evaluated prior to induction Oxygen Delivery Method: Nasal cannula

## 2017-08-22 NOTE — Anesthesia Procedure Notes (Signed)
Performed by: Esmerelda Finnigan, CRNA       

## 2017-08-25 ENCOUNTER — Other Ambulatory Visit: Payer: Self-pay

## 2017-08-25 ENCOUNTER — Encounter: Payer: Self-pay | Admitting: Gastroenterology

## 2017-08-25 DIAGNOSIS — I10 Essential (primary) hypertension: Secondary | ICD-10-CM

## 2017-08-25 DIAGNOSIS — C61 Malignant neoplasm of prostate: Secondary | ICD-10-CM | POA: Diagnosis not present

## 2017-08-25 MED ORDER — AMLODIPINE BESYLATE 2.5 MG PO TABS: 2.5000 mg | ORAL_TABLET | Freq: Every day | ORAL | 1 refills | Status: DC

## 2017-08-26 ENCOUNTER — Encounter: Payer: Self-pay | Admitting: Gastroenterology

## 2017-08-29 DIAGNOSIS — Z510 Encounter for antineoplastic radiation therapy: Secondary | ICD-10-CM | POA: Diagnosis not present

## 2017-08-29 DIAGNOSIS — C61 Malignant neoplasm of prostate: Secondary | ICD-10-CM | POA: Diagnosis not present

## 2017-09-01 DIAGNOSIS — C61 Malignant neoplasm of prostate: Secondary | ICD-10-CM | POA: Diagnosis not present

## 2017-09-01 DIAGNOSIS — Z510 Encounter for antineoplastic radiation therapy: Secondary | ICD-10-CM | POA: Diagnosis not present

## 2017-09-05 DIAGNOSIS — D2261 Melanocytic nevi of right upper limb, including shoulder: Secondary | ICD-10-CM | POA: Diagnosis not present

## 2017-09-05 DIAGNOSIS — Z08 Encounter for follow-up examination after completed treatment for malignant neoplasm: Secondary | ICD-10-CM | POA: Diagnosis not present

## 2017-09-05 DIAGNOSIS — L538 Other specified erythematous conditions: Secondary | ICD-10-CM | POA: Diagnosis not present

## 2017-09-05 DIAGNOSIS — X32XXXA Exposure to sunlight, initial encounter: Secondary | ICD-10-CM | POA: Diagnosis not present

## 2017-09-05 DIAGNOSIS — C44619 Basal cell carcinoma of skin of left upper limb, including shoulder: Secondary | ICD-10-CM | POA: Diagnosis not present

## 2017-09-05 DIAGNOSIS — L570 Actinic keratosis: Secondary | ICD-10-CM | POA: Diagnosis not present

## 2017-09-05 DIAGNOSIS — L820 Inflamed seborrheic keratosis: Secondary | ICD-10-CM | POA: Diagnosis not present

## 2017-09-05 DIAGNOSIS — D225 Melanocytic nevi of trunk: Secondary | ICD-10-CM | POA: Diagnosis not present

## 2017-09-05 DIAGNOSIS — D2262 Melanocytic nevi of left upper limb, including shoulder: Secondary | ICD-10-CM | POA: Diagnosis not present

## 2017-09-05 DIAGNOSIS — Z85828 Personal history of other malignant neoplasm of skin: Secondary | ICD-10-CM | POA: Diagnosis not present

## 2017-09-05 DIAGNOSIS — D2272 Melanocytic nevi of left lower limb, including hip: Secondary | ICD-10-CM | POA: Diagnosis not present

## 2017-09-05 DIAGNOSIS — D485 Neoplasm of uncertain behavior of skin: Secondary | ICD-10-CM | POA: Diagnosis not present

## 2017-09-05 DIAGNOSIS — D2271 Melanocytic nevi of right lower limb, including hip: Secondary | ICD-10-CM | POA: Diagnosis not present

## 2017-09-09 DIAGNOSIS — Z510 Encounter for antineoplastic radiation therapy: Secondary | ICD-10-CM | POA: Diagnosis not present

## 2017-09-09 DIAGNOSIS — C61 Malignant neoplasm of prostate: Secondary | ICD-10-CM | POA: Diagnosis not present

## 2017-09-10 DIAGNOSIS — Z510 Encounter for antineoplastic radiation therapy: Secondary | ICD-10-CM | POA: Diagnosis not present

## 2017-09-10 DIAGNOSIS — C61 Malignant neoplasm of prostate: Secondary | ICD-10-CM | POA: Diagnosis not present

## 2017-09-11 DIAGNOSIS — C61 Malignant neoplasm of prostate: Secondary | ICD-10-CM | POA: Diagnosis not present

## 2017-09-11 DIAGNOSIS — Z510 Encounter for antineoplastic radiation therapy: Secondary | ICD-10-CM | POA: Diagnosis not present

## 2017-09-12 DIAGNOSIS — Z510 Encounter for antineoplastic radiation therapy: Secondary | ICD-10-CM | POA: Diagnosis not present

## 2017-09-12 DIAGNOSIS — C61 Malignant neoplasm of prostate: Secondary | ICD-10-CM | POA: Diagnosis not present

## 2017-09-15 DIAGNOSIS — C61 Malignant neoplasm of prostate: Secondary | ICD-10-CM | POA: Diagnosis not present

## 2017-09-15 DIAGNOSIS — Z510 Encounter for antineoplastic radiation therapy: Secondary | ICD-10-CM | POA: Diagnosis not present

## 2017-09-16 DIAGNOSIS — Z510 Encounter for antineoplastic radiation therapy: Secondary | ICD-10-CM | POA: Diagnosis not present

## 2017-09-16 DIAGNOSIS — C61 Malignant neoplasm of prostate: Secondary | ICD-10-CM | POA: Diagnosis not present

## 2017-09-17 DIAGNOSIS — Z510 Encounter for antineoplastic radiation therapy: Secondary | ICD-10-CM | POA: Diagnosis not present

## 2017-09-17 DIAGNOSIS — C61 Malignant neoplasm of prostate: Secondary | ICD-10-CM | POA: Diagnosis not present

## 2017-09-18 ENCOUNTER — Other Ambulatory Visit: Payer: Self-pay | Admitting: Family Medicine

## 2017-09-18 DIAGNOSIS — M5030 Other cervical disc degeneration, unspecified cervical region: Secondary | ICD-10-CM

## 2017-09-18 DIAGNOSIS — C61 Malignant neoplasm of prostate: Secondary | ICD-10-CM | POA: Diagnosis not present

## 2017-09-18 DIAGNOSIS — Z510 Encounter for antineoplastic radiation therapy: Secondary | ICD-10-CM | POA: Diagnosis not present

## 2017-09-19 DIAGNOSIS — C61 Malignant neoplasm of prostate: Secondary | ICD-10-CM | POA: Diagnosis not present

## 2017-09-19 DIAGNOSIS — Z510 Encounter for antineoplastic radiation therapy: Secondary | ICD-10-CM | POA: Diagnosis not present

## 2017-09-22 DIAGNOSIS — C61 Malignant neoplasm of prostate: Secondary | ICD-10-CM | POA: Diagnosis not present

## 2017-09-22 DIAGNOSIS — Z510 Encounter for antineoplastic radiation therapy: Secondary | ICD-10-CM | POA: Diagnosis not present

## 2017-09-23 DIAGNOSIS — Z510 Encounter for antineoplastic radiation therapy: Secondary | ICD-10-CM | POA: Diagnosis not present

## 2017-09-23 DIAGNOSIS — C61 Malignant neoplasm of prostate: Secondary | ICD-10-CM | POA: Diagnosis not present

## 2017-09-24 DIAGNOSIS — C61 Malignant neoplasm of prostate: Secondary | ICD-10-CM | POA: Diagnosis not present

## 2017-09-24 DIAGNOSIS — Z510 Encounter for antineoplastic radiation therapy: Secondary | ICD-10-CM | POA: Diagnosis not present

## 2017-09-25 DIAGNOSIS — C61 Malignant neoplasm of prostate: Secondary | ICD-10-CM | POA: Diagnosis not present

## 2017-09-25 DIAGNOSIS — Z510 Encounter for antineoplastic radiation therapy: Secondary | ICD-10-CM | POA: Diagnosis not present

## 2017-09-26 DIAGNOSIS — C61 Malignant neoplasm of prostate: Secondary | ICD-10-CM | POA: Diagnosis not present

## 2017-09-26 DIAGNOSIS — Z510 Encounter for antineoplastic radiation therapy: Secondary | ICD-10-CM | POA: Diagnosis not present

## 2017-09-30 DIAGNOSIS — C61 Malignant neoplasm of prostate: Secondary | ICD-10-CM | POA: Diagnosis not present

## 2017-09-30 DIAGNOSIS — Z510 Encounter for antineoplastic radiation therapy: Secondary | ICD-10-CM | POA: Diagnosis not present

## 2017-10-01 DIAGNOSIS — Z510 Encounter for antineoplastic radiation therapy: Secondary | ICD-10-CM | POA: Diagnosis not present

## 2017-10-01 DIAGNOSIS — C61 Malignant neoplasm of prostate: Secondary | ICD-10-CM | POA: Diagnosis not present

## 2017-10-02 DIAGNOSIS — Z510 Encounter for antineoplastic radiation therapy: Secondary | ICD-10-CM | POA: Diagnosis not present

## 2017-10-02 DIAGNOSIS — C61 Malignant neoplasm of prostate: Secondary | ICD-10-CM | POA: Diagnosis not present

## 2017-10-03 DIAGNOSIS — Z510 Encounter for antineoplastic radiation therapy: Secondary | ICD-10-CM | POA: Diagnosis not present

## 2017-10-03 DIAGNOSIS — C61 Malignant neoplasm of prostate: Secondary | ICD-10-CM | POA: Diagnosis not present

## 2017-10-06 DIAGNOSIS — Z510 Encounter for antineoplastic radiation therapy: Secondary | ICD-10-CM | POA: Diagnosis not present

## 2017-10-06 DIAGNOSIS — C61 Malignant neoplasm of prostate: Secondary | ICD-10-CM | POA: Diagnosis not present

## 2017-10-07 DIAGNOSIS — Z510 Encounter for antineoplastic radiation therapy: Secondary | ICD-10-CM | POA: Diagnosis not present

## 2017-10-07 DIAGNOSIS — C61 Malignant neoplasm of prostate: Secondary | ICD-10-CM | POA: Diagnosis not present

## 2017-10-08 DIAGNOSIS — C61 Malignant neoplasm of prostate: Secondary | ICD-10-CM | POA: Diagnosis not present

## 2017-10-08 DIAGNOSIS — Z510 Encounter for antineoplastic radiation therapy: Secondary | ICD-10-CM | POA: Diagnosis not present

## 2017-10-09 DIAGNOSIS — C61 Malignant neoplasm of prostate: Secondary | ICD-10-CM | POA: Diagnosis not present

## 2017-10-09 DIAGNOSIS — Z510 Encounter for antineoplastic radiation therapy: Secondary | ICD-10-CM | POA: Diagnosis not present

## 2017-10-10 DIAGNOSIS — C61 Malignant neoplasm of prostate: Secondary | ICD-10-CM | POA: Diagnosis not present

## 2017-10-10 DIAGNOSIS — Z510 Encounter for antineoplastic radiation therapy: Secondary | ICD-10-CM | POA: Diagnosis not present

## 2017-10-13 DIAGNOSIS — C61 Malignant neoplasm of prostate: Secondary | ICD-10-CM | POA: Diagnosis not present

## 2017-10-13 DIAGNOSIS — Z510 Encounter for antineoplastic radiation therapy: Secondary | ICD-10-CM | POA: Diagnosis not present

## 2017-10-14 DIAGNOSIS — C61 Malignant neoplasm of prostate: Secondary | ICD-10-CM | POA: Diagnosis not present

## 2017-10-14 DIAGNOSIS — Z510 Encounter for antineoplastic radiation therapy: Secondary | ICD-10-CM | POA: Diagnosis not present

## 2017-10-15 DIAGNOSIS — Z510 Encounter for antineoplastic radiation therapy: Secondary | ICD-10-CM | POA: Diagnosis not present

## 2017-10-15 DIAGNOSIS — C61 Malignant neoplasm of prostate: Secondary | ICD-10-CM | POA: Diagnosis not present

## 2017-10-16 DIAGNOSIS — Z510 Encounter for antineoplastic radiation therapy: Secondary | ICD-10-CM | POA: Diagnosis not present

## 2017-10-16 DIAGNOSIS — C61 Malignant neoplasm of prostate: Secondary | ICD-10-CM | POA: Diagnosis not present

## 2017-10-17 DIAGNOSIS — Z510 Encounter for antineoplastic radiation therapy: Secondary | ICD-10-CM | POA: Diagnosis not present

## 2017-10-17 DIAGNOSIS — C61 Malignant neoplasm of prostate: Secondary | ICD-10-CM | POA: Diagnosis not present

## 2017-10-30 DIAGNOSIS — C44619 Basal cell carcinoma of skin of left upper limb, including shoulder: Secondary | ICD-10-CM | POA: Diagnosis not present

## 2017-11-07 ENCOUNTER — Other Ambulatory Visit: Payer: Self-pay | Admitting: Family Medicine

## 2017-11-07 ENCOUNTER — Ambulatory Visit: Payer: Medicare Other

## 2017-11-07 DIAGNOSIS — I10 Essential (primary) hypertension: Secondary | ICD-10-CM

## 2017-11-07 DIAGNOSIS — Z23 Encounter for immunization: Secondary | ICD-10-CM

## 2017-11-17 ENCOUNTER — Other Ambulatory Visit: Payer: Self-pay | Admitting: Family Medicine

## 2017-11-17 DIAGNOSIS — M25559 Pain in unspecified hip: Secondary | ICD-10-CM

## 2017-11-17 DIAGNOSIS — I10 Essential (primary) hypertension: Secondary | ICD-10-CM

## 2017-11-17 DIAGNOSIS — G43909 Migraine, unspecified, not intractable, without status migrainosus: Secondary | ICD-10-CM

## 2017-12-05 ENCOUNTER — Ambulatory Visit (INDEPENDENT_AMBULATORY_CARE_PROVIDER_SITE_OTHER): Payer: Medicare Other | Admitting: Family Medicine

## 2017-12-05 ENCOUNTER — Encounter: Payer: Self-pay | Admitting: Family Medicine

## 2017-12-05 VITALS — BP 120/80 | HR 76 | Ht 69.0 in | Wt 165.0 lb

## 2017-12-05 DIAGNOSIS — G43909 Migraine, unspecified, not intractable, without status migrainosus: Secondary | ICD-10-CM

## 2017-12-05 DIAGNOSIS — I10 Essential (primary) hypertension: Secondary | ICD-10-CM

## 2017-12-05 NOTE — Progress Notes (Signed)
Patient: Marcus Gonzalez, Male    DOB: 07-02-1950, 67 y.o.   MRN: [id number] Visit Date: 12/05/2017  Today's Provider: Otilio Miu, MD   Chief Complaint  Patient presents with  . medicare annual wellness   Subjective:   Initial preventative physical exam Marcus Gonzalez is a 67 y.o. male who presents today for his Initial Preventative Physical Exam. He feels well. He reports exercising . He reports he is sleeping well.  Patient presents for annual wellness visits.    Review of Systems  Constitutional: Negative for chills and fever.  HENT: Negative for drooling, ear discharge, ear pain and sore throat.   Respiratory: Negative for cough, shortness of breath and wheezing.   Cardiovascular: Negative for chest pain, palpitations and leg swelling.  Gastrointestinal: Negative for abdominal pain, blood in stool, constipation, diarrhea and nausea.  Endocrine: Negative for polydipsia.  Genitourinary: Negative for dysuria, frequency, hematuria and urgency.  Musculoskeletal: Negative for back pain, myalgias and neck pain.  Skin: Negative for rash.  Allergic/Immunologic: Negative for environmental allergies.  Neurological: Negative for dizziness and headaches.  Hematological: Does not bruise/bleed easily.  Psychiatric/Behavioral: Negative for suicidal ideas. The patient is not nervous/anxious.     Social History   Socioeconomic History  . Marital status: Single    Spouse name: Not on file  . Number of children: 0  . Years of education: Not on file  . Highest education level: Doctorate  Occupational History    Employer: [employer]   Social Needs  . Financial resource strain: Not hard at all  . Food insecurity:    Worry: Never true    Inability: Never true  . Transportation needs:    Medical: No    Non-medical: No  Tobacco Use  . Smoking status: Former Smoker    Packs/day: 1.00    Years: 29.00    Pack years: 29.00    Types: Cigarettes    Last attempt to quit: 1999    Years since  quitting: 20.8  . Smokeless tobacco: Never Used  . Tobacco comment: smoking cessation materials not required  Substance and Sexual Activity  . Alcohol use: Yes    Alcohol/week: 14.0 standard drinks    Types: 14 Glasses of wine per week  . Drug use: No  . Sexual activity: Not Currently  Lifestyle  . Physical activity:    Days per week: 4 days    Minutes per session: 60 min  . Stress: Not at all  Relationships  . Social connections:    Talks on phone: Patient refused    Gets together: Patient refused    Attends religious service: Patient refused    Active member of club or organization: Patient refused    Attends meetings of clubs or organizations: Patient refused    Relationship status: Patient refused  . Intimate partner violence:    Fear of current or ex partner: No    Emotionally abused: No    Physically abused: No    Forced sexual activity: No  Other Topics Concern  . Not on file  Social History Narrative  . Not on file    Patient Active Problem List   Diagnosis Date Noted  . Encounter for screening colonoscopy   . Benign neoplasm of ascending colon   . Polyp of sigmoid colon   . Rectal polyp   . Hyperlipidemia 11/29/2016  . Degeneration, intervertebral disc, cervical 04/19/2016  . Migraine without status migrainosus, not intractable 04/19/2016  . Essential hypertension 04/19/2016  .  Cephalalgia 05/24/2014  . Routine general medical examination at a health care facility 05/24/2014  . Anxiety attack 05/24/2014  . Arthritis 05/24/2014  . Benign prostatic hyperplasia without lower urinary tract symptoms 05/24/2014  . Narrowing of intervertebral disc space 05/24/2014  . History of migraine headaches 05/24/2014    Past Surgical History:  Procedure Laterality Date  . BASAL CELL CARCINOMA EXCISION     arm and leg  . COLONOSCOPY  2013   cleared for 5 yrs- Dr Allen Norris  . COLONOSCOPY WITH PROPOFOL N/A 08/22/2017   Procedure: COLONOSCOPY WITH PROPOFOL;  Surgeon: Lucilla Lame, MD;  Location: [facility];  Service: Endoscopy;  Laterality: N/A;  . POLYPECTOMY  08/22/2017   Procedure: POLYPECTOMY INTESTINAL;  Surgeon: Lucilla Lame, MD;  Location: [facility];  Service: Endoscopy;;  . skin ca removed      His family history includes Cancer in his maternal grandmother; Dementia in his mother; Diabetes in his maternal aunt; Heart disease in his father and mother; Other in his brother.     Previous Medications   AMLODIPINE (NORVASC) 2.5 MG TABLET    TAKE 1 TABLET BY MOUTH DAILY   BICALUTAMIDE (CASODEX) 50 MG TABLET    Take 50 mg by mouth daily.   CYCLOBENZAPRINE (FLEXERIL) 10 MG TABLET    TAKE 1 TABLET BY MOUTH THREE TIMES DAILY AS NEEDED FOR MUSCLE SPASMS   ETODOLAC (LODINE) 500 MG TABLET    TAKE 1 TABLET BY MOUTH DAILY IN THE MORNING. TAKE 1 MORE TABLET IN THE EVENING AS NEEDED FOR BREAKTHROUGH PAIN   LISINOPRIL (PRINIVIL,ZESTRIL) 20 MG TABLET    TAKE 1 TABLET BY MOUTH DAILY   PROPRANOLOL ER (INDERAL LA) 80 MG 24 HR CAPSULE    TAKE ONE CAPSULE BY MOUTH DAILY    Patient Care Team: Juline Patch, MD as PCP - General (Family Medicine) Murrell Redden, MD as Consulting Physician (Urology) Dasher, Rayvon Char, MD as Consulting Physician (Dermatology) Tamsen Meek, MD as Consulting Physician (Dermatology)      Objective:   Vitals: BP 120/80   Pulse 76   Ht 5\' 9"  (1.753 m)   Wt 165 lb (74.8 kg)   BMI 24.37 kg/m   Physical Exam  Constitutional: He is oriented to person, place, and time.  HENT:  Head: Normocephalic.  Right Ear: External ear normal.  Left Ear: External ear normal.  Nose: Nose normal.  Mouth/Throat: Oropharynx is clear and moist.  Eyes: Pupils are equal, round, and reactive to light. Conjunctivae and EOM are normal. Right eye exhibits no discharge. Left eye exhibits no discharge. No scleral icterus.  Neck: Normal range of motion. Neck supple. No JVD present. No tracheal deviation present. No thyromegaly present.   Cardiovascular: Normal rate, regular rhythm, normal heart sounds and intact distal pulses. Exam reveals no gallop and no friction rub.  No murmur heard. Pulmonary/Chest: Breath sounds normal. No respiratory distress. He has no wheezes. He has no rales.  Abdominal: Soft. Bowel sounds are normal. He exhibits no mass. There is no hepatosplenomegaly. There is no tenderness. There is no rebound, no guarding and no CVA tenderness.  Musculoskeletal: Normal range of motion. He exhibits no edema or tenderness.  Lymphadenopathy:    He has no cervical adenopathy.  Neurological: He is alert and oriented to person, place, and time. He has normal strength and normal reflexes. No cranial nerve deficit.  Skin: Skin is warm. No rash noted.  Nursing note and vitals reviewed.    No  exam data present  Activities of Daily Living In your present state of health, do you have any difficulty performing the following activities: 08/22/2017 05/28/2017  Hearing? N N  Comment - denies hearing aids  Vision? N N  Comment - wears reading eyeglasses  Difficulty concentrating or making decisions? N N  Walking or climbing stairs? N N  Dressing or bathing? N N  Doing errands, shopping? - Scientist, forensic and eating ? - N  Comment - denies dentures  Using the Toilet? - N  In the past six months, have you accidently leaked urine? - N  Do you have problems with loss of bowel control? - N  Managing your Medications? - N  Managing your Finances? - N  Housekeeping or managing your Housekeeping? - N  Some recent data might be hidden    Fall Risk Assessment Fall Risk  12/05/2017 05/28/2017 11/29/2016 06/09/2015 04/07/2015  Falls in the past year? 0 No No No No  Follow up Falls evaluation completed - - - -     Patient reports there are not safety devices in place in shower at home.   Depression Screen PHQ 2/9 Scores 12/05/2017 05/28/2017 11/29/2016 06/09/2015  PHQ - 2 Score 0 0 0 0  PHQ- 9 Score 0 0 0 -    Cognitive  Testing - 6-CIT   Correct? Score   What year is it? yes 0 Yes = 0    No = 4  What month is it? yes 0 Yes = 0    No = 3  Remember:     [address]ia [address], [place], [place]     What time is it? yes 0 Yes = 0    No = 3  Count backwards from 20 to 1 yes 0 Correct = 0    1 error = 2   More than 1 error = 4  Say the months of the year in reverse. yes 0 Correct = 0    1 error = 2   More than 1 error = 4  What address did I ask you to remember? yes 0 Correct = 0  1 error = 2    2 error = 4    3 error = 6    4 error = 8    All wrong = 10       TOTAL SCORE  0/28   Interpretation:  Normal  Normal (0-7) Abnormal (8-28)     Assessment & Plan:     Initial Preventative Physical Exam  Reviewed patient's Family Medical History Reviewed and updated list of patient's medical providers Assessment of cognitive impairment was done Assessed patient's functional ability Established a written schedule for health screening [place] Completed and Reviewed  Exercise Activities and Dietary recommendations Goals    . DIET - INCREASE WATER INTAKE     Recommend to drink at least 6-8 8oz glasses of water per day.       Immunization History  Administered Date(s) Administered  . Influenza, High Dose Seasonal PF 11/29/2016  . Influenza, Seasonal, Injecte, Preservative Fre 11/06/2010  . Influenza,inj,Quad PF,6+ Mos 11/23/2012, 10/29/2013, 11/22/2014, 11/13/2015  . Influenza-Unspecified 10/29/2013, 10/28/2016  . Pneumococcal Conjugate-13 05/28/2017  . Tdap 04/03/2010    Health Maintenance  Topic Date Due  . Samul Dada  04/02/2020  . COLONOSCOPY  08/23/2022  . INFLUENZA VACCINE  Completed  . PNA vac Low Risk Adult  Completed  . Hepatitis C Screening  Discontinued  Discussed health benefits of physical activity, and encouraged him to engage in regular exercise appropriate for his age and condition.     ------------------------------------------------------------------------------------------------------------  Problem List Items Addressed This Visit    None    1. Encounter for Medicare annual wellness exam Annual wellness subsequent visit With all criterea addressed.   Otilio Miu, MD [facility]  12/05/2017

## 2017-12-08 DIAGNOSIS — L820 Inflamed seborrheic keratosis: Secondary | ICD-10-CM | POA: Diagnosis not present

## 2017-12-11 MED ORDER — AMLODIPINE BESYLATE 2.5 MG PO TABS: 2.5000 mg | ORAL_TABLET | Freq: Every day | ORAL | 0 refills | Status: DC

## 2017-12-11 MED ORDER — LISINOPRIL 20 MG PO TABS
20.0000 mg | ORAL_TABLET | Freq: Every day | ORAL | 0 refills | Status: DC
Start: 2017-12-11 — End: 2018-05-12

## 2017-12-11 MED ORDER — PROPRANOLOL HCL ER 80 MG PO CP24: 80.0000 mg | ORAL_CAPSULE | Freq: Every day | ORAL | 0 refills | Status: DC

## 2017-12-11 NOTE — Addendum Note (Signed)
Addended by: Fredderick Severance on: 12/11/2017 05:49 PM   Modules accepted: Orders, Level of Service

## 2017-12-12 ENCOUNTER — Ambulatory Visit: Payer: Medicare Other | Admitting: Family Medicine

## 2018-02-02 DIAGNOSIS — M79671 Pain in right foot: Secondary | ICD-10-CM | POA: Diagnosis not present

## 2018-02-02 DIAGNOSIS — S92334A Nondisplaced fracture of third metatarsal bone, right foot, initial encounter for closed fracture: Secondary | ICD-10-CM | POA: Diagnosis not present

## 2018-02-05 DIAGNOSIS — C61 Malignant neoplasm of prostate: Secondary | ICD-10-CM | POA: Diagnosis not present

## 2018-02-05 DIAGNOSIS — Z510 Encounter for antineoplastic radiation therapy: Secondary | ICD-10-CM | POA: Diagnosis not present

## 2018-02-05 DIAGNOSIS — Z6823 Body mass index (BMI) 23.0-23.9, adult: Secondary | ICD-10-CM | POA: Diagnosis not present

## 2018-02-10 DIAGNOSIS — L821 Other seborrheic keratosis: Secondary | ICD-10-CM | POA: Diagnosis not present

## 2018-02-10 DIAGNOSIS — D2262 Melanocytic nevi of left upper limb, including shoulder: Secondary | ICD-10-CM | POA: Diagnosis not present

## 2018-02-10 DIAGNOSIS — D2261 Melanocytic nevi of right upper limb, including shoulder: Secondary | ICD-10-CM | POA: Diagnosis not present

## 2018-02-10 DIAGNOSIS — D2272 Melanocytic nevi of left lower limb, including hip: Secondary | ICD-10-CM | POA: Diagnosis not present

## 2018-02-10 DIAGNOSIS — Z08 Encounter for follow-up examination after completed treatment for malignant neoplasm: Secondary | ICD-10-CM | POA: Diagnosis not present

## 2018-02-10 DIAGNOSIS — Z85828 Personal history of other malignant neoplasm of skin: Secondary | ICD-10-CM | POA: Diagnosis not present

## 2018-02-10 DIAGNOSIS — D2271 Melanocytic nevi of right lower limb, including hip: Secondary | ICD-10-CM | POA: Diagnosis not present

## 2018-02-10 DIAGNOSIS — L570 Actinic keratosis: Secondary | ICD-10-CM | POA: Diagnosis not present

## 2018-02-10 DIAGNOSIS — X32XXXA Exposure to sunlight, initial encounter: Secondary | ICD-10-CM | POA: Diagnosis not present

## 2018-02-10 DIAGNOSIS — D485 Neoplasm of uncertain behavior of skin: Secondary | ICD-10-CM | POA: Diagnosis not present

## 2018-02-10 DIAGNOSIS — D225 Melanocytic nevi of trunk: Secondary | ICD-10-CM | POA: Diagnosis not present

## 2018-02-10 DIAGNOSIS — C44729 Squamous cell carcinoma of skin of left lower limb, including hip: Secondary | ICD-10-CM | POA: Diagnosis not present

## 2018-02-15 ENCOUNTER — Other Ambulatory Visit: Payer: Self-pay | Admitting: Family Medicine

## 2018-02-15 DIAGNOSIS — M25559 Pain in unspecified hip: Secondary | ICD-10-CM

## 2018-03-04 DIAGNOSIS — C44729 Squamous cell carcinoma of skin of left lower limb, including hip: Secondary | ICD-10-CM | POA: Diagnosis not present

## 2018-05-12 ENCOUNTER — Other Ambulatory Visit: Payer: Self-pay

## 2018-05-12 ENCOUNTER — Other Ambulatory Visit: Payer: Self-pay | Admitting: Family Medicine

## 2018-05-12 DIAGNOSIS — M5030 Other cervical disc degeneration, unspecified cervical region: Secondary | ICD-10-CM

## 2018-05-12 DIAGNOSIS — G43909 Migraine, unspecified, not intractable, without status migrainosus: Secondary | ICD-10-CM

## 2018-05-12 DIAGNOSIS — M25559 Pain in unspecified hip: Secondary | ICD-10-CM

## 2018-05-12 DIAGNOSIS — I10 Essential (primary) hypertension: Secondary | ICD-10-CM

## 2018-05-12 MED ORDER — LISINOPRIL 20 MG PO TABS: 20.0000 mg | ORAL_TABLET | Freq: Every day | ORAL | 0 refills | Status: DC

## 2018-05-12 MED ORDER — ETODOLAC 500 MG PO TABS: ORAL_TABLET | ORAL | 0 refills | Status: DC

## 2018-05-12 MED ORDER — CYCLOBENZAPRINE HCL 10 MG PO TABS: 10.0000 mg | ORAL_TABLET | Freq: Three times a day (TID) | ORAL | 0 refills | Status: DC | PRN

## 2018-05-12 MED ORDER — AMLODIPINE BESYLATE 2.5 MG PO TABS: 2.5000 mg | ORAL_TABLET | Freq: Every day | ORAL | 0 refills | Status: DC

## 2018-06-03 ENCOUNTER — Ambulatory Visit (INDEPENDENT_AMBULATORY_CARE_PROVIDER_SITE_OTHER): Payer: Medicare Other

## 2018-06-03 ENCOUNTER — Other Ambulatory Visit: Payer: Self-pay

## 2018-06-03 VITALS — BP 112/70 | HR 70 | Ht 69.0 in | Wt 165.0 lb

## 2018-06-03 DIAGNOSIS — M25559 Pain in unspecified hip: Secondary | ICD-10-CM | POA: Insufficient documentation

## 2018-06-03 DIAGNOSIS — Z0000 Encounter for general adult medical examination without abnormal findings: Secondary | ICD-10-CM | POA: Diagnosis not present

## 2018-06-03 DIAGNOSIS — M19079 Primary osteoarthritis, unspecified ankle and foot: Secondary | ICD-10-CM | POA: Insufficient documentation

## 2018-06-03 DIAGNOSIS — Z23 Encounter for immunization: Secondary | ICD-10-CM

## 2018-06-03 NOTE — Patient Instructions (Addendum)
Mr. Ransford , Thank you for taking time to come for your Medicare Wellness Visit. I appreciate your ongoing commitment to your health goals. Please review the following plan we discussed and let me know if I can assist you in the future.   Screening recommendations/referrals: Colonoscopy: done 08/20/17. Repeat in 2024. Recommended yearly ophthalmology/optometry visit for glaucoma screening and checkup Recommended yearly dental visit for hygiene and checkup  Vaccinations: Influenza vaccine: done 11/07/17 Pneumococcal vaccine: done today Tdap vaccine: done 04/03/10 Shingles vaccine: Shingrix discussed. Please contact your pharmacy for coverage information.   Advanced directives: Please bring a copy of your health care power of attorney and living will to the office at your convenience.  Conditions/risks identified: Keep up the great work!  Next appointment: Please follow up in one year for your Medicare Annual Wellness visit.    Preventive Care 69 Years and Older, Male Preventive care refers to lifestyle choices and visits with your health care provider that can promote health and wellness. What does preventive care include?  A yearly physical exam. This is also called an annual well check.  Dental exams once or twice a year.  Routine eye exams. Ask your health care provider how often you should have your eyes checked.  Personal lifestyle choices, including:  Daily care of your teeth and gums.  Regular physical activity.  Eating a healthy diet.  Avoiding tobacco and drug use.  Limiting alcohol use.  Practicing safe sex.  Taking low doses of aspirin every day.  Taking vitamin and mineral supplements as recommended by your health care provider. What happens during an annual well check? The services and screenings done by your health care provider during your annual well check will depend on your age, overall health, lifestyle risk factors, and family history of  disease. Counseling  Your health care provider may ask you questions about your:  Alcohol use.  Tobacco use.  Drug use.  Emotional well-being.  Home and relationship well-being.  Sexual activity.  Eating habits.  History of falls.  Memory and ability to understand (cognition).  Work and work Statistician. Screening  You may have the following tests or measurements:  Height, weight, and BMI.  Blood pressure.  Lipid and cholesterol levels. These may be checked every 5 years, or more frequently if you are over 69 years old.  Skin check.  Lung cancer screening. You may have this screening every year starting at age 69 if you have a 30-pack-year history of smoking and currently smoke or have quit within the past 15 years.  Fecal occult blood test (FOBT) of the stool. You may have this test every year starting at age 69.  Flexible sigmoidoscopy or colonoscopy. You may have a sigmoidoscopy every 5 years or a colonoscopy every 10 years starting at age 69.  Prostate cancer screening. Recommendations will vary depending on your family history and other risks.  Hepatitis C blood test.  Hepatitis B blood test.  Sexually transmitted disease (STD) testing.  Diabetes screening. This is done by checking your blood sugar (glucose) after you have not eaten for a while (fasting). You may have this done every 1-3 years.  Abdominal aortic aneurysm (AAA) screening. You may need this if you are a current or former smoker.  Osteoporosis. You may be screened starting at age 13 if you are at high risk. Talk with your health care provider about your test results, treatment options, and if necessary, the need for more tests. Vaccines  Your health care provider  may recommend certain vaccines, such as:  Influenza vaccine. This is recommended every year.  Tetanus, diphtheria, and acellular pertussis (Tdap, Td) vaccine. You may need a Td booster every 10 years.  Zoster vaccine. You may  need this after age 69.  Pneumococcal 13-valent conjugate (PCV13) vaccine. One dose is recommended after age 69.  Pneumococcal polysaccharide (PPSV23) vaccine. One dose is recommended after age 69. Talk to your health care provider about which screenings and vaccines you need and how often you need them. This information is not intended to replace advice given to you by your health care provider. Make sure you discuss any questions you have with your health care provider. Document Released: 02/10/2015 Document Revised: 10/04/2015 Document Reviewed: 11/15/2014 Elsevier Interactive Patient Education  2017 [place] Prevention in the Home Falls can cause injuries. They can happen to people of all ages. There are many things you can do to make your home safe and to help prevent falls. What can I do on the outside of my home?  Regularly fix the edges of walkways and driveways and fix any cracks.  Remove anything that might make you trip as you walk through a door, such as a raised step or threshold.  Trim any bushes or trees on the path to your home.  Use bright outdoor lighting.  Clear any walking paths of anything that might make someone trip, such as rocks or tools.  Regularly check to see if handrails are loose or broken. Make sure that both sides of any steps have handrails.  Any raised decks and porches should have guardrails on the edges.  Have any leaves, snow, or ice cleared regularly.  Use sand or salt on walking paths during winter.  Clean up any spills in your garage right away. This includes oil or grease spills. What can I do in the bathroom?  Use night lights.  Install grab bars by the toilet and in the tub and shower. Do not use towel bars as grab bars.  Use non-skid mats or decals in the tub or shower.  If you need to sit down in the shower, use a plastic, non-slip stool.  Keep the floor dry. Clean up any water that spills on the floor as soon as it  happens.  Remove soap buildup in the tub or shower regularly.  Attach bath mats securely with double-sided non-slip rug tape.  Do not have throw rugs and other things on the floor that can make you trip. What can I do in the bedroom?  Use night lights.  Make sure that you have a light by your bed that is easy to reach.  Do not use any sheets or blankets that are too big for your bed. They should not hang down onto the floor.  Have a firm chair that has side arms. You can use this for support while you get dressed.  Do not have throw rugs and other things on the floor that can make you trip. What can I do in the kitchen?  Clean up any spills right away.  Avoid walking on wet floors.  Keep items that you use a lot in easy-to-reach places.  If you need to reach something above you, use a strong step stool that has a grab bar.  Keep electrical cords out of the way.  Do not use floor polish or wax that makes floors slippery. If you must use wax, use non-skid floor wax.  Do not have throw rugs  and other things on the floor that can make you trip. What can I do with my stairs?  Do not leave any items on the stairs.  Make sure that there are handrails on both sides of the stairs and use them. Fix handrails that are broken or loose. Make sure that handrails are as long as the stairways.  Check any carpeting to make sure that it is firmly attached to the stairs. Fix any carpet that is loose or worn.  Avoid having throw rugs at the top or bottom of the stairs. If you do have throw rugs, attach them to the floor with carpet tape.  Make sure that you have a light switch at the top of the stairs and the bottom of the stairs. If you do not have them, ask someone to add them for you. What else can I do to help prevent falls?  Wear shoes that:  Do not have high heels.  Have rubber bottoms.  Are comfortable and fit you well.  Are closed at the toe. Do not wear sandals.  If you  use a stepladder:  Make sure that it is fully opened. Do not climb a closed stepladder.  Make sure that both sides of the stepladder are locked into place.  Ask someone to hold it for you, if possible.  Clearly mark and make sure that you can see:  Any grab bars or handrails.  First and last steps.  Where the edge of each step is.  Use tools that help you move around (mobility aids) if they are needed. These include:  Canes.  Walkers.  Scooters.  Crutches.  Turn on the lights when you go into a dark area. Replace any light bulbs as soon as they burn out.  Set up your furniture so you have a clear path. Avoid moving your furniture around.  If any of your floors are uneven, fix them.  If there are any pets around you, be aware of where they are.  Review your medicines with your doctor. Some medicines can make you feel dizzy. This can increase your chance of falling. Ask your doctor what other things that you can do to help prevent falls. This information is not intended to replace advice given to you by your health care provider. Make sure you discuss any questions you have with your health care provider. Document Released: 11/10/2008 Document Revised: 06/22/2015 Document Reviewed: 02/18/2014 Elsevier Interactive Patient Education  2017 [place].    Pneumococcal Vaccine, Polyvalent solution for injection What is this medicine? PNEUMOCOCCAL VACCINE, POLYVALENT (NEU mo KOK al vak SEEN, pol ee VEY luhnt) is a vaccine to prevent pneumococcus bacteria infection. These bacteria are a major cause of ear infections, Strep throat infections, and serious pneumonia, meningitis, or blood infections worldwide. These vaccines help the body to produce antibodies (protective substances) that help your body defend against these bacteria. This vaccine is recommended for people 65 years of age and older with health problems. It is also recommended for all adults over 27 years old. This  vaccine will not treat an infection. This medicine may be used for other purposes; ask your health care provider or pharmacist if you have questions. COMMON BRAND NAME(S): Pneumovax 23 What should I tell my health care provider before I take this medicine? They need to know if you have any of these conditions: -bleeding problems -bone marrow or organ transplant -cancer, Hodgkin's disease -fever -infection -immune system problems -low platelet count in the blood -seizures -  an unusual or allergic reaction to pneumococcal vaccine, diphtheria toxoid, other vaccines, latex, other medicines, foods, dyes, or preservatives -pregnant or trying to get pregnant -breast-feeding How should I use this medicine? This vaccine is for injection into a muscle or under the skin. It is given by a health care professional. A copy of Vaccine Information Statements will be given before each vaccination. Read this sheet carefully each time. The sheet may change frequently. Talk to your pediatrician regarding the use of this medicine in children. While this drug may be prescribed for children as young as 36 years of age for selected conditions, precautions do apply. Overdosage: If you think you have taken too much of this medicine contact a poison control center or emergency room at once. NOTE: This medicine is only for you. Do not share this medicine with others. What if I miss a dose? It is important not to miss your dose. Call your doctor or health care professional if you are unable to keep an appointment. What may interact with this medicine? -medicines for cancer chemotherapy -medicines that suppress your immune function -medicines that treat or prevent blood clots like warfarin, enoxaparin, and dalteparin -steroid medicines like prednisone or cortisone This list may not describe all possible interactions. Give your health care provider a list of all the medicines, herbs, non-prescription drugs, or dietary  supplements you use. Also tell them if you smoke, drink alcohol, or use illegal drugs. Some items may interact with your medicine. What should I watch for while using this medicine? Mild fever and pain should go away in 3 days or less. Report any unusual symptoms to your doctor or health care professional. What side effects may I notice from receiving this medicine? Side effects that you should report to your doctor or health care professional as soon as possible: -allergic reactions like skin rash, itching or hives, swelling of the face, lips, or tongue -breathing problems -confused -fever over 102 degrees F -pain, tingling, numbness in the hands or feet -seizures -unusual bleeding or bruising -unusual muscle weakness Side effects that usually do not require medical attention (report to your doctor or health care professional if they continue or are bothersome): -aches and pains -diarrhea -fever of 102 degrees F or less -headache -irritable -loss of appetite -pain, tender at site where injected -trouble sleeping This list may not describe all possible side effects. Call your doctor for medical advice about side effects. You may report side effects to FDA at 1-800-FDA-1088. Where should I keep my medicine? This does not apply. This vaccine is given in a clinic, pharmacy, doctor's office, or other health care setting and will not be stored at home. NOTE: This sheet is a summary. It may not cover all possible information. If you have questions about this medicine, talk to your doctor, pharmacist, or health care provider.  2019 Elsevier/Gold Standard (2007-08-21 14:32:37)

## 2018-06-03 NOTE — Progress Notes (Signed)
Subjective:   Marcus Gonzalez is a 68 y.o. male who presents for Medicare Annual/Subsequent preventive examination.  Review of Systems:   Cardiac Risk Factors include: advanced age (>63men, >29 women);hypertension;male gender     Objective:    Vitals: BP 112/70 (BP Location: Left Arm, Patient Position: Sitting, Cuff Size: Normal)   Pulse 70   Ht 5\' 9"  (1.753 m)   Wt 165 lb (74.8 kg)   BMI 24.37 kg/m   Body mass index is 24.37 kg/m.  Advanced Directives 06/03/2018 12/05/2017 08/22/2017 05/28/2017 10/07/2014  Does Patient Have a Medical Advance Directive? Yes Yes Yes Yes Yes  Type of Advance Directive Living will;Healthcare Power of [place];Living will [place];Living will [place];Living will [place];Living will  Does patient want to make changes to medical advance directive? - No - Patient declined No - Patient declined - -  Copy of [place] in Chart? No - copy requested No - copy requested No - copy requested No - copy requested No - copy requested    Tobacco Social History   Tobacco Use  Smoking Status Former Smoker  . Packs/day: 1.00  . Years: 29.00  . Pack years: 29.00  . Types: Cigarettes  . Last attempt to quit: 1999  . Years since quitting: 21.3  Smokeless Tobacco Never Used  Tobacco Comment   smoking cessation materials not required     Counseling given: Not Answered Comment: smoking cessation materials not required   Clinical Intake:  Pre-visit preparation completed: Yes  Pain : No/denies pain     BMI - recorded: 24.37 Nutritional Status: BMI of 19-24  Normal Nutritional Risks: None Diabetes: No  How often do you need to have someone help you when you read instructions, pamphlets, or other written materials from your doctor or pharmacy?: 1 - Never  Interpreter Needed?: No  Information entered by :: Clemetine Marker LPN  Past Medical History:   Diagnosis Date  . BPH (benign prostatic hyperplasia)   . Degenerative disc disease, cervical   . Headache   . Hip pain   . Migraine   . Prostate cancer [facility])    Past Surgical History:  Procedure Laterality Date  . BASAL CELL CARCINOMA EXCISION     arm and leg  . COLONOSCOPY  2013   cleared for 5 yrs- Dr Allen Norris  . COLONOSCOPY WITH PROPOFOL N/A 08/22/2017   Procedure: COLONOSCOPY WITH PROPOFOL;  Surgeon: Lucilla Lame, MD;  Location: [place];  Service: Endoscopy;  Laterality: N/A;  . POLYPECTOMY  08/22/2017   Procedure: POLYPECTOMY INTESTINAL;  Surgeon: Lucilla Lame, MD;  Location: [place];  Service: Endoscopy;;  . skin ca removed     Family History  Problem Relation Age of Onset  . Heart disease Father   . Diabetes Maternal Aunt   . Cancer Maternal Grandmother        colon cancer  . Heart disease Mother   . Dementia Mother   . Other Brother        "bladder issues"   Social History   Socioeconomic History  . Marital status: Single    Spouse name: Not on file  . Number of children: 0  . Years of education: Not on file  . Highest education level: Doctorate  Occupational History    Employer: [employer]   Social Needs  . Financial resource strain: Not hard at all  . Food insecurity:  Worry: Never true    Inability: Never true  . Transportation needs:    Medical: No    Non-medical: No  Tobacco Use  . Smoking status: Former Smoker    Packs/day: 1.00    Years: 29.00    Pack years: 29.00    Types: Cigarettes    Last attempt to quit: 1999    Years since quitting: 21.3  . Smokeless tobacco: Never Used  . Tobacco comment: smoking cessation materials not required  Substance and Sexual Activity  . Alcohol use: Yes    Alcohol/week: 14.0 standard drinks    Types: 14 Glasses of wine per week  . Drug use: No  . Sexual activity: Not Currently  Lifestyle  . Physical activity:    Days per week: 6 days    Minutes per session: 60 min  . Stress: Not at all   Relationships  . Social connections:    Talks on phone: More than three times a week    Gets together: More than three times a week    Attends religious service: More than 4 times per year    Active member of club or organization: No    Attends meetings of clubs or organizations: Never    Relationship status: Never married  Other Topics Concern  . Not on file  Social History Narrative  . Not on file    Outpatient Encounter Medications as of 06/03/2018  Medication Sig  . amLODipine (NORVASC) 2.5 MG tablet Take 1 tablet (2.5 mg total) by mouth daily.  Marland Kitchen Apple Cider Vinegar 188 MG CAPS Take by mouth.  . Ascorbic Acid (VITAMIN C) 100 MG tablet Take by mouth.  . B Complex Vitamins (VITAMIN B-COMPLEX) TABS Take by mouth.  Raelyn Ensign Pollen 1000 MG TABS Take by mouth.  . Calcium Citrate 1040 MG TABS Take by mouth.  . Cinnamon 500 MG capsule Take by mouth.  . Cranberry 200 MG CAPS Take by mouth.  . cyclobenzaprine (FLEXERIL) 10 MG tablet Take 1 tablet (10 mg total) by mouth 3 (three) times daily as needed. for muscle spams  . etodolac (LODINE) 500 MG tablet One twice a day  . Flaxseed, Linseed, (FLAXSEED OIL PO) Take by mouth.  . Glucosamine-Chondroitin (GLUCOSAMINE CHONDR COMPLEX PO) Take by mouth.  Marland Kitchen lisinopril (PRINIVIL,ZESTRIL) 20 MG tablet Take 1 tablet (20 mg total) by mouth daily.  . Multiple Vitamins-Minerals (MULTIVITAMIN ADULT PO) Take by mouth.  . propranolol ER (INDERAL LA) 80 MG 24 hr capsule TAKE ONE CAPSULE BY MOUTH DAILY  . [DISCONTINUED] bicalutamide (CASODEX) 50 MG tablet Take 50 mg by mouth daily.   No facility-administered encounter medications on file as of 06/03/2018.     Activities of Daily Living In your present state of health, do you have any difficulty performing the following activities: 06/03/2018 12/05/2017  Hearing? N N  Comment declines hearing aids -  Vision? N N  Comment reading glasses -  Difficulty concentrating or making decisions? N N  Walking or  climbing stairs? N N  Dressing or bathing? N N  Doing errands, shopping? N N  Preparing Food and eating ? N N  Using the Toilet? N N  In the past six months, have you accidently leaked urine? N N  Do you have problems with loss of bowel control? N N  Managing your Medications? N N  Managing your Finances? N N  Housekeeping or managing your Housekeeping? N N  Some recent data might be hidden  Patient Care Team: Juline Patch, MD as PCP - General (Family Medicine) Murrell Redden, MD as Consulting Physician (Urology) Dasher, Rayvon Char, MD as Consulting Physician (Dermatology) Tamsen Meek, MD as Consulting Physician (Dermatology)   Assessment:   This is a routine wellness examination for [place].  Exercise Activities and Dietary recommendations Current Exercise Habits: Home exercise routine, Type of exercise: strength training/weights;Other - see comments(exercise bike), Time (Minutes): 60, Frequency (Times/Week): 6, Weekly Exercise (Minutes/Week): 360, Intensity: Moderate, Exercise limited by: None identified  Goals    . DIET - INCREASE WATER INTAKE     Recommend to drink at least 6-8 8oz glasses of water per day.       Fall Risk Fall Risk  06/03/2018 12/05/2017 05/28/2017 11/29/2016 06/09/2015  Falls in the past year? 1 0 No No No  Number falls in past yr: 1 - - - -  Injury with Fall? 1 - - - -  Comment broken bone in foot 12/2017 - - - -  Follow up Falls prevention discussed Falls evaluation completed - - -   FALL RISK PREVENTION PERTAINING TO THE HOME:  Any stairs in or around the home? Yes  If so, do they handrails? Yes   Home free of loose throw rugs in walkways, pet beds, electrical cords, etc? Yes  Adequate lighting in your home to reduce risk of falls? Yes   ASSISTIVE DEVICES UTILIZED TO PREVENT FALLS:  Life alert? No  Use of a cane, walker or w/c? No  Grab bars in the bathroom? No  Shower chair or bench in shower? No  Elevated toilet seat or a  handicapped toilet? No   DME ORDERS:  DME order needed?  No   TIMED UP AND GO:  Was the test performed? Yes .  Length of time to ambulate 10 feet: 5 sec.   GAIT:  Appearance of gait: Gait stead-fast and without the use of an assistive device.   Education: Fall risk prevention has been discussed.  Intervention(s) required? No   Depression Screen PHQ 2/9 Scores 06/03/2018 12/05/2017 05/28/2017 11/29/2016  PHQ - 2 Score 0 0 0 0  PHQ- 9 Score - 0 0 0    Cognitive Function     6CIT Screen 06/03/2018 12/05/2017 05/28/2017  What Year? 0 points 0 points 0 points  What month? 0 points 0 points 0 points  What time? 0 points 0 points 0 points  Count back from 20 0 points 0 points 0 points  Months in reverse 0 points 0 points 0 points  Repeat phrase 0 points 0 points 0 points  Total Score 0 0 0    Immunization History  Administered Date(s) Administered  . Influenza, High Dose Seasonal PF 11/29/2016  . Influenza, Seasonal, Injecte, Preservative Fre 11/06/2010  . Influenza,inj,Quad PF,6+ Mos 11/23/2012, 10/29/2013, 11/22/2014, 11/13/2015  . Influenza-Unspecified 10/29/2013, 10/28/2016  . Pneumococcal Conjugate-13 05/28/2017  . Pneumococcal Polysaccharide-23 06/03/2018  . Tdap 04/03/2010    Qualifies for Shingles Vaccine? Yes  Zostavax completed approx 2017. Due for Shingrix. Education has been provided regarding the importance of this vaccine. Pt has been advised to call insurance company to determine out of pocket expense. Advised may also receive vaccine at local pharmacy or Health Dept. Verbalized acceptance and understanding.  Tdap: Up to date  Flu Vaccine: Up to date  Pneumococcal Vaccine: Due for Pneumococcal vaccine. Does the patient want to receive this vaccine today?  Yes . Education has been provided regarding the importance of this vaccine  but still declined. Advised may receive this vaccine at local pharmacy or Health Dept. Aware to provide a copy of the vaccination record if  obtained from local pharmacy or Health Dept. Verbalized acceptance and understanding.   Screening Tests Health Maintenance  Topic Date Due  . INFLUENZA VACCINE  08/29/2018  . TETANUS/TDAP  04/02/2020  . COLONOSCOPY  08/23/2022  . PNA vac Low Risk Adult  Completed  . Hepatitis C Screening  Discontinued   Cancer Screenings:  Colorectal Screening: Completed 08/22/17. Repeat every 5 years.  Lung Cancer Screening: (Low Dose CT Chest recommended if Age 25-80 years, 30 pack-year currently smoking OR have quit w/in 15years.) does not qualify.   Additional Screening:  Hepatitis C Screening: does qualify; postponed  Vision Screening: Recommended annual ophthalmology exams for early detection of glaucoma and other disorders of the eye. Is the patient up to date with their annual eye exam?  Yes  Who is the provider or what is the name of the office in which the pt attends annual eye exams? Dr. Mallie Mussel  Dental Screening: Recommended annual dental exams for proper oral hygiene  Community Resource Referral:  CRR required this visit?  No       Plan:     I have personally reviewed and addressed the Medicare Annual Wellness questionnaire and have noted the following in the patient's chart:  A. Medical and social history B. Use of alcohol, tobacco or illicit drugs  C. Current medications and supplements D. Functional ability and status E.  Nutritional status F.  Physical activity G. Advance directives H. List of other physicians I.  Hospitalizations, surgeries, and ER visits in previous 12 months J.  [place] such as hearing and vision if needed, cognitive and depression L. Referrals and appointments   In addition, I have reviewed and discussed with patient certain preventive protocols, quality metrics, and best practice recommendations. A written personalized care plan for preventive services as well as general preventive health recommendations were provided to patient.    Signed,  Clemetine Marker, LPN Nurse Health Advisor   Nurse Notes: pt doing well and appreciative of visit today

## 2018-07-01 DIAGNOSIS — M79675 Pain in left toe(s): Secondary | ICD-10-CM | POA: Diagnosis not present

## 2018-07-01 DIAGNOSIS — B351 Tinea unguium: Secondary | ICD-10-CM | POA: Diagnosis not present

## 2018-08-09 ENCOUNTER — Other Ambulatory Visit: Payer: Self-pay | Admitting: Family Medicine

## 2018-08-09 DIAGNOSIS — G43909 Migraine, unspecified, not intractable, without status migrainosus: Secondary | ICD-10-CM

## 2018-08-09 DIAGNOSIS — I10 Essential (primary) hypertension: Secondary | ICD-10-CM

## 2018-08-11 ENCOUNTER — Other Ambulatory Visit: Payer: Self-pay

## 2018-08-11 DIAGNOSIS — I10 Essential (primary) hypertension: Secondary | ICD-10-CM

## 2018-08-11 MED ORDER — LISINOPRIL 20 MG PO TABS: 20.0000 mg | ORAL_TABLET | Freq: Every day | ORAL | 0 refills | Status: DC

## 2018-08-11 MED ORDER — AMLODIPINE BESYLATE 2.5 MG PO TABS: 2.5000 mg | ORAL_TABLET | Freq: Every day | ORAL | 0 refills | Status: DC

## 2018-08-14 DIAGNOSIS — Z510 Encounter for antineoplastic radiation therapy: Secondary | ICD-10-CM | POA: Diagnosis not present

## 2018-08-14 DIAGNOSIS — C61 Malignant neoplasm of prostate: Secondary | ICD-10-CM | POA: Diagnosis not present

## 2018-08-14 DIAGNOSIS — R972 Elevated prostate specific antigen [PSA]: Secondary | ICD-10-CM | POA: Diagnosis not present

## 2018-09-04 ENCOUNTER — Other Ambulatory Visit: Payer: Self-pay

## 2018-09-04 ENCOUNTER — Encounter: Payer: Self-pay | Admitting: Family Medicine

## 2018-09-04 ENCOUNTER — Ambulatory Visit (INDEPENDENT_AMBULATORY_CARE_PROVIDER_SITE_OTHER): Payer: Medicare Other | Admitting: Family Medicine

## 2018-09-04 DIAGNOSIS — M25559 Pain in unspecified hip: Secondary | ICD-10-CM | POA: Diagnosis not present

## 2018-09-04 DIAGNOSIS — I10 Essential (primary) hypertension: Secondary | ICD-10-CM | POA: Diagnosis not present

## 2018-09-04 DIAGNOSIS — G43909 Migraine, unspecified, not intractable, without status migrainosus: Secondary | ICD-10-CM

## 2018-09-04 MED ORDER — LISINOPRIL 20 MG PO TABS: 20.0000 mg | ORAL_TABLET | Freq: Every day | ORAL | 1 refills | Status: DC

## 2018-09-04 MED ORDER — AMLODIPINE BESYLATE 2.5 MG PO TABS: 2.5000 mg | ORAL_TABLET | Freq: Every day | ORAL | 1 refills | Status: DC

## 2018-09-04 MED ORDER — PROPRANOLOL HCL ER 80 MG PO CP24: 80.0000 mg | ORAL_CAPSULE | Freq: Every day | ORAL | 1 refills | Status: DC

## 2018-09-04 MED ORDER — ETODOLAC 500 MG PO TABS: ORAL_TABLET | ORAL | 1 refills | Status: DC

## 2018-09-04 NOTE — Progress Notes (Signed)
Date:  09/04/2018   Name:  Marcus Gonzalez   DOB:  Jun 14, 1950   MRN:  [id number]   Chief Complaint: Hypertension, Headache, and arthritis pain (hip pain)  Hypertension This is a chronic problem. The current episode started more than 1 year ago. The problem has been waxing and waning since onset. The problem is controlled. Associated symptoms include headaches. Pertinent negatives include no anxiety, blurred vision, chest pain, malaise/fatigue, neck pain, orthopnea, palpitations, peripheral edema, PND, shortness of breath or sweats. There are no associated agents to hypertension. There are no known risk factors for coronary artery disease. Past treatments include calcium channel blockers, beta blockers and ACE inhibitors. The current treatment provides moderate improvement. There are no compliance problems.  There is no history of angina, kidney disease, CAD/MI, CVA, heart failure, left ventricular hypertrophy, PVD or retinopathy. There is no history of chronic renal disease, a hypertension causing med or renovascular disease.  Headache  This is a chronic problem. The current episode started more than 1 year ago. The problem occurs intermittently. The problem has been unchanged (stable). The pain does not radiate. The quality of the pain is described as aching. The pain is moderate. Pertinent negatives include no abdominal pain, abnormal behavior, anorexia, back pain, blurred vision, coughing, dizziness, drainage, ear pain, facial sweating, fever, hearing loss, insomnia, muscle aches, nausea, neck pain, rhinorrhea, scalp tenderness, seizures, sinus pressure or sore throat. Nothing aggravates the symptoms. His past medical history is significant for hypertension.    Review of Systems  Constitutional: Negative for chills, fever and malaise/fatigue.  HENT: Negative for drooling, ear discharge, ear pain, hearing loss, mouth sores, nosebleeds, postnasal drip, rhinorrhea, sinus pressure and sore throat.    Eyes: Negative for blurred vision.  Respiratory: Negative for cough, shortness of breath and wheezing.   Cardiovascular: Negative for chest pain, palpitations, orthopnea, leg swelling and PND.  Gastrointestinal: Negative for abdominal pain, anorexia, blood in stool, constipation, diarrhea and nausea.  Endocrine: Negative for polydipsia.  Genitourinary: Negative for dysuria, frequency, hematuria and urgency.  Musculoskeletal: Negative for back pain, myalgias and neck pain.  Skin: Negative for rash.  Allergic/Immunologic: Negative for environmental allergies.  Neurological: Positive for headaches. Negative for dizziness and seizures.  Hematological: Does not bruise/bleed easily.  Psychiatric/Behavioral: Negative for suicidal ideas. The patient is not nervous/anxious and does not have insomnia.     Patient Active Problem List   Diagnosis Date Noted  . Arthralgia of hip 06/03/2018  . 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 Encounter for screening colonoscopy   . Benign neoplasm of ascending colon   . Polyp of sigmoid colon   . Rectal polyp   . Hyperlipidemia 11/29/2016  . Degeneration, intervertebral disc, cervical 04/19/2016  . Migraine without status migrainosus, not intractable 04/19/2016  . Essential hypertension 04/19/2016  . Cephalalgia 05/24/2014  . Routine general medical examination at a health care facility 05/24/2014  . Anxiety attack 05/24/2014  . Arthritis 05/24/2014  . Benign prostatic hyperplasia without lower urinary tract symptoms 05/24/2014  . Narrowing of intervertebral disc space 05/24/2014  . History of migraine headaches 05/24/2014    Allergies  Allergen Reactions  . Cephalexin     Past Surgical History:  Procedure Laterality Date  . BASAL CELL CARCINOMA EXCISION     arm and leg  . COLONOSCOPY  2013   cleared for 5 yrs- Dr Allen Norris  . COLONOSCOPY WITH PROPOFOL N/A 08/22/2017   Procedure: COLONOSCOPY WITH PROPOFOL;  Surgeon: Lucilla Lame, MD;  Location[facility];  Service: Endoscopy;  Laterality: N/A;  . POLYPECTOMY  08/22/2017   Procedure: POLYPECTOMY INTESTINAL;  Surgeon: Lucilla Lame, MD;  Location: [place];  Service: Endoscopy;;  . skin ca removed      Social History   Tobacco Use  . Smoking status: Former Smoker    Packs/day: 1.00    Years: 29.00    Pack years: 29.00    Types: Cigarettes    Quit date: 1999    Years since quitting: 21.6  . Smokeless tobacco: Never Used  . Tobacco comment: smoking cessation materials not required  Substance Use Topics  . Alcohol use: Yes    Alcohol/week: 14.0 standard drinks    Types: 14 Glasses of wine per week  . Drug use: No     Medication list has been reviewed and updated.  Current Meds  Medication Sig  . amLODipine (NORVASC) 2.5 MG tablet Take 1 tablet (2.5 mg total) by mouth daily.  Marland Kitchen Apple Cider Vinegar 188 MG CAPS Take by mouth.  . Ascorbic Acid (VITAMIN C) 100 MG tablet Take by mouth.  . B Complex Vitamins (VITAMIN B-COMPLEX) TABS Take by mouth.  Raelyn Ensign Pollen 1000 MG TABS Take by mouth.  . Calcium Citrate 1040 MG TABS Take by mouth.  . Cinnamon 500 MG capsule Take by mouth.  . Cranberry 200 MG CAPS Take by mouth.  . cyclobenzaprine (FLEXERIL) 10 MG tablet Take 1 tablet (10 mg total) by mouth 3 (three) times daily as needed. for muscle spams  . etodolac (LODINE) 500 MG tablet One twice a day  . Flaxseed, Linseed, (FLAXSEED OIL PO) Take by mouth.  . Glucosamine-Chondroitin (GLUCOSAMINE CHONDR COMPLEX PO) Take by mouth.  Marland Kitchen lisinopril (ZESTRIL) 20 MG tablet Take 1 tablet (20 mg total) by mouth daily.  . Multiple Vitamins-Minerals (MULTIVITAMIN ADULT PO) Take by mouth.  . propranolol ER (INDERAL LA) 80 MG 24 hr capsule TAKE 1 CAPSULE BY MOUTH DAILY    PHQ 2/9 Scores 09/04/2018 06/03/2018 12/05/2017 05/28/2017  PHQ - 2 Score 0 0 0 0  PHQ- 9 Score 0 - 0 0    BP Readings from Last 3 Encounters:  09/04/18 138/76  06/03/18 112/70  12/05/17 120/80    Physical Exam  Vitals signs and nursing note reviewed.  HENT:     Head: Normocephalic.     Right Ear: External ear normal.     Left Ear: External ear normal.     Nose: Nose normal.  Eyes:     General: No scleral icterus.       Right eye: No discharge.        Left eye: No discharge.     Extraocular Movements: Extraocular movements intact.     Right eye: Normal extraocular motion.     Left eye: Normal extraocular motion.     Conjunctiva/sclera: Conjunctivae normal.     Pupils: Pupils are equal, round, and reactive to light.  Neck:     Musculoskeletal: Normal range of motion and neck supple.     Thyroid: No thyromegaly.     Vascular: No JVD.     Trachea: No tracheal deviation.  Cardiovascular:     Rate and Rhythm: Normal rate and regular rhythm.     Heart sounds: Normal heart sounds. No murmur. No friction rub. No gallop.   Pulmonary:     Effort: No respiratory distress.     Breath sounds: Normal breath sounds. No wheezing, rhonchi or rales.  Abdominal:  General: Bowel sounds are normal.     Palpations: Abdomen is soft. There is no mass.     Tenderness: There is no abdominal tenderness. There is no guarding or rebound.  Musculoskeletal: Normal range of motion.        General: No swelling or tenderness.  Lymphadenopathy:     Cervical: No cervical adenopathy.  Skin:    General: Skin is warm.     Capillary Refill: Capillary refill takes less than 2 seconds.     Findings: No rash.  Neurological:     Mental Status: He is alert and oriented to person, place, and time.     Cranial Nerves: No cranial nerve deficit.     Deep Tendon Reflexes: Reflexes are normal and symmetric. Reflexes normal.     Wt Readings from Last 3 Encounters:  09/04/18 170 lb (77.1 kg)  06/03/18 165 lb (74.8 kg)  12/05/17 165 lb (74.8 kg)    BP 138/76   Pulse 73   Ht 5\' 9"  (1.753 m)   Wt 170 lb (77.1 kg)   SpO2 97%   BMI 25.10 kg/m   Assessment and Plan: 1. Essential hypertension Chronic.  Controlled.   Continue amlodipine 2.51 tablet daily.  And lisinopril 20 mg 1 tablet daily.  And propranolol ER 80 mg 1 capsule daily. - amLODipine (NORVASC) 2.5 MG tablet; Take 1 tablet (2.5 mg total) by mouth daily.  Dispense: 90 tablet; Refill: 1 - lisinopril (ZESTRIL) 20 MG tablet; Take 1 tablet (20 mg total) by mouth daily.  Dispense: 90 tablet; Refill: 1 - propranolol ER (INDERAL LA) 80 MG 24 hr capsule; Take 1 capsule (80 mg total) by mouth daily.  Dispense: 90 capsule; Refill: 1  2. Arthralgia of hip, unspecified laterality Patient with history of arthralgia of hip needing periodic NSAIDs.  Will continue etodolac 500 mg twice a day as needed - etodolac (LODINE) 500 MG tablet; One twice a day  Dispense: 180 tablet; Refill: 1  3. Migraine without status migrainosus, not intractable, unspecified migraine type Patient on prophylaxis for migraines.  Will continue propranolol ER 80 mg daily. - propranolol ER (INDERAL LA) 80 MG 24 hr capsule; Take 1 capsule (80 mg total) by mouth daily.  Dispense: 90 capsule; Refill: 1

## 2018-09-28 DIAGNOSIS — Z08 Encounter for follow-up examination after completed treatment for malignant neoplasm: Secondary | ICD-10-CM | POA: Diagnosis not present

## 2018-09-28 DIAGNOSIS — D2261 Melanocytic nevi of right upper limb, including shoulder: Secondary | ICD-10-CM | POA: Diagnosis not present

## 2018-09-28 DIAGNOSIS — D225 Melanocytic nevi of trunk: Secondary | ICD-10-CM | POA: Diagnosis not present

## 2018-09-28 DIAGNOSIS — Z85828 Personal history of other malignant neoplasm of skin: Secondary | ICD-10-CM | POA: Diagnosis not present

## 2018-09-28 DIAGNOSIS — D2262 Melanocytic nevi of left upper limb, including shoulder: Secondary | ICD-10-CM | POA: Diagnosis not present

## 2018-09-28 DIAGNOSIS — D2271 Melanocytic nevi of right lower limb, including hip: Secondary | ICD-10-CM | POA: Diagnosis not present

## 2018-09-28 DIAGNOSIS — L570 Actinic keratosis: Secondary | ICD-10-CM | POA: Diagnosis not present

## 2018-09-28 DIAGNOSIS — D2272 Melanocytic nevi of left lower limb, including hip: Secondary | ICD-10-CM | POA: Diagnosis not present

## 2018-11-01 ENCOUNTER — Other Ambulatory Visit: Payer: Self-pay | Admitting: Family Medicine

## 2018-11-01 DIAGNOSIS — I10 Essential (primary) hypertension: Secondary | ICD-10-CM

## 2018-11-05 ENCOUNTER — Ambulatory Visit (INDEPENDENT_AMBULATORY_CARE_PROVIDER_SITE_OTHER): Payer: Medicare Other

## 2018-11-05 ENCOUNTER — Other Ambulatory Visit: Payer: Self-pay

## 2018-11-05 DIAGNOSIS — Z23 Encounter for immunization: Secondary | ICD-10-CM

## 2018-12-15 ENCOUNTER — Other Ambulatory Visit: Payer: Self-pay

## 2018-12-15 ENCOUNTER — Encounter: Payer: Self-pay | Admitting: Family Medicine

## 2018-12-15 ENCOUNTER — Ambulatory Visit (INDEPENDENT_AMBULATORY_CARE_PROVIDER_SITE_OTHER): Payer: Medicare Other | Admitting: Family Medicine

## 2018-12-15 VITALS — BP 120/78 | HR 72 | Ht 69.0 in | Wt 172.0 lb

## 2018-12-15 DIAGNOSIS — G43909 Migraine, unspecified, not intractable, without status migrainosus: Secondary | ICD-10-CM | POA: Diagnosis not present

## 2018-12-15 DIAGNOSIS — I10 Essential (primary) hypertension: Secondary | ICD-10-CM

## 2018-12-15 DIAGNOSIS — M25559 Pain in unspecified hip: Secondary | ICD-10-CM

## 2018-12-15 DIAGNOSIS — M5030 Other cervical disc degeneration, unspecified cervical region: Secondary | ICD-10-CM

## 2018-12-15 DIAGNOSIS — E663 Overweight: Secondary | ICD-10-CM

## 2018-12-15 MED ORDER — BUTALBITAL-ASPIRIN-CAFFEINE 50-325-40 MG PO CAPS: 1.0000 | ORAL_CAPSULE | Freq: Two times a day (BID) | ORAL | 0 refills | Status: DC | PRN

## 2018-12-15 MED ORDER — PROPRANOLOL HCL ER 80 MG PO CP24: 80.0000 mg | ORAL_CAPSULE | Freq: Every day | ORAL | 1 refills | Status: DC

## 2018-12-15 MED ORDER — LISINOPRIL 20 MG PO TABS: 20.0000 mg | ORAL_TABLET | Freq: Every day | ORAL | 1 refills | Status: DC

## 2018-12-15 MED ORDER — ETODOLAC 500 MG PO TABS: ORAL_TABLET | ORAL | 1 refills | Status: DC

## 2018-12-15 MED ORDER — CYCLOBENZAPRINE HCL 10 MG PO TABS: 10.0000 mg | ORAL_TABLET | Freq: Three times a day (TID) | ORAL | 1 refills | Status: DC | PRN

## 2018-12-15 MED ORDER — AMLODIPINE BESYLATE 2.5 MG PO TABS: 2.5000 mg | ORAL_TABLET | Freq: Every day | ORAL | 1 refills | Status: DC

## 2018-12-15 NOTE — Progress Notes (Signed)
Date:  12/15/2018   Name:  Marcus Gonzalez   DOB:  [birth year]   MRN:  [id number]   Chief Complaint: Hypertension, Headache (takes for headaches), and Back Pain  Hypertension This is a chronic problem. The current episode started more than 1 year ago. The problem has been waxing and waning since onset. The problem is controlled. Associated symptoms include headaches. Pertinent negatives include no anxiety, blurred vision, chest pain, malaise/fatigue, neck pain, orthopnea, palpitations, peripheral edema, PND, shortness of breath or sweats. There are no associated agents to hypertension. Risk factors for coronary artery disease include male gender. Past treatments include nothing. The current treatment provides moderate improvement. There are no compliance problems.  There is no history of angina, kidney disease, CAD/MI, CVA, heart failure, left ventricular hypertrophy, PVD or retinopathy. There is no history of chronic renal disease, a hypertension causing med or renovascular disease.  Headache  This is a chronic problem. The current episode started more than 1 year ago. The problem occurs intermittently. The problem has been unchanged. The pain does not radiate. The pain quality is similar to prior headaches. The pain is moderate. Associated symptoms include drainage and sinus pressure. Pertinent negatives include no abnormal behavior, anorexia, blurred vision, dizziness, eye watering, facial sweating, fever, hearing loss, loss of balance, muscle aches, neck pain, numbness, phonophobia, photophobia, rhinorrhea, scalp tenderness, seizures, tingling, visual change, weakness or weight loss. Treatments tried: propranolol prophyllaxis. The treatment provided moderate relief. His past medical history is significant for hypertension. There is no history of cancer, cluster headaches, immunosuppression, migraine headaches, migraines in the family, obesity, pseudotumor cerebri, recent head traumas, sinus disease  or TMJ.  Neck Pain  This is a chronic problem. The current episode started more than 1 year ago. The problem has been waxing and waning. The pain is associated with nothing. The quality of the pain is described as aching. The pain is mild. Nothing aggravates the symptoms. Associated symptoms include headaches. Pertinent negatives include no chest pain, fever, leg pain, numbness, pain with swallowing, paresis, photophobia, syncope, tingling, trouble swallowing, visual change, weakness or weight loss. He has tried muscle relaxants for the symptoms. The treatment provided moderate relief.    Lab Results  Component Value Date   CREATININE 1.08 11/29/2016   BUN 12 11/29/2016   NA 141 11/29/2016   K 4.7 11/29/2016   CL 100 11/29/2016   CO2 25 11/29/2016   Lab Results  Component Value Date   CHOL 170 11/29/2016   HDL 73 11/29/2016   LDLCALC 84 11/29/2016   TRIG 63 11/29/2016   CHOLHDL 2.3 11/29/2016   No results found for: TSH No results found for: HGBA1C   Review of Systems  Constitutional: Negative for fever, malaise/fatigue and weight loss.  HENT: Positive for sinus pressure. Negative for hearing loss, rhinorrhea and trouble swallowing.   Eyes: Negative for blurred vision and photophobia.  Respiratory: Negative for shortness of breath.   Cardiovascular: Negative for chest pain, palpitations, orthopnea, syncope and PND.  Gastrointestinal: Negative for anorexia.  Musculoskeletal: Negative for neck pain.  Neurological: Positive for headaches. Negative for dizziness, tingling, seizures, weakness, numbness and loss of balance.    Patient Active Problem List   Diagnosis Date Noted  . Arthralgia of hip 06/03/2018  . 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 Encounter for screening colonoscopy   . Benign neoplasm of ascending colon   . Polyp of sigmoid colon   . Rectal polyp   . Hyperlipidemia 11/29/2016  . Degeneration,  intervertebral disc, cervical 04/19/2016  . Migraine without status  migrainosus, not intractable 04/19/2016  . Essential hypertension 04/19/2016  . Cephalalgia 05/24/2014  . Routine general medical examination at a health care facility 05/24/2014  . Anxiety attack 05/24/2014  . Arthritis 05/24/2014  . Benign prostatic hyperplasia without lower urinary tract symptoms 05/24/2014  . Narrowing of intervertebral disc space 05/24/2014  . History of migraine headaches 05/24/2014    Allergies  Allergen Reactions  . Cephalexin     Past Surgical History:  Procedure Laterality Date  . BASAL CELL CARCINOMA EXCISION     arm and leg  . COLONOSCOPY  2013   cleared for 5 yrs- Dr Allen Norris  . COLONOSCOPY WITH PROPOFOL N/A 08/22/2017   Procedure: COLONOSCOPY WITH PROPOFOL;  Surgeon: Lucilla Lame, MD;  Location: [facility];  Service: Endoscopy;  Laterality: N/A;  . POLYPECTOMY  08/22/2017   Procedure: POLYPECTOMY INTESTINAL;  Surgeon: Lucilla Lame, MD;  Location: [place] [facility];  Service: Endoscopy;;  . skin ca removed      Social History   Tobacco Use  . Smoking status: Former Smoker    Packs/day: 1.00    Years: 29.00    Pack years: 29.00    Types: Cigarettes    Quit date: 1999    Years since quitting: 21.8  . Smokeless tobacco: Never Used  . Tobacco comment: smoking cessation materials not required  Substance Use Topics  . Alcohol use: Yes    Alcohol/week: 14.0 standard drinks    Types: 14 Glasses of wine per week  . Drug use: No     Medication list has been reviewed and updated.  Current Meds  Medication Sig  . amLODipine (NORVASC) 2.5 MG tablet TAKE 1 TABLET BY MOUTH DAILY  . Apple Cider Vinegar 188 MG CAPS Take by mouth.  . Ascorbic Acid (VITAMIN C) 100 MG tablet Take by mouth.  . B Complex Vitamins (VITAMIN B-COMPLEX) TABS Take by mouth.  Raelyn Ensign Pollen 1000 MG TABS Take by mouth.  . Calcium Citrate 1040 MG TABS Take by mouth.  . Cinnamon 500 MG capsule Take by mouth.  . Cranberry 200 MG CAPS Take by mouth.  . cyclobenzaprine  (FLEXERIL) 10 MG tablet Take 1 tablet (10 mg total) by mouth 3 (three) times daily as needed. for muscle spams  . etodolac (LODINE) 500 MG tablet One twice a day  . Flaxseed, Linseed, (FLAXSEED OIL PO) Take by mouth.  . Glucosamine-Chondroitin (GLUCOSAMINE CHONDR COMPLEX PO) Take by mouth.  Marland Kitchen lisinopril (ZESTRIL) 20 MG tablet TAKE 1 TABLET BY MOUTH DAILY  . Multiple Vitamins-Minerals (MULTIVITAMIN ADULT PO) Take by mouth.  . propranolol ER (INDERAL LA) 80 MG 24 hr capsule Take 1 capsule (80 mg total) by mouth daily.    PHQ 2/9 Scores 12/15/2018 09/04/2018 06/03/2018 12/05/2017  PHQ - 2 Score 0 0 0 0  PHQ- 9 Score 0 0 - 0    BP Readings from Last 3 Encounters:  12/15/18 120/78  09/04/18 138/76  06/03/18 112/70    Physical Exam Vitals signs and nursing note reviewed.  HENT:     Head: Normocephalic.     Right Ear: External ear normal.     Left Ear: External ear normal.     Nose: Nose normal.  Eyes:     General: No scleral icterus.       Right eye: No discharge.        Left eye: No discharge.     Conjunctiva/sclera: Conjunctivae normal.  Pupils: Pupils are equal, round, and reactive to light.  Neck:     Musculoskeletal: Normal range of motion and neck supple.     Thyroid: No thyromegaly.     Vascular: No JVD.     Trachea: No tracheal deviation.  Cardiovascular:     Rate and Rhythm: Normal rate and regular rhythm.     Heart sounds: Normal heart sounds. No murmur. No friction rub. No gallop.   Pulmonary:     Effort: No respiratory distress.     Breath sounds: Normal breath sounds. No wheezing or rales.  Abdominal:     General: Bowel sounds are normal.     Palpations: Abdomen is soft. There is no mass.     Tenderness: There is no abdominal tenderness. There is no guarding or rebound.  Musculoskeletal: Normal range of motion.        General: No tenderness.  Lymphadenopathy:     Cervical: No cervical adenopathy.  Skin:    General: Skin is warm.     Findings: No rash.   Neurological:     Mental Status: He is alert and oriented to person, place, and time.     Cranial Nerves: No cranial nerve deficit.     Deep Tendon Reflexes: Reflexes are normal and symmetric.     Wt Readings from Last 3 Encounters:  12/15/18 172 lb (78 kg)  09/04/18 170 lb (77.1 kg)  06/03/18 165 lb (74.8 kg)    BP 120/78   Pulse 72   Ht 5\' 9"  (1.753 m)   Wt 172 lb (78 kg)   BMI 25.40 kg/m   Assessment and Plan: 1. Arthralgia of hip, unspecified laterality Chronic.  Controlled.  Continue etodolac 500 mg 1 twice a day. - etodolac (LODINE) 500 MG tablet; One twice a day  Dispense: 180 tablet; Refill: 1  2. Degeneration, intervertebral disc, cervical Chronic neck pain which is relaxed by cyclobenzaprine as needed.  Patient primarily takes it in the evening. - cyclobenzaprine (FLEXERIL) 10 MG tablet; Take 1 tablet (10 mg total) by mouth 3 (three) times daily as needed. for muscle spams  Dispense: 90 tablet; Refill: 1  3. Essential hypertension Chronic.  Controlled.  Continue amlodipine 2.5 mg once a day and lisinopril 20 mg once a day.  Will check renal function panel.  Will recheck in 6 months. - Renal function panel - amLODipine (NORVASC) 2.5 MG tablet; Take 1 tablet (2.5 mg total) by mouth daily.  Dispense: 90 tablet; Refill: 1 - lisinopril (ZESTRIL) 20 MG tablet; Take 1 tablet (20 mg total) by mouth daily.  Dispense: 90 tablet; Refill: 1 - propranolol ER (INDERAL LA) 80 MG 24 hr capsule; Take 1 capsule (80 mg total) by mouth daily.  Dispense: 90 capsule; Refill: 1  4. Migraine without status migrainosus, not intractable, unspecified migraine type Chronic.  Controlled.  Prophylactically is controlled by propranolol ER 80 mg once a day.  We will also refill the butalbital to take on an as-needed basis. - butalbital-aspirin-caffeine (FIORINAL) 50-325-40 MG capsule; Take 1 capsule by mouth 2 (two) times daily as needed for headache.  Dispense: 14 capsule; Refill: 0 - propranolol  ER (INDERAL LA) 80 MG 24 hr capsule; Take 1 capsule (80 mg total) by mouth daily.  Dispense: 90 capsule; Refill: 1  5. Overweight (BMI 25.0-29.9) We will check lipid panel.Health risks of being over weight were discussed and patient was counseled on weight loss options and exercise.  - Lipid Panel With LDL/HDL Ratio Mediterranean diet  will be sent.

## 2018-12-16 LAB — LIPID PANEL WITH LDL/HDL RATIO
Cholesterol, Total: 168 mg/dL (ref 100–199)
HDL: 70 mg/dL (ref >39)
LDL Chol Calc (NIH): 86 mg/dL (ref 0–99)
LDL/HDL Ratio: 1.2 ratio (ref 0.0–3.6)
Triglycerides: 61 mg/dL (ref 0–149)
VLDL Cholesterol Cal: 12 mg/dL (ref 5–40)

## 2018-12-16 LAB — RENAL FUNCTION PANEL
Albumin: 4.5 g/dL (ref 3.8–4.8)
BUN/Creatinine Ratio: 18 (ref 10–24)
BUN: 19 mg/dL (ref 8–27)
CO2: 24 mmol/L (ref 20–29)
Calcium: 9.6 mg/dL (ref 8.6–10.2)
Chloride: 101 mmol/L (ref 96–106)
Creatinine, Ser: 1.05 mg/dL (ref 0.76–1.27)
GFR calc Af Amer: 83 mL/min/{1.73_m2} (ref >59)
GFR calc non Af Amer: 72 mL/min/{1.73_m2} (ref >59)
Glucose: 87 mg/dL (ref 65–99)
Phosphorus: 3.2 mg/dL (ref 2.8–4.1)
Potassium: 5 mmol/L (ref 3.5–5.2)
Sodium: 141 mmol/L (ref 134–144)

## 2019-02-08 DIAGNOSIS — C61 Malignant neoplasm of prostate: Secondary | ICD-10-CM | POA: Diagnosis not present

## 2019-02-08 DIAGNOSIS — Z510 Encounter for antineoplastic radiation therapy: Secondary | ICD-10-CM | POA: Diagnosis not present

## 2019-02-22 DIAGNOSIS — Z23 Encounter for immunization: Secondary | ICD-10-CM | POA: Diagnosis not present

## 2019-03-15 DIAGNOSIS — Z23 Encounter for immunization: Secondary | ICD-10-CM | POA: Diagnosis not present

## 2019-03-30 DIAGNOSIS — L821 Other seborrheic keratosis: Secondary | ICD-10-CM | POA: Diagnosis not present

## 2019-03-30 DIAGNOSIS — Z85828 Personal history of other malignant neoplasm of skin: Secondary | ICD-10-CM | POA: Diagnosis not present

## 2019-03-30 DIAGNOSIS — D485 Neoplasm of uncertain behavior of skin: Secondary | ICD-10-CM | POA: Diagnosis not present

## 2019-03-30 DIAGNOSIS — D2262 Melanocytic nevi of left upper limb, including shoulder: Secondary | ICD-10-CM | POA: Diagnosis not present

## 2019-03-30 DIAGNOSIS — C44519 Basal cell carcinoma of skin of other part of trunk: Secondary | ICD-10-CM | POA: Diagnosis not present

## 2019-03-30 DIAGNOSIS — L570 Actinic keratosis: Secondary | ICD-10-CM | POA: Diagnosis not present

## 2019-03-30 DIAGNOSIS — C44712 Basal cell carcinoma of skin of right lower limb, including hip: Secondary | ICD-10-CM | POA: Diagnosis not present

## 2019-03-30 DIAGNOSIS — D2272 Melanocytic nevi of left lower limb, including hip: Secondary | ICD-10-CM | POA: Diagnosis not present

## 2019-03-30 DIAGNOSIS — D2261 Melanocytic nevi of right upper limb, including shoulder: Secondary | ICD-10-CM | POA: Diagnosis not present

## 2019-03-30 DIAGNOSIS — X32XXXA Exposure to sunlight, initial encounter: Secondary | ICD-10-CM | POA: Diagnosis not present

## 2019-05-11 DIAGNOSIS — C44519 Basal cell carcinoma of skin of other part of trunk: Secondary | ICD-10-CM | POA: Diagnosis not present

## 2019-05-11 DIAGNOSIS — C44712 Basal cell carcinoma of skin of right lower limb, including hip: Secondary | ICD-10-CM | POA: Diagnosis not present

## 2019-06-09 ENCOUNTER — Ambulatory Visit (INDEPENDENT_AMBULATORY_CARE_PROVIDER_SITE_OTHER): Payer: Medicare Other

## 2019-06-09 ENCOUNTER — Other Ambulatory Visit: Payer: Self-pay

## 2019-06-09 VITALS — BP 118/80 | HR 66 | Temp 95.2°F | Resp 16 | Ht 69.0 in | Wt 178.8 lb

## 2019-06-09 DIAGNOSIS — Z0000 Encounter for general adult medical examination without abnormal findings: Secondary | ICD-10-CM

## 2019-06-09 DIAGNOSIS — M5030 Other cervical disc degeneration, unspecified cervical region: Secondary | ICD-10-CM

## 2019-06-09 DIAGNOSIS — G43909 Migraine, unspecified, not intractable, without status migrainosus: Secondary | ICD-10-CM

## 2019-06-09 DIAGNOSIS — I10 Essential (primary) hypertension: Secondary | ICD-10-CM

## 2019-06-09 MED ORDER — PROPRANOLOL HCL ER 80 MG PO CP24: 80.0000 mg | ORAL_CAPSULE | Freq: Every day | ORAL | 0 refills | Status: DC

## 2019-06-09 MED ORDER — LISINOPRIL 20 MG PO TABS: 20.0000 mg | ORAL_TABLET | Freq: Every day | ORAL | 0 refills | Status: DC

## 2019-06-09 MED ORDER — AMLODIPINE BESYLATE 2.5 MG PO TABS: 2.5000 mg | ORAL_TABLET | Freq: Every day | ORAL | 0 refills | Status: DC

## 2019-06-09 MED ORDER — CYCLOBENZAPRINE HCL 10 MG PO TABS: 10.0000 mg | ORAL_TABLET | Freq: Three times a day (TID) | ORAL | 0 refills | Status: DC | PRN

## 2019-06-09 NOTE — Progress Notes (Unsigned)
Sent in lisinopril, Amlodipine, Cyclobenzaprine and Propranolol

## 2019-06-09 NOTE — Progress Notes (Signed)
Subjective:   Marcus Gonzalez is a 69 y.o. male who presents for Medicare Annual/Subsequent preventive examination.  Review of Systems:   Cardiac Risk Factors include: advanced age (>58men, >35 women);hypertension;male gender     Objective:    Vitals: BP 118/80 (BP Location: Left Arm, Patient Position: Sitting, Cuff Size: Normal)   Pulse 66   Temp (!) 95.2 F (35.1 C) (Temporal)   Resp 16   Ht 5\' 9"  (1.753 m)   Wt 178 lb 12.8 oz (81.1 kg)   SpO2 96%   BMI 26.40 kg/m   Body mass index is 26.4 kg/m.  Advanced Directives 06/03/2018 12/05/2017 08/22/2017 05/28/2017 10/07/2014  Does Patient Have a Medical Advance Directive? Yes Yes Yes Yes Yes  Type of Advance Directive Living will;Healthcare Power of [place];Living will [place];Living will [place];Living will [place];Living will  Does patient want to make changes to medical advance directive? - No - Patient declined No - Patient declined - -  Copy of [place] in Chart? No - copy requested No - copy requested No - copy requested No - copy requested No - copy requested    Tobacco Social History   Tobacco Use  Smoking Status Former Smoker  . Packs/day: 1.00  . Years: 29.00  . Pack years: 29.00  . Types: Cigarettes  . Quit date: 1999  . Years since quitting: 22.3  Smokeless Tobacco Never Used  Tobacco Comment   smoking cessation materials not required     Counseling given: Not Answered Comment: smoking cessation materials not required   Clinical Intake:  Pre-visit preparation completed: Yes  Pain : No/denies pain     BMI - recorded: 26.4 Nutritional Status: BMI 25 -29 Overweight Nutritional Risks: None Diabetes: No  How often do you need to have someone help you when you read instructions, pamphlets, or other written materials from your doctor or pharmacy?: 1 - Never  Interpreter Needed?:  No  Information entered by :: Clemetine Marker LPN  Past Medical History:  Diagnosis Date  . BPH (benign prostatic hyperplasia)   . Degenerative disc disease, cervical   . Headache   . Hip pain   . Migraine   . Prostate cancer [facility])    Past Surgical History:  Procedure Laterality Date  . BASAL CELL CARCINOMA EXCISION     arm and leg  . COLONOSCOPY  2013   cleared for 5 yrs- Dr Allen Norris  . COLONOSCOPY WITH PROPOFOL N/A 08/22/2017   Procedure: COLONOSCOPY WITH PROPOFOL;  Surgeon: Lucilla Lame, MD;  Location: [place];  Service: Endoscopy;  Laterality: N/A;  . POLYPECTOMY  08/22/2017   Procedure: POLYPECTOMY INTESTINAL;  Surgeon: Lucilla Lame, MD;  Location: [place];  Service: Endoscopy;;  . skin ca removed     Family History  Problem Relation Age of Onset  . Heart disease Father   . Diabetes Maternal Aunt   . Cancer Maternal Grandmother        colon cancer  . Heart disease Mother   . Dementia Mother   . Other Brother        "bladder issues"   Social History   Socioeconomic History  . Marital status: Single    Spouse name: Not on file  . Number of children: 0  . Years of education: Not on file  . Highest education level: Doctorate  Occupational History    Employer: [employer]   Tobacco  Use  . Smoking status: Former Smoker    Packs/day: 1.00    Years: 29.00    Pack years: 29.00    Types: Cigarettes    Quit date: 1999    Years since quitting: 22.3  . Smokeless tobacco: Never Used  . Tobacco comment: smoking cessation materials not required  Substance and Sexual Activity  . Alcohol use: Yes    Alcohol/week: 14.0 standard drinks    Types: 14 Glasses of wine per week  . Drug use: No  . Sexual activity: Not Currently  Other Topics Concern  . Not on file  Social History Narrative  . Not on file   Social Determinants of Health   Financial Resource Strain: Low Risk   . Difficulty of Paying Living Expenses: Not hard at all  Food Insecurity: No Food  Insecurity  . Worried About Charity fundraiser in the Last Year: Never true  . Ran Out of Food in the Last Year: Never true  Transportation Needs: No Transportation Needs  . Lack of Transportation (Medical): No  . Lack of Transportation (Non-Medical): No  Physical Activity: Inactive  . Days of Exercise per Week: 0 days  . Minutes of Exercise per Session: 0 min  Stress: No Stress Concern Present  . Feeling of Stress : Not at all  Social Connections: Somewhat Isolated  . Frequency of Communication with Friends and Family: More than three times a week  . Frequency of Social Gatherings with Friends and Family: More than three times a week  . Attends Religious Services: More than 4 times per year  . Active Member of Clubs or Organizations: No  . Attends Archivist Meetings: Never  . Marital Status: Never married    Outpatient Encounter Medications as of 06/09/2019  Medication Sig  . amLODipine (NORVASC) 2.5 MG tablet Take 1 tablet (2.5 mg total) by mouth daily.  Marland Kitchen Apple Cider Vinegar 188 MG CAPS Take by mouth.  . Ascorbic Acid (VITAMIN C) 100 MG tablet Take by mouth.  . B Complex Vitamins (VITAMIN B-COMPLEX) TABS Take by mouth.  Raelyn Ensign Pollen 1000 MG TABS Take by mouth.  . Bilberry 100 MG CAPS Take by mouth.  . Calcium Citrate 1040 MG TABS Take by mouth.  . Cinnamon 500 MG capsule Take by mouth.  . Coenzyme Q10 (COQ-10) 100 MG CAPS Take by mouth.  . Cranberry 200 MG CAPS Take by mouth.  . cyclobenzaprine (FLEXERIL) 10 MG tablet Take 1 tablet (10 mg total) by mouth 3 (three) times daily as needed. for muscle spams  . Echinacea 500 MG CAPS Take by mouth.  . etodolac (LODINE) 500 MG tablet One twice a day  . Flaxseed, Linseed, (FLAXSEED OIL PO) Take by mouth.  . Garlic 123XX123 MG CAPS Take by mouth.  . Glucosamine-Chondroitin (GLUCOSAMINE CHONDR COMPLEX PO) Take by mouth.  Marland Kitchen lisinopril (ZESTRIL) 20 MG tablet Take 1 tablet (20 mg total) by mouth daily.  . Multiple  Vitamins-Minerals (MULTIVITAMIN ADULT PO) Take by mouth.  . Multiple Vitamins-Minerals (MULTIVITAMIN WITH MINERALS) tablet Take 1 tablet by mouth daily.  . propranolol ER (INDERAL LA) 80 MG 24 hr capsule Take 1 capsule (80 mg total) by mouth daily.  Berniece Pap Cartilage 500 MG CAPS Take by mouth.  Marland Kitchen UNABLE TO FIND [employer] toxifier/regenerator herb  . [DISCONTINUED] butalbital-aspirin-caffeine (FIORINAL) 50-325-40 MG capsule Take 1 capsule by mouth 2 (two) times daily as needed for headache.   No facility-administered encounter medications on file as of  06/09/2019.    Activities of Daily Living In your present state of health, do you have any difficulty performing the following activities: 06/09/2019  Hearing? N  Comment declines hearing aids  Vision? N  Difficulty concentrating or making decisions? N  Walking or climbing stairs? N  Dressing or bathing? N  Doing errands, shopping? N  Preparing Food and eating ? N  Using the Toilet? N  In the past six months, have you accidently leaked urine? N  Do you have problems with loss of bowel control? N  Managing your Medications? N  Managing your Finances? N  Housekeeping or managing your Housekeeping? N  Some recent data might be hidden    Patient Care Team: Juline Patch, MD as PCP - General (Family Medicine) Murrell Redden, MD as Consulting Physician (Urology) Dasher, Rayvon Char, MD as Consulting Physician (Dermatology) Tamsen Meek, MD as Consulting Physician (Dermatology)   Assessment:   This is a routine wellness examination for [place].  Exercise Activities and Dietary recommendations Current Exercise Habits: The patient does not participate in regular exercise at present, Exercise limited by: None identified  Goals    . DIET - INCREASE WATER INTAKE     Recommend to drink at least 6-8 8oz glasses of water per day.       Fall Risk Fall Risk  06/09/2019 06/03/2018 12/05/2017 05/28/2017 11/29/2016  Falls in the past year? 0 1  0 No No  Number falls in past yr: 0 1 - - -  Injury with Fall? 0 1 - - -  Comment - broken bone in foot 12/2017 - - -  Risk for fall due to : No Fall Risks - - - -  Follow up Falls prevention discussed Falls prevention discussed Falls evaluation completed - -   FALL RISK PREVENTION PERTAINING TO THE HOME:  Any stairs in or around the home? Yes  If so, do they handrails? Yes   Home free of loose throw rugs in walkways, pet beds, electrical cords, etc? Yes  Adequate lighting in your home to reduce risk of falls? Yes   ASSISTIVE DEVICES UTILIZED TO PREVENT FALLS:  Life alert? No  Use of a cane, walker or w/c? No  Grab bars in the bathroom? No  Shower chair or bench in shower? No  Elevated toilet seat or a handicapped toilet? No   DME ORDERS:  DME order needed?  No   TIMED UP AND GO:  Was the test performed? Yes .  Length of time to ambulate 10 feet: 5 sec.   GAIT:  Appearance of gait: Gait stead-fast and 5 without the use of an assistive device.   Education: Fall risk prevention has been discussed.  Intervention(s) required? No   Depression Screen PHQ 2/9 Scores 06/09/2019 12/15/2018 09/04/2018 06/03/2018  PHQ - 2 Score 0 0 0 0  PHQ- 9 Score - 0 0 -    Cognitive Function 6CIT deferred for 2021 AWV pt has no memory issues.      6CIT Screen 06/03/2018 12/05/2017 05/28/2017  What Year? 0 points 0 points 0 points  What month? 0 points 0 points 0 points  What time? 0 points 0 points 0 points  Count back from 20 0 points 0 points 0 points  Months in reverse 0 points 0 points 0 points  Repeat phrase 0 points 0 points 0 points  Total Score 0 0 0    Immunization History  Administered Date(s) Administered  . Fluad Quad(high Dose  65+) 11/05/2018  . Influenza, High Dose Seasonal PF 11/29/2016  . Influenza, Seasonal, Injecte, Preservative Fre 11/06/2010  . Influenza,inj,Quad PF,6+ Mos 11/23/2012, 10/29/2013, 11/22/2014, 11/13/2015  . Influenza-Unspecified 10/29/2013,  10/28/2016, 11/28/2017  . PFIZER SARS-COV-2 Vaccination 02/22/2019, 03/15/2019  . Pneumococcal Conjugate-13 05/28/2017  . Pneumococcal Polysaccharide-23 06/03/2018  . Tdap 04/03/2010  . Zoster Recombinat (Shingrix) 12/26/2018, 04/10/2019    Qualifies for Shingles Vaccine? Shingrix series completed.   Tdap: Up to date  Flu Vaccine: Up to date  Pneumococcal Vaccine: Up to date  Covid-19 Vaccine: Up to date   Screening Tests Health Maintenance  Topic Date Due  . INFLUENZA VACCINE  08/29/2019  . TETANUS/TDAP  04/02/2020  . COLONOSCOPY  08/23/2022  . COVID-19 Vaccine  Completed  . PNA vac Low Risk Adult  Completed  . Hepatitis C Screening  Discontinued   Cancer Screenings:  Colorectal Screening: Completed 08/22/17. Repeat every 5 years;   Lung Cancer Screening: (Low Dose CT Chest recommended if Age 24-80 years, 30 pack-year currently smoking OR have quit w/in 15years.) does not qualify.   Additional Screening:  Hepatitis C Screening: does qualify; postponed  Vision Screening: Recommended annual ophthalmology exams for early detection of glaucoma and other disorders of the eye. Is the patient up to date with their annual eye exam?  Yes  Who is the provider or what is the name of the office in which the pt attends annual eye exams? Dr. Mallie Mussel  Dental Screening: Recommended annual dental exams for proper oral hygiene  Community Resource Referral:  CRR required this visit?  No       Plan:    I have personally reviewed and addressed the Medicare Annual Wellness questionnaire and have noted the following in the patient's chart:  A. Medical and social history B. Use of alcohol, tobacco or illicit drugs  C. Current medications and supplements D. Functional ability and status E.  Nutritional status F.  Physical activity G. Advance directives H. List of other physicians I.  Hospitalizations, surgeries, and ER visits in previous 12 months J.  [place] such as  hearing and vision if needed, cognitive and depression L. Referrals and appointments   In addition, I have reviewed and discussed with patient certain preventive protocols, quality metrics, and best practice recommendations. A written personalized care plan for preventive services as well as general preventive health recommendations were provided to patient.   Signed,  Clemetine Marker, LPN Nurse Health Advisor   Nurse Notes: none

## 2019-06-09 NOTE — Patient Instructions (Signed)
Marcus Gonzalez , Thank you for taking time to come for your Medicare Wellness Visit. I appreciate your ongoing commitment to your health goals. Please review the following plan we discussed and let me know if I can assist you in the future.   Screening recommendations/referrals: Colonoscopy: done 08/22/17. Repeat in 2024 Recommended yearly ophthalmology/optometry visit for glaucoma screening and checkup Recommended yearly dental visit for hygiene and checkup  Vaccinations: Influenza vaccine: done 11/03/18 Pneumococcal vaccine: done 06/03/18 Tdap vaccine: done 04/03/10 Shingles vaccine: Shingrix series completed   Covid-19: done 02/22/19 & 03/15/19  Advanced directives: Please bring a copy of your health care power of attorney and living will to the office at your convenience.  Conditions/risks identified: Keep up the great work!  Next appointment: Please follow up in one year for your Medicare Annual Wellness visit.    Preventive Care 70 Years and Older, Male Preventive care refers to lifestyle choices and visits with your health care provider that can promote health and wellness. What does preventive care include?  A yearly physical exam. This is also called an annual well check.  Dental exams once or twice a year.  Routine eye exams. Ask your health care provider how often you should have your eyes checked.  Personal lifestyle choices, including:  Daily care of your teeth and gums.  Regular physical activity.  Eating a healthy diet.  Avoiding tobacco and drug use.  Limiting alcohol use.  Practicing safe sex.  Taking low doses of aspirin every day.  Taking vitamin and mineral supplements as recommended by your health care provider. What happens during an annual well check? The services and screenings done by your health care provider during your annual well check will depend on your age, overall health, lifestyle risk factors, and family history of disease. Counseling  Your  health care provider may ask you questions about your:  Alcohol use.  Tobacco use.  Drug use.  Emotional well-being.  Home and relationship well-being.  Sexual activity.  Eating habits.  History of falls.  Memory and ability to understand (cognition).  Work and work Statistician. Screening  You may have the following tests or measurements:  Height, weight, and BMI.  Blood pressure.  Lipid and cholesterol levels. These may be checked every 5 years, or more frequently if you are over 1 years old.  Skin check.  Lung cancer screening. You may have this screening every year starting at age 60 if you have a 30-pack-year history of smoking and currently smoke or have quit within the past 15 years.  Fecal occult blood test (FOBT) of the stool. You may have this test every year starting at age 86.  Flexible sigmoidoscopy or colonoscopy. You may have a sigmoidoscopy every 5 years or a colonoscopy every 10 years starting at age 72.  Prostate cancer screening. Recommendations will vary depending on your family history and other risks.  Hepatitis C blood test.  Hepatitis B blood test.  Sexually transmitted disease (STD) testing.  Diabetes screening. This is done by checking your blood sugar (glucose) after you have not eaten for a while (fasting). You may have this done every 1-3 years.  Abdominal aortic aneurysm (AAA) screening. You may need this if you are a current or former smoker.  Osteoporosis. You may be screened starting at age 66 if you are at high risk. Talk with your health care provider about your test results, treatment options, and if necessary, the need for more tests. Vaccines  Your health care provider  may recommend certain vaccines, such as:  Influenza vaccine. This is recommended every year.  Tetanus, diphtheria, and acellular pertussis (Tdap, Td) vaccine. You may need a Td booster every 10 years.  Zoster vaccine. You may need this after age  83.  Pneumococcal 13-valent conjugate (PCV13) vaccine. One dose is recommended after age 14.  Pneumococcal polysaccharide (PPSV23) vaccine. One dose is recommended after age 78. Talk to your health care provider about which screenings and vaccines you need and how often you need them. This information is not intended to replace advice given to you by your health care provider. Make sure you discuss any questions you have with your health care provider. Document Released: 02/10/2015 Document Revised: 10/04/2015 Document Reviewed: 11/15/2014 Elsevier Interactive Patient Education  2017 [employer] Prevention in the Home Falls can cause injuries. They can happen to people of all ages. There are many things you can do to make your home safe and to help prevent falls. What can I do on the outside of my home?  Regularly fix the edges of walkways and driveways and fix any cracks.  Remove anything that might make you trip as you walk through a door, such as a raised step or threshold.  Trim any bushes or trees on the path to your home.  Use bright outdoor lighting.  Clear any walking paths of anything that might make someone trip, such as rocks or tools.  Regularly check to see if handrails are loose or broken. Make sure that both sides of any steps have handrails.  Any raised decks and porches should have guardrails on the edges.  Have any leaves, snow, or ice cleared regularly.  Use sand or salt on walking paths during winter.  Clean up any spills in your garage right away. This includes oil or grease spills. What can I do in the bathroom?  Use night lights.  Install grab bars by the toilet and in the tub and shower. Do not use towel bars as grab bars.  Use non-skid mats or decals in the tub or shower.  If you need to sit down in the shower, use a plastic, non-slip stool.  Keep the floor dry. Clean up any water that spills on the floor as soon as it happens.  Remove  soap buildup in the tub or shower regularly.  Attach bath mats securely with double-sided non-slip rug tape.  Do not have throw rugs and other things on the floor that can make you trip. What can I do in the bedroom?  Use night lights.  Make sure that you have a light by your bed that is easy to reach.  Do not use any sheets or blankets that are too big for your bed. They should not hang down onto the floor.  Have a firm chair that has side arms. You can use this for support while you get dressed.  Do not have throw rugs and other things on the floor that can make you trip. What can I do in the kitchen?  Clean up any spills right away.  Avoid walking on wet floors.  Keep items that you use a lot in easy-to-reach places.  If you need to reach something above you, use a strong step stool that has a grab bar.  Keep electrical cords out of the way.  Do not use floor polish or wax that makes floors slippery. If you must use wax, use non-skid floor wax.  Do not have throw rugs  and other things on the floor that can make you trip. What can I do with my stairs?  Do not leave any items on the stairs.  Make sure that there are handrails on both sides of the stairs and use them. Fix handrails that are broken or loose. Make sure that handrails are as long as the stairways.  Check any carpeting to make sure that it is firmly attached to the stairs. Fix any carpet that is loose or worn.  Avoid having throw rugs at the top or bottom of the stairs. If you do have throw rugs, attach them to the floor with carpet tape.  Make sure that you have a light switch at the top of the stairs and the bottom of the stairs. If you do not have them, ask someone to add them for you. What else can I do to help prevent falls?  Wear shoes that:  Do not have high heels.  Have rubber bottoms.  Are comfortable and fit you well.  Are closed at the toe. Do not wear sandals.  If you use a  stepladder:  Make sure that it is fully opened. Do not climb a closed stepladder.  Make sure that both sides of the stepladder are locked into place.  Ask someone to hold it for you, if possible.  Clearly mark and make sure that you can see:  Any grab bars or handrails.  First and last steps.  Where the edge of each step is.  Use tools that help you move around (mobility aids) if they are needed. These include:  Canes.  Walkers.  Scooters.  Crutches.  Turn on the lights when you go into a dark area. Replace any light bulbs as soon as they burn out.  Set up your furniture so you have a clear path. Avoid moving your furniture around.  If any of your floors are uneven, fix them.  If there are any pets around you, be aware of where they are.  Review your medicines with your doctor. Some medicines can make you feel dizzy. This can increase your chance of falling. Ask your doctor what other things that you can do to help prevent falls. This information is not intended to replace advice given to you by your health care provider. Make sure you discuss any questions you have with your health care provider. Document Released: 11/10/2008 Document Revised: 06/22/2015 Document Reviewed: 02/18/2014 Elsevier Interactive Patient Education  2017 [employer].

## 2019-08-09 DIAGNOSIS — N529 Male erectile dysfunction, unspecified: Secondary | ICD-10-CM | POA: Diagnosis not present

## 2019-08-09 DIAGNOSIS — Z923 Personal history of irradiation: Secondary | ICD-10-CM | POA: Diagnosis not present

## 2019-08-09 DIAGNOSIS — Z08 Encounter for follow-up examination after completed treatment for malignant neoplasm: Secondary | ICD-10-CM | POA: Diagnosis not present

## 2019-08-09 DIAGNOSIS — Z9229 Personal history of other drug therapy: Secondary | ICD-10-CM | POA: Diagnosis not present

## 2019-08-09 DIAGNOSIS — Z8546 Personal history of malignant neoplasm of prostate: Secondary | ICD-10-CM | POA: Diagnosis not present

## 2019-09-16 DIAGNOSIS — D485 Neoplasm of uncertain behavior of skin: Secondary | ICD-10-CM | POA: Diagnosis not present

## 2019-09-16 DIAGNOSIS — X32XXXA Exposure to sunlight, initial encounter: Secondary | ICD-10-CM | POA: Diagnosis not present

## 2019-09-16 DIAGNOSIS — D225 Melanocytic nevi of trunk: Secondary | ICD-10-CM | POA: Diagnosis not present

## 2019-09-16 DIAGNOSIS — L538 Other specified erythematous conditions: Secondary | ICD-10-CM | POA: Diagnosis not present

## 2019-09-16 DIAGNOSIS — L820 Inflamed seborrheic keratosis: Secondary | ICD-10-CM | POA: Diagnosis not present

## 2019-09-16 DIAGNOSIS — D2262 Melanocytic nevi of left upper limb, including shoulder: Secondary | ICD-10-CM | POA: Diagnosis not present

## 2019-09-16 DIAGNOSIS — D2271 Melanocytic nevi of right lower limb, including hip: Secondary | ICD-10-CM | POA: Diagnosis not present

## 2019-09-16 DIAGNOSIS — C44719 Basal cell carcinoma of skin of left lower limb, including hip: Secondary | ICD-10-CM | POA: Diagnosis not present

## 2019-09-16 DIAGNOSIS — Z85828 Personal history of other malignant neoplasm of skin: Secondary | ICD-10-CM | POA: Diagnosis not present

## 2019-09-16 DIAGNOSIS — L570 Actinic keratosis: Secondary | ICD-10-CM | POA: Diagnosis not present

## 2019-09-16 DIAGNOSIS — D2261 Melanocytic nevi of right upper limb, including shoulder: Secondary | ICD-10-CM | POA: Diagnosis not present

## 2019-09-16 DIAGNOSIS — L821 Other seborrheic keratosis: Secondary | ICD-10-CM | POA: Diagnosis not present

## 2019-09-17 DIAGNOSIS — H18529 Epithelial (juvenile) corneal dystrophy, unspecified eye: Secondary | ICD-10-CM | POA: Diagnosis not present

## 2019-11-02 DIAGNOSIS — C44719 Basal cell carcinoma of skin of left lower limb, including hip: Secondary | ICD-10-CM | POA: Diagnosis not present

## 2019-11-08 DIAGNOSIS — H18521 Epithelial (juvenile) corneal dystrophy, right eye: Secondary | ICD-10-CM | POA: Diagnosis not present

## 2019-11-09 ENCOUNTER — Telehealth: Payer: Self-pay

## 2019-11-09 NOTE — Telephone Encounter (Signed)
Scheduled appt for prostate check per pt

## 2019-11-10 ENCOUNTER — Encounter: Payer: Self-pay | Admitting: Family Medicine

## 2019-11-10 ENCOUNTER — Other Ambulatory Visit: Payer: Self-pay

## 2019-11-10 ENCOUNTER — Ambulatory Visit (INDEPENDENT_AMBULATORY_CARE_PROVIDER_SITE_OTHER): Payer: Medicare Other | Admitting: Family Medicine

## 2019-11-10 VITALS — BP 130/80 | HR 76 | Ht 69.0 in | Wt 182.0 lb

## 2019-11-10 DIAGNOSIS — Z23 Encounter for immunization: Secondary | ICD-10-CM

## 2019-11-10 DIAGNOSIS — N410 Acute prostatitis: Secondary | ICD-10-CM | POA: Diagnosis not present

## 2019-11-10 LAB — POCT URINALYSIS DIPSTICK
Blood, UA: NEGATIVE
Glucose, UA: NEGATIVE
Ketones, UA: NEGATIVE
Leukocytes, UA: NEGATIVE
Nitrite, UA: NEGATIVE
Protein, UA: NEGATIVE
Spec Grav, UA: 1.02 (ref 1.010–1.025)
Urobilinogen, UA: 0.2 E.U./dL
pH, UA: 6 (ref 5.0–8.0)

## 2019-11-10 MED ORDER — SULFAMETHOXAZOLE-TRIMETHOPRIM 800-160 MG PO TABS: 1.0000 | ORAL_TABLET | Freq: Two times a day (BID) | ORAL | 0 refills | Status: DC

## 2019-11-10 NOTE — Progress Notes (Signed)
Date:  11/10/2019   Name:  Marcus Gonzalez   DOB:  1950-10-25   MRN:  [id number]   Chief Complaint: prostate infection and Flu Vaccine  Male GU Problem The patient's pertinent negatives include no genital injury, genital itching, genital lesions, penile discharge, scrotal swelling or testicular pain. Primary symptoms comment: semen brown. This is a new problem. The current episode started in the past 7 days (Monday). The problem occurs intermittently. Pertinent negatives include no abdominal pain, anorexia, chest pain, chills, constipation, coughing, diarrhea, dysuria, fever, flank pain, frequency, headaches, hematuria, hesitancy, nausea, painful intercourse, rash, shortness of breath, sore throat or urgency.    Lab Results  Component Value Date   CREATININE 1.05 12/15/2018   BUN 19 12/15/2018   NA 141 12/15/2018   K 5.0 12/15/2018   CL 101 12/15/2018   CO2 24 12/15/2018   Lab Results  Component Value Date   CHOL 168 12/15/2018   HDL 70 12/15/2018   LDLCALC 86 12/15/2018   TRIG 61 12/15/2018   CHOLHDL 2.3 11/29/2016   No results found for: TSH No results found for: HGBA1C No results found for: WBC, HGB, HCT, MCV, PLT No results found for: ALT, AST, GGT, ALKPHOS, BILITOT   Review of Systems  Constitutional: Negative for chills and fever.  HENT: Negative for drooling, ear discharge, ear pain and sore throat.   Respiratory: Negative for cough, shortness of breath and wheezing.   Cardiovascular: Negative for chest pain, palpitations and leg swelling.  Gastrointestinal: Negative for abdominal pain, anorexia, blood in stool, constipation, diarrhea and nausea.  Endocrine: Negative for polydipsia.  Genitourinary: Negative for discharge, dysuria, flank pain, frequency, hematuria, hesitancy, scrotal swelling, testicular pain and urgency.  Musculoskeletal: Negative for back pain, myalgias and neck pain.  Skin: Negative for rash.  Allergic/Immunologic: Negative for environmental  allergies.  Neurological: Negative for dizziness and headaches.  Hematological: Does not bruise/bleed easily.  Psychiatric/Behavioral: Negative for suicidal ideas. The patient is not nervous/anxious.     Patient Active Problem List   Diagnosis Date Noted  . Arthralgia of hip 06/03/2018  . 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 Encounter for screening colonoscopy   . Benign neoplasm of ascending colon   . Polyp of sigmoid colon   . Rectal polyp   . 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 Hyperlipidemia 11/29/2016  . Degeneration, intervertebral disc, cervical 04/19/2016  . Migraine without status migrainosus, not intractable 04/19/2016  . Essential hypertension 04/19/2016  . Cephalalgia 05/24/2014  . Routine general medical examination at a health care facility 05/24/2014  . Anxiety attack 05/24/2014  . Arthritis 05/24/2014  . Benign prostatic hyperplasia without lower urinary tract symptoms 05/24/2014  . Narrowing of intervertebral disc space 05/24/2014  . History of migraine headaches 05/24/2014  . Personal history of other malignant neoplasm of skin 02/28/2012    Allergies  Allergen Reactions  . Cephalexin     Past Surgical History:  Procedure Laterality Date  . BASAL CELL CARCINOMA EXCISION     arm and leg  . COLONOSCOPY  2013   cleared for 5 yrs- Dr Allen Norris  . COLONOSCOPY WITH PROPOFOL N/A 08/22/2017   Procedure: COLONOSCOPY WITH PROPOFOL;  Surgeon: Lucilla Lame, MD;  Location: [place];  Service: Endoscopy;  Laterality: N/A;  . POLYPECTOMY  08/22/2017   Procedure: POLYPECTOMY INTESTINAL;  Surgeon: Lucilla Lame, MD;  Location: [place];  Service: Endoscopy;;  . skin ca removed      Social History   Tobacco  Use  . Smoking status: Former Smoker    Packs/day: 1.00    Years: 29.00    Pack years: 29.00    Types: Cigarettes    Quit date: 1999    Years since quitting: 22.7  . Smokeless tobacco: Never Used  . Tobacco comment: smoking  cessation materials not required  Vaping Use  . Vaping Use: Never used  Substance Use Topics  . Alcohol use: Yes    Alcohol/week: 14.0 standard drinks    Types: 14 Glasses of wine per week  . Drug use: No     Medication list has been reviewed and updated.  Current Meds  Medication Sig  . amLODipine (NORVASC) 2.5 MG tablet Take 1 tablet (2.5 mg total) by mouth daily.  Marland Kitchen Apple Cider Vinegar 188 MG CAPS Take by mouth.  . Ascorbic Acid (VITAMIN C) 100 MG tablet Take by mouth.  . B Complex Vitamins (VITAMIN B-COMPLEX) TABS Take by mouth.  Raelyn Ensign Pollen 1000 MG TABS Take by mouth.  . Bilberry 100 MG CAPS Take by mouth.  . Calcium Citrate 1040 MG TABS Take by mouth.  . Cinnamon 500 MG capsule Take by mouth.  . Coenzyme Q10 (COQ-10) 100 MG CAPS Take by mouth.  . Cranberry 200 MG CAPS Take by mouth.  . cyclobenzaprine (FLEXERIL) 10 MG tablet Take 1 tablet (10 mg total) by mouth 3 (three) times daily as needed. for muscle spams  . Echinacea 500 MG CAPS Take by mouth.  . etodolac (LODINE) 500 MG tablet One twice a day  . Flaxseed, Linseed, (FLAXSEED OIL PO) Take by mouth.  . Garlic 7169 MG CAPS Take by mouth.  . Glucosamine-Chondroitin (GLUCOSAMINE CHONDR COMPLEX PO) Take by mouth.  Marland Kitchen lisinopril (ZESTRIL) 20 MG tablet Take 1 tablet (20 mg total) by mouth daily.  . Multiple Vitamins-Minerals (MULTIVITAMIN ADULT PO) Take by mouth.  . Multiple Vitamins-Minerals (MULTIVITAMIN WITH MINERALS) tablet Take 1 tablet by mouth daily.  . propranolol ER (INDERAL LA) 80 MG 24 hr capsule Take 1 capsule (80 mg total) by mouth daily.  Berniece Pap Cartilage 500 MG CAPS Take by mouth.  Marland Kitchen UNABLE TO FIND [employer] toxifier/regenerator herb    PHQ 2/9 Scores 11/10/2019 06/09/2019 12/15/2018 09/04/2018  PHQ - 2 Score 0 0 0 0  PHQ- 9 Score 0 - 0 0    GAD 7 : Generalized Anxiety Score 11/10/2019 12/15/2018  Nervous, Anxious, on Edge 0 0  Control/stop worrying 0 0  Worry too much - different things 0 0  Trouble  relaxing 0 0  Restless 0 0  Easily annoyed or irritable 0 0  Afraid - awful might happen 0 0  Total GAD 7 Score 0 0    BP Readings from Last 3 Encounters:  11/10/19 130/80  06/09/19 118/80  12/15/18 120/78    Physical Exam Vitals and nursing note reviewed.  HENT:     Head: Normocephalic.     Right Ear: Tympanic membrane, ear canal and external ear normal.     Left Ear: Tympanic membrane, ear canal and external ear normal.     Nose: Nose normal. No congestion or rhinorrhea.  Eyes:     General: No scleral icterus.       Right eye: No discharge.        Left eye: No discharge.     Conjunctiva/sclera: Conjunctivae normal.     Pupils: Pupils are equal, round, and reactive to light.  Neck:     Thyroid: No thyromegaly.  Vascular: No JVD.     Trachea: No tracheal deviation.  Cardiovascular:     Rate and Rhythm: Normal rate and regular rhythm.     Heart sounds: Normal heart sounds. No murmur heard.  No friction rub. No gallop.   Pulmonary:     Effort: No respiratory distress.     Breath sounds: Normal breath sounds. No wheezing, rhonchi or rales.  Abdominal:     General: Bowel sounds are normal.     Palpations: Abdomen is soft. There is no mass.     Tenderness: There is no abdominal tenderness. There is no guarding or rebound.  Genitourinary:    Prostate: Normal. Not enlarged, not tender and no nodules present.     Rectum: Normal. No mass.  Musculoskeletal:        General: No tenderness. Normal range of motion.     Cervical back: Normal range of motion and neck supple.  Lymphadenopathy:     Cervical: No cervical adenopathy.  Skin:    General: Skin is warm.     Findings: No rash.  Neurological:     Mental Status: He is alert and oriented to person, place, and time.     Cranial Nerves: No cranial nerve deficit.     Deep Tendon Reflexes: Reflexes are normal and symmetric.     Wt Readings from Last 3 Encounters:  11/10/19 182 lb (82.6 kg)  06/09/19 178 lb 12.8 oz  (81.1 kg)  12/15/18 172 lb (78 kg)    BP 130/80   Pulse 76   Ht 5\' 9"  (1.753 m)   Wt 182 lb (82.6 kg)   BMI 26.88 kg/m   Assessment and Plan: 1. Acute prostatitis acute.  Episodic.  Patient noticed some hemospermia which was similar to when he had an episode of infection in his prostate prior DRE was unremarkable for tenderness or enlargement.  At this point time we will treat with sulfamethoxazole 1 tablet twice a day for 10 days for what I suspect is a subacute prostatitis. - POCT Urinalysis Dipstick - sulfamethoxazole-trimethoprim (BACTRIM DS) 800-160 MG tablet; Take 1 tablet by mouth 2 (two) times daily.  Dispense: 20 tablet; Refill: 0  2. Need for immunization against influenza discussed and administered. - Flu Vaccine QUAD High Dose(Fluad)

## 2019-12-16 ENCOUNTER — Other Ambulatory Visit: Payer: Self-pay

## 2019-12-16 ENCOUNTER — Encounter: Payer: Self-pay | Admitting: Family Medicine

## 2019-12-16 ENCOUNTER — Ambulatory Visit (INDEPENDENT_AMBULATORY_CARE_PROVIDER_SITE_OTHER): Payer: Medicare Other | Admitting: Family Medicine

## 2019-12-16 VITALS — BP 110/70 | HR 72 | Ht 69.0 in | Wt 179.0 lb

## 2019-12-16 DIAGNOSIS — G43909 Migraine, unspecified, not intractable, without status migrainosus: Secondary | ICD-10-CM

## 2019-12-16 DIAGNOSIS — M25559 Pain in unspecified hip: Secondary | ICD-10-CM | POA: Diagnosis not present

## 2019-12-16 DIAGNOSIS — I10 Essential (primary) hypertension: Secondary | ICD-10-CM | POA: Diagnosis not present

## 2019-12-16 DIAGNOSIS — M5030 Other cervical disc degeneration, unspecified cervical region: Secondary | ICD-10-CM | POA: Diagnosis not present

## 2019-12-16 MED ORDER — PROPRANOLOL HCL ER 80 MG PO CP24: 80.0000 mg | ORAL_CAPSULE | Freq: Every day | ORAL | 1 refills | Status: DC

## 2019-12-16 MED ORDER — ETODOLAC 500 MG PO TABS: ORAL_TABLET | ORAL | 1 refills | Status: DC

## 2019-12-16 MED ORDER — CYCLOBENZAPRINE HCL 10 MG PO TABS: 10.0000 mg | ORAL_TABLET | Freq: Three times a day (TID) | ORAL | 1 refills | Status: DC | PRN

## 2019-12-16 MED ORDER — AMLODIPINE BESYLATE 2.5 MG PO TABS: 2.5000 mg | ORAL_TABLET | Freq: Every day | ORAL | 1 refills | Status: DC

## 2019-12-16 MED ORDER — LISINOPRIL 20 MG PO TABS: 20.0000 mg | ORAL_TABLET | Freq: Every day | ORAL | 1 refills | Status: DC

## 2019-12-16 NOTE — Patient Instructions (Addendum)
Managing Your Hypertension Hypertension is commonly called high blood pressure. This is when the force of your blood pressing against the walls of your arteries is too strong. Arteries are blood vessels that carry blood from your heart throughout your body. Hypertension forces the heart to work harder to pump blood, and may cause the arteries to become narrow or stiff. Having untreated or uncontrolled hypertension can cause heart attack, stroke, kidney disease, and other problems. What are blood pressure readings? A blood pressure reading consists of a higher number over a lower number. Ideally, your blood pressure should be below 120/80. The first ("top") number is called the systolic pressure. It is a measure of the pressure in your arteries as your heart beats. The second ("bottom") number is called the diastolic pressure. It is a measure of the pressure in your arteries as the heart relaxes. What does my blood pressure reading mean? Blood pressure is classified into four stages. Based on your blood pressure reading, your health care provider may use the following stages to determine what type of treatment you need, if any. Systolic pressure and diastolic pressure are measured in a unit called mm Hg. Normal  Systolic pressure: below 120.  Diastolic pressure: below 80. Elevated  Systolic pressure: 120-129.  Diastolic pressure: below 80. Hypertension stage 1  Systolic pressure: 130-139.  Diastolic pressure: 80-89. Hypertension stage 2  Systolic pressure: 140 or above.  Diastolic pressure: 90 or above. What health risks are associated with hypertension? Managing your hypertension is an important responsibility. Uncontrolled hypertension can lead to:  A heart attack.  A stroke.  A weakened blood vessel (aneurysm).  Heart failure.  Kidney damage.  Eye damage.  Metabolic syndrome.  Memory and concentration problems. What changes can I make to manage my  hypertension? Hypertension can be managed by making lifestyle changes and possibly by taking medicines. Your health care provider will help you make a plan to bring your blood pressure within a normal range. Eating and drinking   Eat a diet that is high in fiber and potassium, and low in salt (sodium), added sugar, and fat. An example eating plan is called the DASH (Dietary Approaches to Stop Hypertension) diet. To eat this way: ? Eat plenty of fresh fruits and vegetables. Try to fill half of your plate at each meal with fruits and vegetables. ? Eat whole grains, such as whole wheat pasta, brown rice, or whole grain bread. Fill about one quarter of your plate with whole grains. ? Eat low-fat diary products. ? Avoid fatty cuts of meat, processed or cured meats, and poultry with skin. Fill about one quarter of your plate with lean proteins such as fish, chicken without skin, beans, eggs, and tofu. ? Avoid premade and processed foods. These tend to be higher in sodium, added sugar, and fat.  Reduce your daily sodium intake. Most people with hypertension should eat less than 1,500 mg of sodium a day.  Limit alcohol intake to no more than 1 drink a day for nonpregnant women and 2 drinks a day for men. One drink equals 12 oz of beer, 5 oz of wine, or 1 oz of hard liquor. Lifestyle  Work with your health care provider to maintain a healthy body weight, or to lose weight. Ask what an ideal weight is for you.  Get at least 30 minutes of exercise that causes your heart to beat faster (aerobic exercise) most days of the week. Activities may include walking, swimming, or biking.  Include exercise   to strengthen your muscles (resistance exercise), such as weight lifting, as part of your weekly exercise routine. Try to do these types of exercises for 30 minutes at least 3 days a week.  Do not use any products that contain nicotine or tobacco, such as cigarettes and e-cigarettes. If you need help quitting,  ask your health care provider.  Control any long-term (chronic) conditions you have, such as high cholesterol or diabetes. Monitoring  Monitor your blood pressure at home as told by your health care provider. Your personal target blood pressure may vary depending on your medical conditions, your age, and other factors.  Have your blood pressure checked regularly, as often as told by your health care provider. Working with your health care provider  Review all the medicines you take with your health care provider because there may be side effects or interactions.  Talk with your health care provider about your diet, exercise habits, and other lifestyle factors that may be contributing to hypertension.  Visit your health care provider regularly. Your health care provider can help you create and adjust your plan for managing hypertension. Will I need medicine to control my blood pressure? Your health care provider may prescribe medicine if lifestyle changes are not enough to get your blood pressure under control, and if:  Your systolic blood pressure is 130 or higher.  Your diastolic blood pressure is 80 or higher. Take medicines only as told by your health care provider. Follow the directions carefully. Blood pressure medicines must be taken as prescribed. The medicine does not work as well when you skip doses. Skipping doses also puts you at risk for problems. Contact a health care provider if:  You think you are having a reaction to medicines you have taken.  You have repeated (recurrent) headaches.  You feel dizzy.  You have swelling in your ankles.  You have trouble with your vision. Get help right away if:  You develop a severe headache or confusion.  You have unusual weakness or numbness, or you feel faint.  You have severe pain in your chest or abdomen.  You vomit repeatedly.  You have trouble breathing. Summary  Hypertension is when the force of blood pumping  through your arteries is too strong. If this condition is not controlled, it may put you at risk for serious complications.  Your personal target blood pressure may vary depending on your medical conditions, your age, and other factors. For most people, a normal blood pressure is less than 120/80.  Hypertension is managed by lifestyle changes, medicines, or both. Lifestyle changes include weight loss, eating a healthy, low-sodium diet, exercising more, and limiting alcohol. This information is not intended to replace advice given to you by your health care provider. Make sure you discuss any questions you have with your health care provider. Document Revised: 05/08/2018 Document Reviewed: 12/13/2015 Elsevier Patient Education  [employer].  Insomnia Insomnia is a sleep disorder that makes it difficult to fall asleep or stay asleep. Insomnia can cause fatigue, low energy, difficulty concentrating, mood swings, and poor performance at work or school. There are three different ways to classify insomnia:  Difficulty falling asleep.  Difficulty staying asleep.  Waking up too early in the morning. Any type of insomnia can be long-term (chronic) or short-term (acute). Both are common. Short-term insomnia usually lasts for three months or less. Chronic insomnia occurs at least three times a week for longer than three months. What are the causes? Insomnia may be caused  by another condition, situation, or substance, such as:  Anxiety.  Certain medicines.  Gastroesophageal reflux disease (GERD) or other gastrointestinal conditions.  Asthma or other breathing conditions.  Restless legs syndrome, sleep apnea, or other sleep disorders.  Chronic pain.  Menopause.  Stroke.  Abuse of alcohol, tobacco, or illegal drugs.  Mental health conditions, such as depression.  Caffeine.  Neurological disorders, such as Alzheimer's disease.  An overactive thyroid  (hyperthyroidism). Sometimes, the cause of insomnia may not be known. What increases the risk? Risk factors for insomnia include:  Gender. Women are affected more often than men.  Age. Insomnia is more common as you get older.  Stress.  Lack of exercise.  Irregular work schedule or working night shifts.  Traveling between different time zones.  Certain medical and mental health conditions. What are the signs or symptoms? If you have insomnia, the main symptom is having trouble falling asleep or having trouble staying asleep. This may lead to other symptoms, such as:  Feeling fatigued or having low energy.  Feeling nervous about going to sleep.  Not feeling rested in the morning.  Having trouble concentrating.  Feeling irritable, anxious, or depressed. How is this diagnosed? This condition may be diagnosed based on:  Your symptoms and medical history. Your health care provider may ask about: ? Your sleep habits. ? Any medical conditions you have. ? Your mental health.  A physical exam. How is this treated? Treatment for insomnia depends on the cause. Treatment may focus on treating an underlying condition that is causing insomnia. Treatment may also include:  Medicines to help you sleep.  Counseling or therapy.  Lifestyle adjustments to help you sleep better. Follow these instructions at home: Eating and drinking   Limit or avoid alcohol, caffeinated beverages, and cigarettes, especially close to bedtime. These can disrupt your sleep.  Do not eat a large meal or eat spicy foods right before bedtime. This can lead to digestive discomfort that can make it hard for you to sleep. Sleep habits   Keep a sleep diary to help you and your health care provider figure out what could be causing your insomnia. Write down: ? When you sleep. ? When you wake up during the night. ? How well you sleep. ? How rested you feel the next day. ? Any side effects of medicines you  are taking. ? What you eat and drink.  Make your bedroom a dark, comfortable place where it is easy to fall asleep. ? Put up shades or blackout curtains to block light from outside. ? Use a white noise machine to block noise. ? Keep the temperature cool.  Limit screen use before bedtime. This includes: ? Watching TV. ? Using your smartphone, tablet, or computer.  Stick to a routine that includes going to bed and waking up at the same times every day and night. This can help you fall asleep faster. Consider making a quiet activity, such as reading, part of your nighttime routine.  Try to avoid taking naps during the day so that you sleep better at night.  Get out of bed if you are still awake after 15 minutes of trying to sleep. Keep the lights down, but try reading or doing a quiet activity. When you feel sleepy, go back to bed. General instructions  Take over-the-counter and prescription medicines only as told by your health care provider.  Exercise regularly, as told by your health care provider. Avoid exercise starting several hours before bedtime.  Use relaxation  techniques to manage stress. Ask your health care provider to suggest some techniques that may work well for you. These may include: ? Breathing exercises. ? Routines to release muscle tension. ? Visualizing peaceful scenes.  Make sure that you drive carefully. Avoid driving if you feel very sleepy.  Keep all follow-up visits as told by your health care provider. This is important. Contact a health care provider if:  You are tired throughout the day.  You have trouble in your daily routine due to sleepiness.  You continue to have sleep problems, or your sleep problems get worse. Get help right away if:  You have serious thoughts about hurting yourself or someone else. If you ever feel like you may hurt yourself or others, or have thoughts about taking your own life, get help right away. You can go to your nearest  emergency department or call:  Your local emergency services (911 in the [place]  A suicide crisis helpline, such as the [facility] at 303-452-9577. This is open 24 hours a day. Summary  Insomnia is a sleep disorder that makes it difficult to fall asleep or stay asleep.  Insomnia can be long-term (chronic) or short-term (acute).  Treatment for insomnia depends on the cause. Treatment may focus on treating an underlying condition that is causing insomnia.  Keep a sleep diary to help you and your health care provider figure out what could be causing your insomnia. This information is not intended to replace advice given to you by your health care provider. Make sure you discuss any questions you have with your health care provider. Document Revised: 12/27/2016 Document Reviewed: 10/24/2016 Elsevier Patient Education  2020 [employer].

## 2019-12-16 NOTE — Progress Notes (Signed)
Date:  12/16/2019   Name:  Marcus Gonzalez   DOB:  December 20, 1950   MRN:  [id number]   Chief Complaint: Hypertension, Muscle Pain, and Headache  Hypertension This is a chronic problem. The current episode started more than 1 year ago. The problem has been gradually improving since onset. The problem is controlled. Associated symptoms include headaches. Pertinent negatives include no anxiety, blurred vision, chest pain, malaise/fatigue, neck pain, orthopnea, palpitations, peripheral edema, PND, shortness of breath or sweats. There are no associated agents to hypertension. Past treatments include calcium channel blockers. The current treatment provides mild improvement. There are no compliance problems.  There is no history of angina, kidney disease, CAD/MI, CVA, heart failure, left ventricular hypertrophy, PVD or retinopathy. There is no history of chronic renal disease, a hypertension causing med or renovascular disease.  Muscle Pain This is a chronic problem. The current episode started more than 1 year ago. The problem occurs intermittently. The problem has been gradually worsening since onset. The pain is medium. Associated symptoms include headaches. Pertinent negatives include no abdominal pain, chest pain, constipation, diarrhea, dysuria, fever, nausea, rash, shortness of breath or wheezing. Past treatments include acetaminophen. The treatment provided mild relief. There is no swelling present.  Headache  This is a chronic problem. The current episode started more than 1 year ago. The problem has been waxing and waning. Pertinent negatives include no abdominal pain, back pain, blurred vision, coughing, dizziness, ear pain, fever, nausea, neck pain or sore throat. His past medical history is significant for hypertension.    Lab Results  Component Value Date   CREATININE 1.05 12/15/2018   BUN 19 12/15/2018   NA 141 12/15/2018   K 5.0 12/15/2018   CL 101 12/15/2018   CO2 24 12/15/2018    Lab Results  Component Value Date   CHOL 168 12/15/2018   HDL 70 12/15/2018   LDLCALC 86 12/15/2018   TRIG 61 12/15/2018   CHOLHDL 2.3 11/29/2016   No results found for: TSH No results found for: HGBA1C No results found for: WBC, HGB, HCT, MCV, PLT No results found for: ALT, AST, GGT, ALKPHOS, BILITOT   Review of Systems  Constitutional: Negative for chills, fever and malaise/fatigue.  HENT: Negative for drooling, ear discharge, ear pain and sore throat.   Eyes: Negative for blurred vision.  Respiratory: Negative for cough, shortness of breath and wheezing.   Cardiovascular: Negative for chest pain, palpitations, orthopnea, leg swelling and PND.  Gastrointestinal: Negative for abdominal pain, blood in stool, constipation, diarrhea and nausea.  Endocrine: Negative for polydipsia.  Genitourinary: Negative for dysuria, frequency, hematuria and urgency.  Musculoskeletal: Negative for back pain, myalgias and neck pain.  Skin: Negative for rash.  Allergic/Immunologic: Negative for environmental allergies.  Neurological: Positive for headaches. Negative for dizziness.  Hematological: Does not bruise/bleed easily.  Psychiatric/Behavioral: Negative for suicidal ideas. The patient is not nervous/anxious.     Patient Active Problem List   Diagnosis Date Noted  . Arthralgia of hip 06/03/2018  . 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 Encounter for screening colonoscopy   . Benign neoplasm of ascending colon   . Polyp of sigmoid colon   . Rectal polyp   . 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 Hyperlipidemia 11/29/2016  . Degeneration, intervertebral disc, cervical 04/19/2016  . Migraine without status migrainosus, not intractable 04/19/2016  . Essential hypertension 04/19/2016  . Cephalalgia 05/24/2014  . Routine general medical examination at a health care facility 05/24/2014  . Anxiety  attack 05/24/2014  . Arthritis 05/24/2014  . Benign prostatic hyperplasia without  lower urinary tract symptoms 05/24/2014  . Narrowing of intervertebral disc space 05/24/2014  . History of migraine headaches 05/24/2014  . Personal history of other malignant neoplasm of skin 02/28/2012    Allergies  Allergen Reactions  . Cephalexin     Past Surgical History:  Procedure Laterality Date  . BASAL CELL CARCINOMA EXCISION     arm and leg  . COLONOSCOPY  2013   cleared for 5 yrs- Dr Allen Norris  . COLONOSCOPY WITH PROPOFOL N/A 08/22/2017   Procedure: COLONOSCOPY WITH PROPOFOL;  Surgeon: Lucilla Lame, MD;  Location: [facility];  Service: Endoscopy;  Laterality: N/A;  . POLYPECTOMY  08/22/2017   Procedure: POLYPECTOMY INTESTINAL;  Surgeon: Lucilla Lame, MD;  Location: [facility];  Service: Endoscopy;;  . skin ca removed      Social History   Tobacco Use  . Smoking status: Former Smoker    Packs/day: 1.00    Years: 29.00    Pack years: 29.00    Types: Cigarettes    Quit date: 1999    Years since quitting: 22.8  . Smokeless tobacco: Never Used  . Tobacco comment: smoking cessation materials not required  Vaping Use  . Vaping Use: Never used  Substance Use Topics  . Alcohol use: Yes    Alcohol/week: 14.0 standard drinks    Types: 14 Glasses of wine per week  . Drug use: No     Medication list has been reviewed and updated.  Current Meds  Medication Sig  . amLODipine (NORVASC) 2.5 MG tablet Take 1 tablet (2.5 mg total) by mouth daily.  Marland Kitchen Apple Cider Vinegar 188 MG CAPS Take by mouth.  . Ascorbic Acid (VITAMIN C) 100 MG tablet Take by mouth.  . B Complex Vitamins (VITAMIN B-COMPLEX) TABS Take by mouth.  Raelyn Ensign Pollen 1000 MG TABS Take by mouth.  . Bilberry 100 MG CAPS Take by mouth.  . Calcium Citrate 1040 MG TABS Take by mouth.  . Cinnamon 500 MG capsule Take by mouth.  . Coenzyme Q10 (COQ-10) 100 MG CAPS Take by mouth.  . Cranberry 200 MG CAPS Take by mouth.  . cyclobenzaprine (FLEXERIL) 10 MG tablet Take 1 tablet (10 mg total) by mouth 3  (three) times daily as needed. for muscle spams  . Echinacea 500 MG CAPS Take by mouth.  . etodolac (LODINE) 500 MG tablet One twice a day  . Flaxseed, Linseed, (FLAXSEED OIL PO) Take by mouth.  . Garlic 8127 MG CAPS Take by mouth.  . Glucosamine-Chondroitin (GLUCOSAMINE CHONDR COMPLEX PO) Take by mouth.  Marland Kitchen lisinopril (ZESTRIL) 20 MG tablet Take 1 tablet (20 mg total) by mouth daily.  . Multiple Vitamins-Minerals (MULTIVITAMIN WITH MINERALS) tablet Take 1 tablet by mouth daily.  . propranolol ER (INDERAL LA) 80 MG 24 hr capsule Take 1 capsule (80 mg total) by mouth daily.  Berniece Pap Cartilage 500 MG CAPS Take by mouth.  Marland Kitchen UNABLE TO FIND [employer] toxifier/regenerator herb  . [DISCONTINUED] Multiple Vitamins-Minerals (MULTIVITAMIN ADULT PO) Take by mouth.    PHQ 2/9 Scores 11/10/2019 06/09/2019 12/15/2018 09/04/2018  PHQ - 2 Score 0 0 0 0  PHQ- 9 Score 0 - 0 0    GAD 7 : Generalized Anxiety Score 11/10/2019 12/15/2018  Nervous, Anxious, on Edge 0 0  Control/stop worrying 0 0  Worry too much - different things 0 0  Trouble relaxing 0 0  Restless 0 0  Easily annoyed or irritable 0 0  Afraid - awful might happen 0 0  Total GAD 7 Score 0 0    BP Readings from Last 3 Encounters:  12/16/19 110/70  11/10/19 130/80  06/09/19 118/80    Physical Exam Vitals and nursing note reviewed.  HENT:     Head: Normocephalic.     Right Ear: External ear normal.     Left Ear: External ear normal.     Nose: Nose normal.     Mouth/Throat:     Mouth: Mucous membranes are moist.  Eyes:     General: No scleral icterus.       Right eye: No discharge.        Left eye: No discharge.     Extraocular Movements:     Right eye: Normal extraocular motion.     Left eye: Normal extraocular motion.     Conjunctiva/sclera: Conjunctivae normal.     Pupils: Pupils are equal, round, and reactive to light.  Neck:     Thyroid: No thyromegaly.     Vascular: No JVD.     Trachea: No tracheal deviation.   Cardiovascular:     Rate and Rhythm: Normal rate and regular rhythm.     Heart sounds: Normal heart sounds. No murmur heard.  No friction rub. No gallop.   Pulmonary:     Effort: No respiratory distress.     Breath sounds: Normal breath sounds. No wheezing or rales.  Abdominal:     General: Bowel sounds are normal.     Palpations: Abdomen is soft. There is no mass.     Tenderness: There is no abdominal tenderness. There is no guarding or rebound.  Musculoskeletal:        General: No tenderness. Normal range of motion.     Cervical back: Normal range of motion and neck supple.  Lymphadenopathy:     Cervical: No cervical adenopathy.  Skin:    General: Skin is warm.     Findings: No rash.  Neurological:     Mental Status: He is alert and oriented to person, place, and time.     Cranial Nerves: No cranial nerve deficit.     Deep Tendon Reflexes: Reflexes are normal and symmetric.     Wt Readings from Last 3 Encounters:  12/16/19 179 lb (81.2 kg)  11/10/19 182 lb (82.6 kg)  06/09/19 178 lb 12.8 oz (81.1 kg)    BP 110/70   Pulse 72   Ht 5\' 9"  (1.753 m)   Wt 179 lb (81.2 kg)   BMI 26.43 kg/m   Assessment and Plan:                                                      Patient's chart was reviewed for previous encounters.  Most recent labs and imaging were reviewed.  1. Essential hypertension Chronic.  Controlled.  Stable.  Blood pressure today is 110/70.  Continue amlodipine 2.5 mg once a day, lisinopril 20 mg once a day.  And propranolol ER 80 mg 1 daily as well.  Will check lipid panel and renal function panel. - amLODipine (NORVASC) 2.5 MG tablet; Take 1 tablet (2.5 mg total) by mouth daily.  Dispense: 90 tablet; Refill: 1 - lisinopril (ZESTRIL) 20 MG tablet; Take 1 tablet (20 mg total) by mouth  daily.  Dispense: 90 tablet; Refill: 1 - propranolol ER (INDERAL LA) 80 MG 24 hr capsule; Take 1 capsule (80 mg total) by mouth daily.  Dispense: 90 capsule; Refill: 1 - Lipid Panel  With LDL/HDL Ratio - Renal function panel  2. Degeneration, intervertebral disc, cervical Chronic.  Controlled.  Stable.  Continue cyclobenzaprine 10 mg 1 nightly as needed. - cyclobenzaprine (FLEXERIL) 10 MG tablet; Take 1 tablet (10 mg total) by mouth 3 (three) times daily as needed. for muscle spams  Dispense: 90 tablet; Refill: 1  3. Arthralgia of hip, unspecified laterality Chronic.  Controlled.  Stable.  Continue etodolac 1 twice a day as needed as needed arthralgia of hip. - etodolac (LODINE) 500 MG tablet; One twice a day  Dispense: 180 tablet; Refill: 1  4. Migraine without status migrainosus, not intractable, unspecified migraine type Chronic.  Controlled.  Stable.  Continue for prophylactic reasons propranolol ER 80 mg once a day. - propranolol ER (INDERAL LA) 80 MG 24 hr capsule; Take 1 capsule (80 mg total) by mouth daily.  Dispense: 90 capsule; Refill: 1

## 2019-12-17 LAB — LIPID PANEL WITH LDL/HDL RATIO
Cholesterol, Total: 185 mg/dL (ref 100–199)
HDL: 65 mg/dL (ref >39)
LDL Chol Calc (NIH): 107 mg/dL — ABNORMAL HIGH (ref 0–99)
LDL/HDL Ratio: 1.6 ratio (ref 0.0–3.6)
Triglycerides: 68 mg/dL (ref 0–149)
VLDL Cholesterol Cal: 13 mg/dL (ref 5–40)

## 2019-12-17 LAB — RENAL FUNCTION PANEL
Albumin: 4.6 g/dL (ref 3.8–4.8)
BUN/Creatinine Ratio: 11 (ref 10–24)
BUN: 11 mg/dL (ref 8–27)
CO2: 24 mmol/L (ref 20–29)
Calcium: 9.6 mg/dL (ref 8.6–10.2)
Chloride: 102 mmol/L (ref 96–106)
Creatinine, Ser: 1.02 mg/dL (ref 0.76–1.27)
GFR calc Af Amer: 86 mL/min/{1.73_m2} (ref >59)
GFR calc non Af Amer: 74 mL/min/{1.73_m2} (ref >59)
Glucose: 102 mg/dL — ABNORMAL HIGH (ref 65–99)
Phosphorus: 3.1 mg/dL (ref 2.8–4.1)
Potassium: 4.8 mmol/L (ref 3.5–5.2)
Sodium: 141 mmol/L (ref 134–144)

## 2020-01-10 DIAGNOSIS — H25813 Combined forms of age-related cataract, bilateral: Secondary | ICD-10-CM | POA: Diagnosis not present

## 2020-01-10 DIAGNOSIS — H02889 Meibomian gland dysfunction of unspecified eye, unspecified eyelid: Secondary | ICD-10-CM | POA: Diagnosis not present

## 2020-01-10 DIAGNOSIS — H18523 Epithelial (juvenile) corneal dystrophy, bilateral: Secondary | ICD-10-CM | POA: Diagnosis not present

## 2020-02-03 ENCOUNTER — Telehealth: Payer: Self-pay

## 2020-02-03 NOTE — Telephone Encounter (Signed)
Please call and sched appt for this

## 2020-02-03 NOTE — Telephone Encounter (Unsigned)
Copied from CRM [id number]. Topic: General - Other >> Feb 03, 2020  8:25 AM Tamela Oddi wrote: Reason for CRM: Patient called to inform the doctor that he believes he has another prostate infection and wants to know if he should schedule an appt. Or if he should get a script for the medication he was taking before for the same issue.  Please call patient to discuss.  CB# [phone]

## 2020-02-03 NOTE — Telephone Encounter (Signed)
Pt coming in 02/04/20

## 2020-02-04 ENCOUNTER — Ambulatory Visit (INDEPENDENT_AMBULATORY_CARE_PROVIDER_SITE_OTHER): Payer: Medicare Other | Admitting: Family Medicine

## 2020-02-04 ENCOUNTER — Other Ambulatory Visit: Payer: Self-pay

## 2020-02-04 ENCOUNTER — Encounter: Payer: Self-pay | Admitting: Family Medicine

## 2020-02-04 VITALS — BP 138/80 | HR 68 | Ht 69.0 in | Wt 182.0 lb

## 2020-02-04 DIAGNOSIS — R361 Hematospermia: Secondary | ICD-10-CM

## 2020-02-04 DIAGNOSIS — Z8546 Personal history of malignant neoplasm of prostate: Secondary | ICD-10-CM | POA: Diagnosis not present

## 2020-02-04 DIAGNOSIS — N529 Male erectile dysfunction, unspecified: Secondary | ICD-10-CM | POA: Diagnosis not present

## 2020-02-04 DIAGNOSIS — Z923 Personal history of irradiation: Secondary | ICD-10-CM | POA: Diagnosis not present

## 2020-02-04 DIAGNOSIS — R822 Biliuria: Secondary | ICD-10-CM | POA: Diagnosis not present

## 2020-02-04 DIAGNOSIS — Z08 Encounter for follow-up examination after completed treatment for malignant neoplasm: Secondary | ICD-10-CM | POA: Diagnosis not present

## 2020-02-04 DIAGNOSIS — Z9229 Personal history of other drug therapy: Secondary | ICD-10-CM | POA: Diagnosis not present

## 2020-02-04 DIAGNOSIS — C61 Malignant neoplasm of prostate: Secondary | ICD-10-CM | POA: Diagnosis not present

## 2020-02-04 LAB — POCT URINALYSIS DIPSTICK
Bilirubin, UA: POSITIVE
Blood, UA: NEGATIVE
Glucose, UA: NEGATIVE
Ketones, UA: NEGATIVE
Leukocytes, UA: NEGATIVE
Nitrite, UA: NEGATIVE
Protein, UA: NEGATIVE
Spec Grav, UA: 1.01 (ref 1.010–1.025)
Urobilinogen, UA: 0.2 E.U./dL
pH, UA: 7.5 (ref 5.0–8.0)

## 2020-02-04 LAB — PSA: PSA: 0.46

## 2020-02-04 MED ORDER — SULFAMETHOXAZOLE-TRIMETHOPRIM 800-160 MG PO TABS: 1.0000 | ORAL_TABLET | Freq: Two times a day (BID) | ORAL | 0 refills | Status: DC

## 2020-02-04 NOTE — Progress Notes (Signed)
Date:  02/04/2020   Name:  Marcus Gonzalez   DOB:  May 08, 1950   MRN:  [id number]   Chief Complaint: Follow-up [place] tint )  Urinary Tract Infection  This is a recurrent (hemospermia) problem. The problem has been waxing and waning. Pertinent negatives include no chills, discharge, flank pain, frequency, hematuria, hesitancy, nausea, sweats, urgency or vomiting. He has tried nothing for the symptoms.    Lab Results  Component Value Date   CREATININE 1.02 12/16/2019   BUN 11 12/16/2019   NA 141 12/16/2019   K 4.8 12/16/2019   CL 102 12/16/2019   CO2 24 12/16/2019   Lab Results  Component Value Date   CHOL 185 12/16/2019   HDL 65 12/16/2019   LDLCALC 107 (H) 12/16/2019   TRIG 68 12/16/2019   CHOLHDL 2.3 11/29/2016   No results found for: TSH No results found for: HGBA1C No results found for: WBC, HGB, HCT, MCV, PLT No results found for: ALT, AST, GGT, ALKPHOS, BILITOT   Review of Systems  Constitutional: Negative for chills and fever.  HENT: Negative for drooling, ear discharge, ear pain and sore throat.   Respiratory: Negative for cough, shortness of breath and wheezing.   Cardiovascular: Negative for chest pain, palpitations and leg swelling.  Gastrointestinal: Negative for abdominal pain, blood in stool, constipation, diarrhea, nausea and vomiting.  Endocrine: Negative for polydipsia.  Genitourinary: Negative for dysuria, flank pain, frequency, hematuria, hesitancy and urgency.  Musculoskeletal: Negative for back pain, myalgias and neck pain.  Skin: Negative for rash.  Allergic/Immunologic: Negative for environmental allergies.  Neurological: Negative for dizziness and headaches.  Hematological: Does not bruise/bleed easily.  Psychiatric/Behavioral: Negative for suicidal ideas. The patient is not nervous/anxious.     Patient Active Problem List   Diagnosis Date Noted  . Arthralgia of hip 06/03/2018  . 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 Encounter for screening  colonoscopy   . Benign neoplasm of ascending colon   . Polyp of sigmoid colon   . Rectal polyp   . 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 Hyperlipidemia 11/29/2016  . Degeneration, intervertebral disc, cervical 04/19/2016  . Migraine without status migrainosus, not intractable 04/19/2016  . Essential hypertension 04/19/2016  . Cephalalgia 05/24/2014  . Routine general medical examination at a health care facility 05/24/2014  . Anxiety attack 05/24/2014  . Arthritis 05/24/2014  . Benign prostatic hyperplasia without lower urinary tract symptoms 05/24/2014  . Narrowing of intervertebral disc space 05/24/2014  . History of migraine headaches 05/24/2014  . Personal history of other malignant neoplasm of skin 02/28/2012    Allergies  Allergen Reactions  . Cephalexin     Past Surgical History:  Procedure Laterality Date  . BASAL CELL CARCINOMA EXCISION     arm and leg  . COLONOSCOPY  2013   cleared for 5 yrs- Dr Allen Norris  . COLONOSCOPY WITH PROPOFOL N/A 08/22/2017   Procedure: COLONOSCOPY WITH PROPOFOL;  Surgeon: Lucilla Lame, MD;  Location: [facility];  Service: Endoscopy;  Laterality: N/A;  . POLYPECTOMY  08/22/2017   Procedure: POLYPECTOMY INTESTINAL;  Surgeon: Lucilla Lame, MD;  Location: [facility];  Service: Endoscopy;;  . skin ca removed      Social History   Tobacco Use  . Smoking status: Former Smoker    Packs/day: 1.00    Years: 29.00    Pack years: 29.00    Types: Cigarettes    Quit date: 1999    Years since quitting: 23.0  .  Smokeless tobacco: Never Used  . Tobacco comment: smoking cessation materials not required  Vaping Use  . Vaping Use: Never used  Substance Use Topics  . Alcohol use: Yes    Alcohol/week: 14.0 standard drinks    Types: 14 Glasses of wine per week  . Drug use: No     Medication list has been reviewed and updated.  Current Meds  Medication Sig  . amLODipine (NORVASC) 2.5 MG tablet Take 1 tablet (2.5 mg  total) by mouth daily.  Marland Kitchen Apple Cider Vinegar 188 MG CAPS Take by mouth.  . Ascorbic Acid (VITAMIN C) 100 MG tablet Take by mouth.  . B Complex Vitamins (VITAMIN B-COMPLEX) TABS Take by mouth.  Raelyn Ensign Pollen 1000 MG TABS Take by mouth.  . Bilberry 100 MG CAPS Take by mouth.  . Calcium Citrate 1040 MG TABS Take by mouth.  . Cinnamon 500 MG capsule Take by mouth.  . Coenzyme Q10 (COQ-10) 100 MG CAPS Take by mouth.  . Cranberry 200 MG CAPS Take by mouth.  . cyclobenzaprine (FLEXERIL) 10 MG tablet Take 1 tablet (10 mg total) by mouth 3 (three) times daily as needed. for muscle spams  . Echinacea 500 MG CAPS Take by mouth.  . etodolac (LODINE) 500 MG tablet One twice a day  . Flaxseed, Linseed, (FLAXSEED OIL PO) Take by mouth.  . Garlic 2130 MG CAPS Take by mouth.  . Glucosamine-Chondroitin (GLUCOSAMINE CHONDR COMPLEX PO) Take by mouth.  Marland Kitchen lisinopril (ZESTRIL) 20 MG tablet Take 1 tablet (20 mg total) by mouth daily.  . Multiple Vitamins-Minerals (MULTIVITAMIN WITH MINERALS) tablet Take 1 tablet by mouth daily.  . propranolol ER (INDERAL LA) 80 MG 24 hr capsule Take 1 capsule (80 mg total) by mouth daily.  Berniece Pap Cartilage 500 MG CAPS Take by mouth.  Marland Kitchen UNABLE TO FIND [employer] toxifier/regenerator herb    PHQ 2/9 Scores 11/10/2019 06/09/2019 12/15/2018 09/04/2018  PHQ - 2 Score 0 0 0 0  PHQ- 9 Score 0 - 0 0    GAD 7 : Generalized Anxiety Score 11/10/2019 12/15/2018  Nervous, Anxious, on Edge 0 0  Control/stop worrying 0 0  Worry too much - different things 0 0  Trouble relaxing 0 0  Restless 0 0  Easily annoyed or irritable 0 0  Afraid - awful might happen 0 0  Total GAD 7 Score 0 0    BP Readings from Last 3 Encounters:  02/04/20 138/80  12/16/19 110/70  11/10/19 130/80    Physical Exam Vitals and nursing note reviewed.  HENT:     Head: Normocephalic.     Right Ear: Tympanic membrane and external ear normal.     Left Ear: Tympanic membrane and external ear normal.     Nose:  Nose normal. No congestion or rhinorrhea.     Mouth/Throat:     Mouth: Oropharynx is clear and moist. Mucous membranes are dry.  Eyes:     General: No scleral icterus.       Right eye: No discharge.        Left eye: No discharge.     Extraocular Movements: EOM normal.     Conjunctiva/sclera: Conjunctivae normal.     Pupils: Pupils are equal, round, and reactive to light.  Neck:     Thyroid: No thyromegaly.     Vascular: No JVD.     Trachea: No tracheal deviation.  Cardiovascular:     Rate and Rhythm: Normal rate and regular rhythm.  Pulses: Intact distal pulses.     Heart sounds: Normal heart sounds. No murmur heard. No friction rub. No gallop.   Pulmonary:     Effort: No respiratory distress.     Breath sounds: Normal breath sounds. No wheezing, rhonchi or rales.  Abdominal:     General: Bowel sounds are normal. There is no distension.     Palpations: Abdomen is soft. There is no hepatosplenomegaly or mass.     Tenderness: There is no abdominal tenderness. There is no CVA tenderness, guarding or rebound.     Hernia: No hernia is present.  Genitourinary:    Prostate: Normal. Not enlarged, not tender and no nodules present.     Rectum: Normal. No mass.  Musculoskeletal:        General: No tenderness or edema. Normal range of motion.     Cervical back: Normal range of motion and neck supple.  Lymphadenopathy:     Cervical: No cervical adenopathy.  Skin:    General: Skin is warm.     Findings: No rash.  Neurological:     Mental Status: He is alert and oriented to person, place, and time.     Cranial Nerves: No cranial nerve deficit.     Motor: No weakness.     Coordination: Coordination normal.     Deep Tendon Reflexes: Strength normal and reflexes are normal and symmetric.     Wt Readings from Last 3 Encounters:  02/04/20 182 lb (82.6 kg)  12/16/19 179 lb (81.2 kg)  11/10/19 182 lb (82.6 kg)    BP 138/80   Pulse 68   Ht 5\' 9"  (1.753 m)   Wt 182 lb (82.6 kg)    BMI 26.88 kg/m    Assessment and Plan: 1. Hemospermia Acute.  Episodic.  Stable.  Patient had an episode which would suggest hemospermia.  We reviewed the differential diagnoses in this and in the event that this is an early infection we will initiate Septra DS 1 twice daily for 10 days. - POCT Urinalysis Dipstick - sulfamethoxazole-trimethoprim (BACTRIM DS) 800-160 MG tablet; Take 1 tablet by mouth 2 (two) times daily.  Dispense: 20 tablet; Refill: 0  2. Bilirubinuria It was noted on urinalysis that there is bilirubinuria and we will check hepatic function panel to assess for hepatic concerns. - Hepatic Function Panel (6)

## 2020-02-05 LAB — HEPATIC FUNCTION PANEL (6)
ALT: 26 IU/L (ref 0–44)
AST: 27 IU/L (ref 0–40)
Albumin: 4.3 g/dL (ref 3.8–4.8)
Alkaline Phosphatase: 71 IU/L (ref 44–121)
Bilirubin Total: 0.6 mg/dL (ref 0.0–1.2)
Bilirubin, Direct: 0.21 mg/dL (ref 0.00–0.40)

## 2020-02-22 DIAGNOSIS — H52222 Regular astigmatism, left eye: Secondary | ICD-10-CM | POA: Diagnosis not present

## 2020-02-22 DIAGNOSIS — H25812 Combined forms of age-related cataract, left eye: Secondary | ICD-10-CM | POA: Diagnosis not present

## 2020-02-22 DIAGNOSIS — H524 Presbyopia: Secondary | ICD-10-CM | POA: Diagnosis not present

## 2020-03-21 DIAGNOSIS — D2262 Melanocytic nevi of left upper limb, including shoulder: Secondary | ICD-10-CM | POA: Diagnosis not present

## 2020-03-21 DIAGNOSIS — D2261 Melanocytic nevi of right upper limb, including shoulder: Secondary | ICD-10-CM | POA: Diagnosis not present

## 2020-03-21 DIAGNOSIS — X32XXXA Exposure to sunlight, initial encounter: Secondary | ICD-10-CM | POA: Diagnosis not present

## 2020-03-21 DIAGNOSIS — Z85828 Personal history of other malignant neoplasm of skin: Secondary | ICD-10-CM | POA: Diagnosis not present

## 2020-03-21 DIAGNOSIS — D485 Neoplasm of uncertain behavior of skin: Secondary | ICD-10-CM | POA: Diagnosis not present

## 2020-03-21 DIAGNOSIS — L570 Actinic keratosis: Secondary | ICD-10-CM | POA: Diagnosis not present

## 2020-03-21 DIAGNOSIS — L821 Other seborrheic keratosis: Secondary | ICD-10-CM | POA: Diagnosis not present

## 2020-03-21 DIAGNOSIS — D2271 Melanocytic nevi of right lower limb, including hip: Secondary | ICD-10-CM | POA: Diagnosis not present

## 2020-03-27 ENCOUNTER — Telehealth: Payer: Self-pay

## 2020-03-27 NOTE — Telephone Encounter (Signed)
Spoke to pt

## 2020-03-27 NOTE — Telephone Encounter (Signed)
Copied from [place] [phone]. Topic: General - Other >> Mar 27, 2020 10:03 AM Marcus Gonzalez wrote: Reason for CRM: Pt has a question for Tara/ please advise

## 2020-03-28 DIAGNOSIS — H52221 Regular astigmatism, right eye: Secondary | ICD-10-CM | POA: Diagnosis not present

## 2020-03-28 DIAGNOSIS — H524 Presbyopia: Secondary | ICD-10-CM | POA: Diagnosis not present

## 2020-03-28 DIAGNOSIS — H25811 Combined forms of age-related cataract, right eye: Secondary | ICD-10-CM | POA: Diagnosis not present

## 2020-04-14 ENCOUNTER — Other Ambulatory Visit: Payer: Self-pay

## 2020-05-24 ENCOUNTER — Ambulatory Visit (INDEPENDENT_AMBULATORY_CARE_PROVIDER_SITE_OTHER): Payer: Medicare Other | Admitting: Family Medicine

## 2020-05-24 ENCOUNTER — Encounter: Payer: Self-pay | Admitting: Family Medicine

## 2020-05-24 ENCOUNTER — Other Ambulatory Visit: Payer: Self-pay

## 2020-05-24 VITALS — BP 148/100 | HR 84 | Ht 69.0 in | Wt 178.0 lb

## 2020-05-24 DIAGNOSIS — J0100 Acute maxillary sinusitis, unspecified: Secondary | ICD-10-CM

## 2020-05-24 MED ORDER — AZITHROMYCIN 250 MG PO TABS: ORAL_TABLET | ORAL | 1 refills | Status: AC

## 2020-05-24 NOTE — Progress Notes (Signed)
Date:  05/24/2020   Name:  Marcus Gonzalez   DOB:  [birth year]   MRN:  [id number]   Chief Complaint: Sinusitis  Sinusitis This is a new problem. The current episode started in the past 7 days. The problem has been waxing and waning since onset. The pain is mild. Associated symptoms include congestion and sinus pressure. Pertinent negatives include no chills, coughing, diaphoresis, ear pain, headaches, hoarse voice, neck pain, shortness of breath, sneezing, sore throat or swollen glands. Past treatments include nothing.    Lab Results  Component Value Date   CREATININE 1.02 12/16/2019   BUN 11 12/16/2019   NA 141 12/16/2019   K 4.8 12/16/2019   CL 102 12/16/2019   CO2 24 12/16/2019   Lab Results  Component Value Date   CHOL 185 12/16/2019   HDL 65 12/16/2019   LDLCALC 107 (H) 12/16/2019   TRIG 68 12/16/2019   CHOLHDL 2.3 11/29/2016   No results found for: TSH No results found for: HGBA1C No results found for: WBC, HGB, HCT, MCV, PLT Lab Results  Component Value Date   ALT 26 02/04/2020   AST 27 02/04/2020   ALKPHOS 71 02/04/2020   BILITOT 0.6 02/04/2020     Review of Systems  Constitutional: Negative for chills, diaphoresis and fever.  HENT: Positive for congestion and sinus pressure. Negative for drooling, ear discharge, ear pain, hoarse voice, sneezing and sore throat.   Respiratory: Negative for cough, shortness of breath and wheezing.   Cardiovascular: Negative for chest pain, palpitations and leg swelling.  Gastrointestinal: Negative for abdominal pain, blood in stool, constipation, diarrhea and nausea.  Endocrine: Negative for polydipsia.  Genitourinary: Negative for dysuria, frequency, hematuria and urgency.  Musculoskeletal: Negative for back pain, myalgias and neck pain.  Skin: Negative for rash.  Allergic/Immunologic: Negative for environmental allergies.  Neurological: Negative for dizziness and headaches.  Hematological: Does not bruise/bleed easily.   Psychiatric/Behavioral: Negative for suicidal ideas. The patient is not nervous/anxious.     Patient Active Problem List   Diagnosis Date Noted  . Arthralgia of hip 06/03/2018  . 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 Encounter for screening colonoscopy   . Benign neoplasm of ascending colon   . Polyp of sigmoid colon   . Rectal polyp   . 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 Hyperlipidemia 11/29/2016  . Degeneration, intervertebral disc, cervical 04/19/2016  . Migraine without status migrainosus, not intractable 04/19/2016  . Essential hypertension 04/19/2016  . Cephalalgia 05/24/2014  . Routine general medical examination at a health care facility 05/24/2014  . Anxiety attack 05/24/2014  . Arthritis 05/24/2014  . Benign prostatic hyperplasia without lower urinary tract symptoms 05/24/2014  . Narrowing of intervertebral disc space 05/24/2014  . History of migraine headaches 05/24/2014  . Personal history of other malignant neoplasm of skin 02/28/2012    Allergies  Allergen Reactions  . Cephalexin     Past Surgical History:  Procedure Laterality Date  . BASAL CELL CARCINOMA EXCISION     arm and leg  . COLONOSCOPY  2013   cleared for 5 yrs- Dr Allen Norris  . COLONOSCOPY WITH PROPOFOL N/A 08/22/2017   Procedure: COLONOSCOPY WITH PROPOFOL;  Surgeon: Lucilla Lame, MD;  Location: [facility];  Service: Endoscopy;  Laterality: N/A;  . POLYPECTOMY  08/22/2017   Procedure: POLYPECTOMY INTESTINAL;  Surgeon: Lucilla Lame, MD;  Location: [facility];  Service: Endoscopy;;  . skin ca removed      Social History  Tobacco Use  . Smoking status: Former Smoker    Packs/day: 1.00    Years: 29.00    Pack years: 29.00    Types: Cigarettes    Quit date: 1999    Years since quitting: 23.3  . Smokeless tobacco: Never Used  . Tobacco comment: smoking cessation materials not required  Vaping Use  . Vaping Use: Never used  Substance Use Topics  . Alcohol use: Yes     Alcohol/week: 14.0 standard drinks    Types: 14 Glasses of wine per week  . Drug use: No     Medication list has been reviewed and updated.  Current Meds  Medication Sig  . amLODipine (NORVASC) 2.5 MG tablet Take 1 tablet (2.5 mg total) by mouth daily.  Marland Kitchen Apple Cider Vinegar 188 MG CAPS Take by mouth.  . Ascorbic Acid (VITAMIN C) 100 MG tablet Take by mouth.  . B Complex Vitamins (VITAMIN B-COMPLEX) TABS Take by mouth.  Raelyn Ensign Pollen 1000 MG TABS Take by mouth.  . Bilberry 100 MG CAPS Take by mouth.  . Calcium Citrate 1040 MG TABS Take by mouth.  . Cinnamon 500 MG capsule Take by mouth.  . Coenzyme Q10 (COQ-10) 100 MG CAPS Take by mouth.  . Cranberry 200 MG CAPS Take by mouth.  . cyclobenzaprine (FLEXERIL) 10 MG tablet Take 1 tablet (10 mg total) by mouth 3 (three) times daily as needed. for muscle spams  . Echinacea 500 MG CAPS Take by mouth.  . etodolac (LODINE) 500 MG tablet One twice a day  . Flaxseed, Linseed, (FLAXSEED OIL PO) Take by mouth.  . Garlic 5361 MG CAPS Take by mouth.  . Glucosamine-Chondroitin (GLUCOSAMINE CHONDR COMPLEX PO) Take by mouth.  Marland Kitchen lisinopril (ZESTRIL) 20 MG tablet Take 1 tablet (20 mg total) by mouth daily.  . Multiple Vitamins-Minerals (MULTIVITAMIN WITH MINERALS) tablet Take 1 tablet by mouth daily.  . propranolol ER (INDERAL LA) 80 MG 24 hr capsule Take 1 capsule (80 mg total) by mouth daily.  Berniece Pap Cartilage 500 MG CAPS Take by mouth.  Marland Kitchen UNABLE TO FIND [employer] toxifier/regenerator herb    PHQ 2/9 Scores 11/10/2019 06/09/2019 12/15/2018 09/04/2018  PHQ - 2 Score 0 0 0 0  PHQ- 9 Score 0 - 0 0    GAD 7 : Generalized Anxiety Score 11/10/2019 12/15/2018  Nervous, Anxious, on Edge 0 0  Control/stop worrying 0 0  Worry too much - different things 0 0  Trouble relaxing 0 0  Restless 0 0  Easily annoyed or irritable 0 0  Afraid - awful might happen 0 0  Total GAD 7 Score 0 0    BP Readings from Last 3 Encounters:  05/24/20 (!) 148/100   02/04/20 138/80  12/16/19 110/70    Physical Exam Vitals and nursing note reviewed.  HENT:     Head: Normocephalic.     Right Ear: Tympanic membrane, ear canal and external ear normal.     Left Ear: Tympanic membrane, ear canal and external ear normal.     Nose: Nose normal. No congestion or rhinorrhea.  Eyes:     General: No scleral icterus.       Right eye: No discharge.        Left eye: No discharge.     Conjunctiva/sclera: Conjunctivae normal.     Pupils: Pupils are equal, round, and reactive to light.  Neck:     Thyroid: No thyromegaly.     Vascular: No JVD.  Trachea: No tracheal deviation.  Cardiovascular:     Rate and Rhythm: Normal rate and regular rhythm.     Heart sounds: Normal heart sounds. No murmur heard. No friction rub. No gallop.   Pulmonary:     Effort: No respiratory distress.     Breath sounds: Normal breath sounds. No wheezing, rhonchi or rales.  Abdominal:     General: Bowel sounds are normal.     Palpations: Abdomen is soft. There is no mass.     Tenderness: There is no abdominal tenderness. There is no guarding or rebound.  Musculoskeletal:        General: No tenderness. Normal range of motion.     Cervical back: Normal range of motion and neck supple.  Lymphadenopathy:     Cervical: No cervical adenopathy.  Skin:    General: Skin is warm.     Findings: No rash.  Neurological:     Mental Status: He is alert and oriented to person, place, and time.     Cranial Nerves: No cranial nerve deficit.     Deep Tendon Reflexes: Reflexes are normal and symmetric.     Wt Readings from Last 3 Encounters:  05/24/20 178 lb (80.7 kg)  02/04/20 182 lb (82.6 kg)  12/16/19 179 lb (81.2 kg)    BP (!) 148/100   Pulse 84   Ht 5\' 9"  (1.753 m)   Wt 178 lb (80.7 kg)   BMI 26.29 kg/m   Assessment and Plan:  1. Acute maxillary sinusitis, recurrence not specified New onset.  Persistent episodic.  Patient returns from trip initially with discomfort in  the sinus areas bilateral.  Exam and history is consistent with early sinusitis and we will treat with azithromycin to 50 mg 2 today followed by 1 a day for 4 days. - azithromycin (ZITHROMAX) 250 MG tablet; Take 2 tablets on day 1, then 1 tablet daily on days 2 through 5  Dispense: 6 tablet; Refill: 1

## 2020-06-13 ENCOUNTER — Other Ambulatory Visit: Payer: Self-pay | Admitting: Family Medicine

## 2020-06-13 DIAGNOSIS — I10 Essential (primary) hypertension: Secondary | ICD-10-CM

## 2020-06-13 NOTE — Telephone Encounter (Signed)
Future visit in 3 days  

## 2020-06-14 ENCOUNTER — Other Ambulatory Visit: Payer: Self-pay

## 2020-06-14 ENCOUNTER — Ambulatory Visit (INDEPENDENT_AMBULATORY_CARE_PROVIDER_SITE_OTHER): Payer: Medicare Other

## 2020-06-14 VITALS — BP 120/80 | HR 67 | Temp 97.9°F | Resp 16 | Ht 69.0 in | Wt 177.8 lb

## 2020-06-14 DIAGNOSIS — Z0000 Encounter for general adult medical examination without abnormal findings: Secondary | ICD-10-CM

## 2020-06-14 NOTE — Progress Notes (Addendum)
Subjective:   Marcus Gonzalez is a 70 y.o. male who presents for Medicare Annual/Subsequent preventive examination.  Review of Systems     Cardiac Risk Factors include: advanced age (>73men, >62 women);male gender;hypertension     Objective:    Today's Vitals   06/14/20 0819  BP: 120/80  Pulse: 67  Resp: 16  Temp: 97.9 F (36.6 C)  TempSrc: Oral  SpO2: 97%  Weight: 177 lb 12.8 oz (80.6 kg)  Height: 5\' 9"  (1.753 m)   Body mass index is 26.26 kg/m.  Advanced Directives 06/14/2020 06/03/2018 12/05/2017 08/22/2017 05/28/2017 10/07/2014  Does Patient Have a Medical Advance Directive? Yes Yes Yes Yes Yes Yes  Type of Paramedic of [place];Living will Living will;Healthcare Power of [place];Living will [place];Living will [place];Living will [place];Living will  Does patient want to make changes to medical advance directive? - - No - Patient declined No - Patient declined - -  Copy of [place] in Chart? No - copy requested No - copy requested No - copy requested No - copy requested No - copy requested No - copy requested    Current Medications (verified) Outpatient Encounter Medications as of 06/14/2020  Medication Sig  . amLODipine (NORVASC) 2.5 MG tablet TAKE 1 TABLET BY MOUTH DAILY  . Apple Cider Vinegar 188 MG CAPS Take by mouth.  . Ascorbic Acid (VITAMIN C) 100 MG tablet Take by mouth.  . B Complex Vitamins (VITAMIN B-COMPLEX) TABS Take by mouth.  Raelyn Ensign Pollen 1000 MG TABS Take by mouth.  . Bilberry 100 MG CAPS Take by mouth.  . Calcium Citrate 1040 MG TABS Take by mouth.  . Cinnamon 500 MG capsule Take by mouth.  . Coenzyme Q10 (COQ-10) 100 MG CAPS Take by mouth.  . Cranberry 200 MG CAPS Take by mouth.  . cyclobenzaprine (FLEXERIL) 10 MG tablet Take 1 tablet (10 mg total) by mouth 3 (three) times daily as needed. for muscle spams  . Echinacea  500 MG CAPS Take by mouth.  . etodolac (LODINE) 500 MG tablet One twice a day  . Flaxseed, Linseed, (FLAXSEED OIL PO) Take by mouth.  . Garlic 9242 MG CAPS Take by mouth.  . Glucosamine-Chondroitin (GLUCOSAMINE CHONDR COMPLEX PO) Take by mouth.  Marland Kitchen lisinopril (ZESTRIL) 20 MG tablet TAKE 1 TABLET BY MOUTH DAILY  . Multiple Vitamins-Minerals (MULTIVITAMIN WITH MINERALS) tablet Take 1 tablet by mouth daily.  . propranolol ER (INDERAL LA) 80 MG 24 hr capsule Take 1 capsule (80 mg total) by mouth daily.  Berniece Pap Cartilage 500 MG CAPS Take by mouth.  Marland Kitchen UNABLE TO FIND [place] toxifier/regenerator herb   No facility-administered encounter medications on file as of 06/14/2020.    Allergies (verified) Cephalexin   History: Past Medical History:  Diagnosis Date  . BPH (benign prostatic hyperplasia)   . Degenerative disc disease, cervical   . Headache   . Hip pain   . Migraine   . Prostate cancer [facility])    Past Surgical History:  Procedure Laterality Date  . BASAL CELL CARCINOMA EXCISION     arm and leg  . COLONOSCOPY  2013   cleared for 5 yrs- Dr Allen Norris  . COLONOSCOPY WITH PROPOFOL N/A 08/22/2017   Procedure: COLONOSCOPY WITH PROPOFOL;  Surgeon: Lucilla Lame, MD;  Location: [place];  Service: Endoscopy;  Laterality: N/A;  . POLYPECTOMY  08/22/2017   Procedure: POLYPECTOMY INTESTINAL;  Surgeon: Lucilla Lame, MD;  Location: [place];  Service: Endoscopy;;  . skin ca removed     Family History  Problem Relation Age of Onset  . Heart disease Father   . Diabetes Maternal Aunt   . Cancer Maternal Grandmother        colon cancer  . Heart disease Mother   . Dementia Mother   . Other Brother        "bladder issues"   Social History   Socioeconomic History  . Marital status: Single    Spouse name: Not on file  . Number of children: 0  . Years of education: Not on file  . Highest education level: Doctorate  Occupational History    Employer: [employer]   Tobacco Use   . Smoking status: Former Smoker    Packs/day: 1.00    Years: 29.00    Pack years: 29.00    Types: Cigarettes    Quit date: 1999    Years since quitting: 23.3  . Smokeless tobacco: Never Used  . Tobacco comment: smoking cessation materials not required  Vaping Use  . Vaping Use: Never used  Substance and Sexual Activity  . Alcohol use: Yes    Alcohol/week: 14.0 standard drinks    Types: 14 Glasses of wine per week  . Drug use: No  . Sexual activity: Not Currently  Other Topics Concern  . Not on file  Social History Narrative  . Not on file   Social Determinants of Health   Financial Resource Strain: Low Risk   . Difficulty of Paying Living Expenses: Not hard at all  Food Insecurity: No Food Insecurity  . Worried About Charity fundraiser in the Last Year: Never true  . Ran Out of Food in the Last Year: Never true  Transportation Needs: No Transportation Needs  . Lack of Transportation (Medical): No  . Lack of Transportation (Non-Medical): No  Physical Activity: Inactive  . Days of Exercise per Week: 0 days  . Minutes of Exercise per Session: 0 min  Stress: No Stress Concern Present  . Feeling of Stress : Not at all  Social Connections: Moderately Isolated  . Frequency of Communication with Friends and Family: More than three times a week  . Frequency of Social Gatherings with Friends and Family: More than three times a week  . Attends Religious Services: More than 4 times per year  . Active Member of Clubs or Organizations: No  . Attends Archivist Meetings: Never  . Marital Status: Never married    Tobacco Counseling Counseling given: Not Answered Comment: smoking cessation materials not required   Clinical Intake:  Pre-visit preparation completed: Yes  Pain : No/denies pain     BMI - recorded: 26.26 Nutritional Status: BMI 25 -29 Overweight Nutritional Risks: None Diabetes: No  How often do you need to have someone help you when you read  instructions, pamphlets, or other written materials from your doctor or pharmacy?: 1 - Never    Interpreter Needed?: No  Information entered by :: Clemetine Marker LPN   Activities of Daily Living In your present state of health, do you have any difficulty performing the following activities: 06/14/2020  Hearing? N  Comment declines hearing aids  Vision? N  Difficulty concentrating or making decisions? N  Walking or climbing stairs? N  Dressing or bathing? N  Doing errands, shopping? N  Preparing Food and eating ? N  Using the Toilet? N  In the past  six months, have you accidently leaked urine? N  Do you have problems with loss of bowel control? N  Managing your Medications? N  Managing your Finances? N  Housekeeping or managing your Housekeeping? N  Some recent data might be hidden    Patient Care Team: [name], MD as PCP - General (Family Medicine) Dasher, Cliffton Asters, MD as Consulting Physician (Dermatology) Adriana Simas Almyra Deforest, MD as Consulting Physician (Dermatology) Gwyneth Revels, DPM as Referring Physician (Podiatry)  Indicate any recent Medical Services you may have received from other than [facility] providers in the past year (date may be approximate).     Assessment:   This is a routine wellness examination for [place].  Hearing/Vision screen  Hearing Screening   125Hz  250Hz  500Hz  1000Hz  2000Hz  3000Hz  4000Hz  6000Hz  8000Hz   Right ear:           Left ear:           Comments:  Pt denies hearing difficulty  Vision Screening Comments: Annual eye exams with Dr. ; due for exam  Dietary issues and exercise activities discussed: Current Exercise Habits: The patient does not participate in regular exercise at present, Exercise limited by: None identified  Goals Addressed            This Visit's Progress   . DIET - INCREASE WATER INTAKE   On track    Recommend to drink at least 6-8 8oz glasses of water per day.      Depression Screen PHQ 2/9 Scores  06/14/2020 11/10/2019 06/09/2019 12/15/2018 09/04/2018 06/03/2018 12/05/2017  PHQ - 2 Score 0 0 0 0 0 0 0  PHQ- 9 Score - 0 - 0 0 - 0    Fall Risk Fall Risk  06/14/2020 11/10/2019 06/09/2019 06/03/2018 12/05/2017  Falls in the past year? 0 0 0 1 0  Number falls in past yr: 0 - 0 1 -  Injury with Fall? 0 - 0 1 -  Comment - - - broken bone in foot 12/2017 -  Risk for fall due to : No Fall Risks - No Fall Risks - -  Follow up Falls prevention discussed Falls evaluation completed Falls prevention discussed Falls prevention discussed Falls evaluation completed    FALL RISK PREVENTION PERTAINING TO THE HOME:  Any stairs in or around the home? Yes  If so, are there any without handrails? No  Home free of loose throw rugs in walkways, pet beds, electrical cords, etc? Yes  Adequate lighting in your home to reduce risk of falls? Yes   ASSISTIVE DEVICES UTILIZED TO PREVENT FALLS:  Life alert? No  Use of a cane, walker or w/c? No  Grab bars in the bathroom? No  Shower chair or bench in shower? No  Elevated toilet seat or a handicapped toilet? No   TIMED UP AND GO:  Was the test performed? Yes .  Length of time to ambulate 10 feet: 4 sec.   Gait steady and fast without use of assistive device  Cognitive Function: Normal cognitive status assessed by direct observation by this Nurse Health Advisor. No abnormalities found.       6CIT Screen 06/03/2018 12/05/2017 05/28/2017  What Year? 0 points 0 points 0 points  What month? 0 points 0 points 0 points  What time? 0 points 0 points 0 points  Count back from 20 0 points 0 points 0 points  Months in reverse 0 points 0 points 0 points  Repeat phrase 0 points 0 points 0 points  Total Score 0 0 0    Immunizations Immunization History  Administered Date(s) Administered  . Fluad Quad(high Dose 65+) 11/05/2018, 11/10/2019  . Influenza, High Dose Seasonal PF 11/29/2016  . Influenza, Seasonal, Injecte, Preservative Fre 11/06/2010  . Influenza,inj,Quad  PF,6+ Mos 11/23/2012, 10/29/2013, 11/22/2014, 11/13/2015  . Influenza-Unspecified 10/29/2013, 10/28/2016, 11/28/2017  . PFIZER(Purple Top)SARS-COV-2 Vaccination 02/22/2019, 03/15/2019, 11/01/2019, 06/09/2020  . Pneumococcal Conjugate-13 05/28/2017  . Pneumococcal Polysaccharide-23 06/03/2018  . Tdap 04/03/2010  . Zoster Recombinat (Shingrix) 12/26/2018, 04/10/2019    TDAP status: Due, Education has been provided regarding the importance of this vaccine. Advised may receive this vaccine at local pharmacy or Health Dept. Aware to provide a copy of the vaccination record if obtained from local pharmacy or Health Dept. Verbalized acceptance and understanding.  Flu Vaccine status: Up to date  Pneumococcal vaccine status: Up to date  Covid-19 vaccine status: Completed vaccines  Qualifies for Shingles Vaccine? Yes   Zostavax completed Yes   Shingrix Completed?: Yes  Screening Tests Health Maintenance  Topic Date Due  . TETANUS/TDAP  05/24/2021 (Originally 04/02/2020)  . INFLUENZA VACCINE  08/28/2020  . COLONOSCOPY (Pts 45-85yrs Insurance coverage will need to be confirmed)  08/23/2022  . COVID-19 Vaccine  Completed  . PNA vac Low Risk Adult  Completed  . HPV VACCINES  Aged Out  . Hepatitis C Screening  Discontinued    Health Maintenance  There are no preventive care reminders to display for this patient.  Colorectal cancer screening: Type of screening: Colonoscopy. Completed 08/22/17. Repeat every 5 years  Lung Cancer Screening: (Low Dose CT Chest recommended if Age 51-80 years, 30 pack-year currently smoking OR have quit w/in 15years.) does not qualify.   Additional Screening:  Hepatitis C Screening: does qualify; postponed  Vision Screening: Recommended annual ophthalmology exams for early detection of glaucoma and other disorders of the eye. Is the patient up to date with their annual eye exam?  Yes  Who is the provider or what is the name of the office in which the patient  attends annual eye exams? Dr. Mallie Mussel / [facility]  Dental Screening: Recommended annual dental exams for proper oral hygiene  Community Resource Referral / Chronic Care Management: CRR required this visit?  No   CCM required this visit?  No      Plan:     I have personally reviewed and noted the following in the patient's chart:   . Medical and social history . Use of alcohol, tobacco or illicit drugs  . Current medications and supplements including opioid prescriptions. Patient is not currently taking opioid prescriptions. . Functional ability and status . Nutritional status . Physical activity . Advanced directives . List of other physicians . Hospitalizations, surgeries, and ER visits in previous 12 months . Vitals . Screenings to include cognitive, depression, and falls . Referrals and appointments  In addition, I have reviewed and discussed with patient certain preventive protocols, quality metrics, and best practice recommendations. A written personalized care plan for preventive services as well as general preventive health recommendations were provided to patient.     Clemetine Marker, LPN   [id number]   Nurse Notes: none

## 2020-06-14 NOTE — Patient Instructions (Signed)
Mr. Marcus Gonzalez , Thank you for taking time to come for your Medicare Wellness Visit. I appreciate your ongoing commitment to your health goals. Please review the following plan we discussed and let me know if I can assist you in the future.   Screening recommendations/referrals: Colonoscopy: home 08/22/17. Repeat in 2024.  Recommended yearly ophthalmology/optometry visit for glaucoma screening and checkup Recommended yearly dental visit for hygiene and checkup  Vaccinations: Influenza vaccine: done 11/10/19 Pneumococcal vaccine: done 06/03/18 Tdap vaccine: due Shingles vaccine: done 12/26/18 & 04/10/19    Covid-19: done 02/22/19, 03/15/19, 11/01/19 & 06/09/20  Advanced directives: Please bring a copy of your health care power of attorney and living will to the office at your convenience.  Conditions/risks identified: Recommend increasing physical activity to at least 3 days per week   Next appointment: Follow up in one year for your annual wellness visit.   Preventive Care 35 Years and Older, Male Preventive care refers to lifestyle choices and visits with your health care provider that can promote health and wellness. What does preventive care include?  A yearly physical exam. This is also called an annual well check.  Dental exams once or twice a year.  Routine eye exams. Ask your health care provider how often you should have your eyes checked.  Personal lifestyle choices, including:  Daily care of your teeth and gums.  Regular physical activity.  Eating a healthy diet.  Avoiding tobacco and drug use.  Limiting alcohol use.  Practicing safe sex.  Taking low doses of aspirin every day.  Taking vitamin and mineral supplements as recommended by your health care provider. What happens during an annual well check? The services and screenings done by your health care provider during your annual well check will depend on your age, overall health, lifestyle risk factors, and family  history of disease. Counseling  Your health care provider may ask you questions about your:  Alcohol use.  Tobacco use.  Drug use.  Emotional well-being.  Home and relationship well-being.  Sexual activity.  Eating habits.  History of falls.  Memory and ability to understand (cognition).  Work and work Statistician. Screening  You may have the following tests or measurements:  Height, weight, and BMI.  Blood pressure.  Lipid and cholesterol levels. These may be checked every 5 years, or more frequently if you are over 28 years old.  Skin check.  Lung cancer screening. You may have this screening every year starting at age 66 if you have a 30-pack-year history of smoking and currently smoke or have quit within the past 15 years.  Fecal occult blood test (FOBT) of the stool. You may have this test every year starting at age 81.  Flexible sigmoidoscopy or colonoscopy. You may have a sigmoidoscopy every 5 years or a colonoscopy every 10 years starting at age 55.  Prostate cancer screening. Recommendations will vary depending on your family history and other risks.  Hepatitis C blood test.  Hepatitis B blood test.  Sexually transmitted disease (STD) testing.  Diabetes screening. This is done by checking your blood sugar (glucose) after you have not eaten for a while (fasting). You may have this done every 1-3 years.  Abdominal aortic aneurysm (AAA) screening. You may need this if you are a current or former smoker.  Osteoporosis. You may be screened starting at age 74 if you are at high risk. Talk with your health care provider about your test results, treatment options, and if necessary, the need for  more tests. Vaccines  Your health care provider may recommend certain vaccines, such as:  Influenza vaccine. This is recommended every year.  Tetanus, diphtheria, and acellular pertussis (Tdap, Td) vaccine. You may need a Td booster every 10 years.  Zoster vaccine.  You may need this after age 48.  Pneumococcal 13-valent conjugate (PCV13) vaccine. One dose is recommended after age 34.  Pneumococcal polysaccharide (PPSV23) vaccine. One dose is recommended after age 28. Talk to your health care provider about which screenings and vaccines you need and how often you need them. This information is not intended to replace advice given to you by your health care provider. Make sure you discuss any questions you have with your health care provider. Document Released: 02/10/2015 Document Revised: 10/04/2015 Document Reviewed: 11/15/2014 Elsevier Interactive Patient Education  2017 [place] Prevention in the Home Falls can cause injuries. They can happen to people of all ages. There are many things you can do to make your home safe and to help prevent falls. What can I do on the outside of my home?  Regularly fix the edges of walkways and driveways and fix any cracks.  Remove anything that might make you trip as you walk through a door, such as a raised step or threshold.  Trim any bushes or trees on the path to your home.  Use bright outdoor lighting.  Clear any walking paths of anything that might make someone trip, such as rocks or tools.  Regularly check to see if handrails are loose or broken. Make sure that both sides of any steps have handrails.  Any raised decks and porches should have guardrails on the edges.  Have any leaves, snow, or ice cleared regularly.  Use sand or salt on walking paths during winter.  Clean up any spills in your garage right away. This includes oil or grease spills. What can I do in the bathroom?  Use night lights.  Install grab bars by the toilet and in the tub and shower. Do not use towel bars as grab bars.  Use non-skid mats or decals in the tub or shower.  If you need to sit down in the shower, use a plastic, non-slip stool.  Keep the floor dry. Clean up any water that spills on the floor as soon  as it happens.  Remove soap buildup in the tub or shower regularly.  Attach bath mats securely with double-sided non-slip rug tape.  Do not have throw rugs and other things on the floor that can make you trip. What can I do in the bedroom?  Use night lights.  Make sure that you have a light by your bed that is easy to reach.  Do not use any sheets or blankets that are too big for your bed. They should not hang down onto the floor.  Have a firm chair that has side arms. You can use this for support while you get dressed.  Do not have throw rugs and other things on the floor that can make you trip. What can I do in the kitchen?  Clean up any spills right away.  Avoid walking on wet floors.  Keep items that you use a lot in easy-to-reach places.  If you need to reach something above you, use a strong step stool that has a grab bar.  Keep electrical cords out of the way.  Do not use floor polish or wax that makes floors slippery. If you must use wax, use non-skid  floor wax.  Do not have throw rugs and other things on the floor that can make you trip. What can I do with my stairs?  Do not leave any items on the stairs.  Make sure that there are handrails on both sides of the stairs and use them. Fix handrails that are broken or loose. Make sure that handrails are as long as the stairways.  Check any carpeting to make sure that it is firmly attached to the stairs. Fix any carpet that is loose or worn.  Avoid having throw rugs at the top or bottom of the stairs. If you do have throw rugs, attach them to the floor with carpet tape.  Make sure that you have a light switch at the top of the stairs and the bottom of the stairs. If you do not have them, ask someone to add them for you. What else can I do to help prevent falls?  Wear shoes that:  Do not have high heels.  Have rubber bottoms.  Are comfortable and fit you well.  Are closed at the toe. Do not wear sandals.  If  you use a stepladder:  Make sure that it is fully opened. Do not climb a closed stepladder.  Make sure that both sides of the stepladder are locked into place.  Ask someone to hold it for you, if possible.  Clearly mark and make sure that you can see:  Any grab bars or handrails.  First and last steps.  Where the edge of each step is.  Use tools that help you move around (mobility aids) if they are needed. These include:  Canes.  Walkers.  Scooters.  Crutches.  Turn on the lights when you go into a dark area. Replace any light bulbs as soon as they burn out.  Set up your furniture so you have a clear path. Avoid moving your furniture around.  If any of your floors are uneven, fix them.  If there are any pets around you, be aware of where they are.  Review your medicines with your doctor. Some medicines can make you feel dizzy. This can increase your chance of falling. Ask your doctor what other things that you can do to help prevent falls. This information is not intended to replace advice given to you by your health care provider. Make sure you discuss any questions you have with your health care provider. Document Released: 11/10/2008 Document Revised: 06/22/2015 Document Reviewed: 02/18/2014 Elsevier Interactive Patient Education  2017 [employer].

## 2020-06-16 ENCOUNTER — Other Ambulatory Visit: Payer: Self-pay

## 2020-06-16 ENCOUNTER — Encounter: Payer: Self-pay | Admitting: Family Medicine

## 2020-06-16 ENCOUNTER — Ambulatory Visit (INDEPENDENT_AMBULATORY_CARE_PROVIDER_SITE_OTHER): Payer: Medicare Other | Admitting: Family Medicine

## 2020-06-16 VITALS — BP 120/80 | HR 76 | Ht 69.0 in | Wt 175.0 lb

## 2020-06-16 DIAGNOSIS — I10 Essential (primary) hypertension: Secondary | ICD-10-CM | POA: Diagnosis not present

## 2020-06-16 DIAGNOSIS — M25559 Pain in unspecified hip: Secondary | ICD-10-CM | POA: Diagnosis not present

## 2020-06-16 DIAGNOSIS — G43909 Migraine, unspecified, not intractable, without status migrainosus: Secondary | ICD-10-CM | POA: Diagnosis not present

## 2020-06-16 DIAGNOSIS — M5030 Other cervical disc degeneration, unspecified cervical region: Secondary | ICD-10-CM | POA: Diagnosis not present

## 2020-06-16 MED ORDER — ETODOLAC 500 MG PO TABS: ORAL_TABLET | ORAL | 1 refills | Status: DC

## 2020-06-16 MED ORDER — CYCLOBENZAPRINE HCL 10 MG PO TABS: 10.0000 mg | ORAL_TABLET | Freq: Three times a day (TID) | ORAL | 1 refills | Status: DC | PRN

## 2020-06-16 MED ORDER — PROPRANOLOL HCL ER 80 MG PO CP24: 80.0000 mg | ORAL_CAPSULE | Freq: Every day | ORAL | 1 refills | Status: DC

## 2020-06-16 NOTE — Progress Notes (Signed)
Date:  06/16/2020   Name:  Marcus Gonzalez   DOB:  09-01-1950   MRN:  [id number]   Chief Complaint: Hypertension, Headache, and Neck Pain  Hypertension This is a chronic problem. The current episode started more than 1 year ago. The problem has been gradually improving since onset. The problem is controlled. Pertinent negatives include no anxiety, blurred vision, chest pain, headaches, malaise/fatigue, neck pain, orthopnea, palpitations, peripheral edema, PND, shortness of breath or sweats. There are no associated agents to hypertension. There are no known risk factors for coronary artery disease. Past treatments include beta blockers. The current treatment provides moderate improvement. There are no compliance problems.  There is no history of angina, kidney disease, CAD/MI, CVA, heart failure, left ventricular hypertrophy or PVD. There is no history of chronic renal disease, a hypertension causing med or renovascular disease.  Headache  This is a chronic problem. The current episode started more than 1 year ago. The problem has been resolved. Pertinent negatives include no abdominal pain, back pain, blurred vision, coughing, dizziness, ear pain, fever, nausea, neck pain or sore throat. He has tried beta blockers for the symptoms. The treatment provided moderate relief. His past medical history is significant for hypertension.  Neck Pain  This is a chronic problem. The current episode started more than 1 year ago. The problem occurs daily. The pain is associated with nothing. Pertinent negatives include no chest pain, fever or headaches.    Lab Results  Component Value Date   CREATININE 1.02 12/16/2019   BUN 11 12/16/2019   NA 141 12/16/2019   K 4.8 12/16/2019   CL 102 12/16/2019   CO2 24 12/16/2019   Lab Results  Component Value Date   CHOL 185 12/16/2019   HDL 65 12/16/2019   LDLCALC 107 (H) 12/16/2019   TRIG 68 12/16/2019   CHOLHDL 2.3 11/29/2016   No results found for: TSH No  results found for: HGBA1C No results found for: WBC, HGB, HCT, MCV, PLT Lab Results  Component Value Date   ALT 26 02/04/2020   AST 27 02/04/2020   ALKPHOS 71 02/04/2020   BILITOT 0.6 02/04/2020     Review of Systems  Constitutional: Negative for chills, fever and malaise/fatigue.  HENT: Negative for drooling, ear discharge, ear pain and sore throat.   Eyes: Negative for blurred vision.  Respiratory: Negative for cough, shortness of breath and wheezing.   Cardiovascular: Negative for chest pain, palpitations, orthopnea, leg swelling and PND.  Gastrointestinal: Negative for abdominal pain, blood in stool, constipation, diarrhea and nausea.  Endocrine: Negative for polydipsia.  Genitourinary: Negative for dysuria, frequency, hematuria and urgency.  Musculoskeletal: Negative for back pain, myalgias and neck pain.  Skin: Negative for rash.  Allergic/Immunologic: Negative for environmental allergies.  Neurological: Negative for dizziness and headaches.  Hematological: Does not bruise/bleed easily.  Psychiatric/Behavioral: Negative for suicidal ideas. The patient is not nervous/anxious.     Patient Active Problem List   Diagnosis Date Noted  . Arthralgia of hip 06/03/2018  . 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 Encounter for screening colonoscopy   . Benign neoplasm of ascending colon   . Polyp of sigmoid colon   . Rectal polyp   . 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 Hyperlipidemia 11/29/2016  . Degeneration, intervertebral disc, cervical 04/19/2016  . Migraine without status migrainosus, not intractable 04/19/2016  . Essential hypertension 04/19/2016  . Cephalalgia 05/24/2014  . Routine general medical examination at a health care facility 05/24/2014  .  Anxiety attack 05/24/2014  . Arthritis 05/24/2014  . Benign prostatic hyperplasia without lower urinary tract symptoms 05/24/2014  . Narrowing of intervertebral disc space 05/24/2014  . History of migraine  headaches 05/24/2014  . Personal history of other malignant neoplasm of skin 02/28/2012    Allergies  Allergen Reactions  . Cephalexin     Past Surgical History:  Procedure Laterality Date  . BASAL CELL CARCINOMA EXCISION     arm and leg  . COLONOSCOPY  2013   cleared for 5 yrs- Dr Allen Norris  . COLONOSCOPY WITH PROPOFOL N/A 08/22/2017   Procedure: COLONOSCOPY WITH PROPOFOL;  Surgeon: Lucilla Lame, MD;  Location: [facility];  Service: Endoscopy;  Laterality: N/A;  . POLYPECTOMY  08/22/2017   Procedure: POLYPECTOMY INTESTINAL;  Surgeon: Lucilla Lame, MD;  Location: [facility];  Service: Endoscopy;;  . skin ca removed      Social History   Tobacco Use  . Smoking status: Former Smoker    Packs/day: 1.00    Years: 29.00    Pack years: 29.00    Types: Cigarettes    Quit date: 1999    Years since quitting: 23.3  . Smokeless tobacco: Never Used  . Tobacco comment: smoking cessation materials not required  Vaping Use  . Vaping Use: Never used  Substance Use Topics  . Alcohol use: Yes    Alcohol/week: 14.0 standard drinks    Types: 14 Glasses of wine per week  . Drug use: No     Medication list has been reviewed and updated.  Current Meds  Medication Sig  . amLODipine (NORVASC) 2.5 MG tablet TAKE 1 TABLET BY MOUTH DAILY  . Apple Cider Vinegar 188 MG CAPS Take by mouth.  . Ascorbic Acid (VITAMIN C) 100 MG tablet Take by mouth.  . B Complex Vitamins (VITAMIN B-COMPLEX) TABS Take by mouth.  Raelyn Ensign Pollen 1000 MG TABS Take by mouth.  . Bilberry 100 MG CAPS Take by mouth.  . Calcium Citrate 1040 MG TABS Take by mouth.  . Cinnamon 500 MG capsule Take by mouth.  . Coenzyme Q10 (COQ-10) 100 MG CAPS Take by mouth.  . Cranberry 200 MG CAPS Take by mouth.  . cyclobenzaprine (FLEXERIL) 10 MG tablet Take 1 tablet (10 mg total) by mouth 3 (three) times daily as needed. for muscle spams  . Echinacea 500 MG CAPS Take by mouth.  . etodolac (LODINE) 500 MG tablet One twice a  day  . Flaxseed, Linseed, (FLAXSEED OIL PO) Take by mouth.  . Garlic 1829 MG CAPS Take by mouth.  . Glucosamine-Chondroitin (GLUCOSAMINE CHONDR COMPLEX PO) Take by mouth.  Marland Kitchen lisinopril (ZESTRIL) 20 MG tablet TAKE 1 TABLET BY MOUTH DAILY  . Multiple Vitamins-Minerals (MULTIVITAMIN WITH MINERALS) tablet Take 1 tablet by mouth daily.  . propranolol ER (INDERAL LA) 80 MG 24 hr capsule Take 1 capsule (80 mg total) by mouth daily.  Berniece Pap Cartilage 500 MG CAPS Take by mouth.  Marland Kitchen UNABLE TO FIND [employer] toxifier/regenerator herb    PHQ 2/9 Scores 06/16/2020 06/14/2020 11/10/2019 06/09/2019  PHQ - 2 Score 0 0 0 0  PHQ- 9 Score 0 - 0 -    GAD 7 : Generalized Anxiety Score 06/16/2020 11/10/2019 12/15/2018  Nervous, Anxious, on Edge 0 0 0  Control/stop worrying 0 0 0  Worry too much - different things 0 0 0  Trouble relaxing 0 0 0  Restless 0 0 0  Easily annoyed or irritable 0 0 0  Afraid -  awful might happen 0 0 0  Total GAD 7 Score 0 0 0    BP Readings from Last 3 Encounters:  06/16/20 120/80  06/14/20 120/80  05/24/20 (!) 148/100    Physical Exam Vitals and nursing note reviewed.  HENT:     Head: Normocephalic.     Right Ear: External ear normal.     Left Ear: External ear normal.     Nose: Nose normal.  Eyes:     General: No scleral icterus.       Right eye: No discharge.        Left eye: No discharge.     Conjunctiva/sclera: Conjunctivae normal.     Pupils: Pupils are equal, round, and reactive to light.  Neck:     Thyroid: No thyromegaly.     Vascular: No JVD.     Trachea: No tracheal deviation.  Cardiovascular:     Rate and Rhythm: Normal rate and regular rhythm.     Heart sounds: Normal heart sounds. No murmur heard. No friction rub. No gallop.   Pulmonary:     Effort: No respiratory distress.     Breath sounds: Normal breath sounds. No wheezing, rhonchi or rales.  Abdominal:     General: Bowel sounds are normal.     Palpations: Abdomen is soft. There is no mass.      Tenderness: There is no abdominal tenderness. There is no guarding or rebound.  Musculoskeletal:        General: No tenderness. Normal range of motion.     Cervical back: Normal range of motion and neck supple.  Lymphadenopathy:     Cervical: No cervical adenopathy.  Skin:    General: Skin is warm.     Findings: No rash.  Neurological:     Mental Status: He is alert and oriented to person, place, and time.     Cranial Nerves: No cranial nerve deficit.     Deep Tendon Reflexes: Reflexes are normal and symmetric.     Wt Readings from Last 3 Encounters:  06/16/20 175 lb (79.4 kg)  06/14/20 177 lb 12.8 oz (80.6 kg)  05/24/20 178 lb (80.7 kg)    BP 120/80   Pulse 76   Ht 5\' 9"  (1.753 m)   Wt 175 lb (79.4 kg)   BMI 25.84 kg/m   Assessment and Plan:  1. Essential hypertension Chronic.  Controlled.  Stable.  Blood pressure is 120/80.  Continue propranolol ER 80 mg once a day.  We will check labs in 6 months. - propranolol ER (INDERAL LA) 80 MG 24 hr capsule; Take 1 capsule (80 mg total) by mouth daily.  Dispense: 90 each; Refill: 1  2. Migraine without status migrainosus, not intractable, unspecified migraine type Chronic.  Controlled.  Stable.  Prophylactic.  Continue propranolol ER 80 mg once a day. - propranolol ER (INDERAL LA) 80 MG 24 hr capsule; Take 1 capsule (80 mg total) by mouth daily.  Dispense: 90 each; Refill: 1  3. Degeneration, intervertebral disc, cervical Chronic.  Controlled.  Stable.  Continue cyclobenzaprine 10 mg 1 3 times a day as needed. - cyclobenzaprine (FLEXERIL) 10 MG tablet; Take 1 tablet (10 mg total) by mouth 3 (three) times daily as needed. for muscle spams  Dispense: 270 tablet; Refill: 1  4. Arthralgia of hip, unspecified laterality Chronic.  Controlled.  Stable.  Continue etodolac 500 mg twice a day. - etodolac (LODINE) 500 MG tablet; One twice a day  Dispense: 180 tablet; Refill:  1   

## 2020-06-19 ENCOUNTER — Telehealth: Payer: Self-pay | Admitting: Family Medicine

## 2020-06-19 NOTE — Telephone Encounter (Signed)
Called and left message 3 meds sent in on 5/20 and 2 sent in on 5/17. If further assistance needed, let me know

## 2020-06-19 NOTE — Telephone Encounter (Signed)
Pt is calling because he is requesting hard copies of all of his scripts. Pt states that he may go to different pharmacy Pt is requesting Baxter Flattery to call him when the scripts are available for pick up Cb- [phone]

## 2020-06-19 NOTE — Telephone Encounter (Signed)
Pt wants hard copies for next 90 days for all Rxs

## 2020-06-19 NOTE — Telephone Encounter (Signed)
Patient requesting to speak with Marcus Gonzalez regarding all his medications, patient did not want to elaborate

## 2020-06-23 ENCOUNTER — Other Ambulatory Visit: Payer: Self-pay

## 2020-06-23 DIAGNOSIS — M545 Low back pain, unspecified: Secondary | ICD-10-CM

## 2020-06-23 DIAGNOSIS — G8929 Other chronic pain: Secondary | ICD-10-CM

## 2020-06-29 ENCOUNTER — Ambulatory Visit (INDEPENDENT_AMBULATORY_CARE_PROVIDER_SITE_OTHER): Payer: Medicare Other | Admitting: Family Medicine

## 2020-06-29 ENCOUNTER — Ambulatory Visit: Payer: Self-pay | Admitting: Family Medicine

## 2020-06-29 ENCOUNTER — Encounter: Payer: Self-pay | Admitting: Family Medicine

## 2020-06-29 ENCOUNTER — Other Ambulatory Visit: Payer: Self-pay

## 2020-06-29 VITALS — BP 142/78 | HR 106 | Ht 69.0 in | Wt 173.0 lb

## 2020-06-29 DIAGNOSIS — M5416 Radiculopathy, lumbar region: Secondary | ICD-10-CM | POA: Diagnosis not present

## 2020-06-29 DIAGNOSIS — M5126 Other intervertebral disc displacement, lumbar region: Secondary | ICD-10-CM | POA: Diagnosis not present

## 2020-06-29 MED ORDER — MELOXICAM 15 MG PO TABS: 15.0000 mg | ORAL_TABLET | Freq: Every day | ORAL | 6 refills | Status: DC

## 2020-06-29 MED ORDER — CYCLOBENZAPRINE HCL 10 MG PO TABS: 10.0000 mg | ORAL_TABLET | Freq: Three times a day (TID) | ORAL | 5 refills | Status: DC | PRN

## 2020-06-29 MED ORDER — TRAMADOL HCL 50 MG PO TABS: 50.0000 mg | ORAL_TABLET | Freq: Four times a day (QID) | ORAL | 0 refills | Status: AC | PRN

## 2020-06-29 MED ORDER — PREDNISONE 10 MG PO TABS: 10.0000 mg | ORAL_TABLET | Freq: Every day | ORAL | 0 refills | Status: DC

## 2020-06-29 NOTE — Progress Notes (Signed)
Date:  06/29/2020   Name:  Marcus Gonzalez   DOB:  [birth year]   MRN:  [id number]   Chief Complaint: Back Pain (Lower back pain, hip and calf pain on L) side. Helps to rest it. But walking or sitting too long, makes it worse)  Back Pain This is a new problem. The current episode started 1 to 4 weeks ago (3 wwks). The problem occurs daily. The problem has been waxing and waning since onset. The pain is present in the lumbar spine. The quality of the pain is described as aching. The pain is at a severity of 7/10. The pain is moderate. The symptoms are aggravated by sitting, twisting and bending. Pertinent negatives include no abdominal pain, bladder incontinence, bowel incontinence, chest pain, dysuria, fever, headaches or paresthesias.    Lab Results  Component Value Date   CREATININE 1.02 12/16/2019   BUN 11 12/16/2019   NA 141 12/16/2019   K 4.8 12/16/2019   CL 102 12/16/2019   CO2 24 12/16/2019   Lab Results  Component Value Date   CHOL 185 12/16/2019   HDL 65 12/16/2019   LDLCALC 107 (H) 12/16/2019   TRIG 68 12/16/2019   CHOLHDL 2.3 11/29/2016   No results found for: TSH No results found for: HGBA1C No results found for: WBC, HGB, HCT, MCV, PLT Lab Results  Component Value Date   ALT 26 02/04/2020   AST 27 02/04/2020   ALKPHOS 71 02/04/2020   BILITOT 0.6 02/04/2020     Review of Systems  Constitutional: Negative for chills and fever.  HENT: Negative for drooling, ear discharge, ear pain and sore throat.   Respiratory: Negative for cough, shortness of breath and wheezing.   Cardiovascular: Negative for chest pain, palpitations and leg swelling.  Gastrointestinal: Negative for abdominal pain, blood in stool, bowel incontinence, constipation, diarrhea and nausea.  Endocrine: Negative for polydipsia.  Genitourinary: Negative for bladder incontinence, dysuria, frequency, hematuria and urgency.  Musculoskeletal: Positive for back pain. Negative for myalgias and neck  pain.  Skin: Negative for rash.  Allergic/Immunologic: Negative for environmental allergies.  Neurological: Negative for dizziness, headaches and paresthesias.  Hematological: Does not bruise/bleed easily.  Psychiatric/Behavioral: Negative for suicidal ideas. The patient is not nervous/anxious.     Patient Active Problem List   Diagnosis Date Noted  . Arthralgia of hip 06/03/2018  . 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 Encounter for screening colonoscopy   . Benign neoplasm of ascending colon   . Polyp of sigmoid colon   . Rectal polyp   . 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 Hyperlipidemia 11/29/2016  . Degeneration, intervertebral disc, cervical 04/19/2016  . Migraine without status migrainosus, not intractable 04/19/2016  . Essential hypertension 04/19/2016  . Cephalalgia 05/24/2014  . Routine general medical examination at a health care facility 05/24/2014  . Anxiety attack 05/24/2014  . Arthritis 05/24/2014  . Benign prostatic hyperplasia without lower urinary tract symptoms 05/24/2014  . Narrowing of intervertebral disc space 05/24/2014  . History of migraine headaches 05/24/2014  . Personal history of other malignant neoplasm of skin 02/28/2012    Allergies  Allergen Reactions  . Cephalexin     Past Surgical History:  Procedure Laterality Date  . BASAL CELL CARCINOMA EXCISION     arm and leg  . COLONOSCOPY  2013   cleared for 5 yrs- Dr Allen Norris  . COLONOSCOPY WITH PROPOFOL N/A 08/22/2017   Procedure: COLONOSCOPY WITH PROPOFOL;  Surgeon: Lucilla Lame, MD;  [facility];  Service: Endoscopy;  Laterality: N/A;  . POLYPECTOMY  08/22/2017   Procedure: POLYPECTOMY INTESTINAL;  Surgeon: Lucilla Lame, MD;  Location: [place];  Service: Endoscopy;;  . skin ca removed      Social History   Tobacco Use  . Smoking status: Former Smoker    Packs/day: 1.00    Years: 29.00    Pack years: 29.00    Types: Cigarettes    Quit date: 1999     Years since quitting: 23.4  . Smokeless tobacco: Never Used  . Tobacco comment: smoking cessation materials not required  Vaping Use  . Vaping Use: Never used  Substance Use Topics  . Alcohol use: Yes    Alcohol/week: 14.0 standard drinks    Types: 14 Glasses of wine per week  . Drug use: No     Medication list has been reviewed and updated.  Current Meds  Medication Sig  . amLODipine (NORVASC) 2.5 MG tablet TAKE 1 TABLET BY MOUTH DAILY  . Apple Cider Vinegar 188 MG CAPS Take by mouth.  . Ascorbic Acid (VITAMIN C) 100 MG tablet Take by mouth.  . B Complex Vitamins (VITAMIN B-COMPLEX) TABS Take by mouth.  Raelyn Ensign Pollen 1000 MG TABS Take by mouth.  . Bilberry 100 MG CAPS Take by mouth.  . Calcium Citrate 1040 MG TABS Take by mouth.  . Cinnamon 500 MG capsule Take by mouth.  . Coenzyme Q10 (COQ-10) 100 MG CAPS Take by mouth.  . Cranberry 200 MG CAPS Take by mouth.  . cyclobenzaprine (FLEXERIL) 10 MG tablet Take 1 tablet (10 mg total) by mouth 3 (three) times daily as needed. for muscle spams  . Echinacea 500 MG CAPS Take by mouth.  . etodolac (LODINE) 500 MG tablet One twice a day  . Flaxseed, Linseed, (FLAXSEED OIL PO) Take by mouth.  . Garlic 6295 MG CAPS Take by mouth.  . Glucosamine-Chondroitin (GLUCOSAMINE CHONDR COMPLEX PO) Take by mouth.  Marland Kitchen lisinopril (ZESTRIL) 20 MG tablet TAKE 1 TABLET BY MOUTH DAILY  . Multiple Vitamins-Minerals (MULTIVITAMIN WITH MINERALS) tablet Take 1 tablet by mouth daily.  . propranolol ER (INDERAL LA) 80 MG 24 hr capsule Take 1 capsule (80 mg total) by mouth daily.  Berniece Pap Cartilage 500 MG CAPS Take by mouth.  Marland Kitchen UNABLE TO FIND [employer] toxifier/regenerator herb    PHQ 2/9 Scores 06/16/2020 06/14/2020 11/10/2019 06/09/2019  PHQ - 2 Score 0 0 0 0  PHQ- 9 Score 0 - 0 -    GAD 7 : Generalized Anxiety Score 06/16/2020 11/10/2019 12/15/2018  Nervous, Anxious, on Edge 0 0 0  Control/stop worrying 0 0 0  Worry too much - different things 0 0 0   Trouble relaxing 0 0 0  Restless 0 0 0  Easily annoyed or irritable 0 0 0  Afraid - awful might happen 0 0 0  Total GAD 7 Score 0 0 0    BP Readings from Last 3 Encounters:  06/29/20 (!) 142/78  06/16/20 120/80  06/14/20 120/80    Physical Exam Vitals and nursing note reviewed.  HENT:     Head: Normocephalic.     Right Ear: External ear normal.     Left Ear: External ear normal.     Nose: Nose normal.  Eyes:     General: No scleral icterus.       Right eye: No discharge.        Left eye: No discharge.  Conjunctiva/sclera: Conjunctivae normal.     Pupils: Pupils are equal, round, and reactive to light.  Neck:     Thyroid: No thyromegaly.     Vascular: No JVD.     Trachea: No tracheal deviation.  Cardiovascular:     Rate and Rhythm: Normal rate and regular rhythm.     Heart sounds: Normal heart sounds. No murmur heard. No friction rub. No gallop.   Pulmonary:     Effort: No respiratory distress.     Breath sounds: Normal breath sounds. No wheezing or rales.  Abdominal:     General: Bowel sounds are normal.     Palpations: Abdomen is soft. There is no mass.     Tenderness: There is no abdominal tenderness. There is no guarding or rebound.  Musculoskeletal:        General: No tenderness.     Cervical back: Normal range of motion and neck supple.     Lumbar back: Spasms present. Positive left straight leg raise test. Negative right straight leg raise test.  Lymphadenopathy:     Cervical: No cervical adenopathy.  Skin:    General: Skin is warm.     Findings: No rash.  Neurological:     Mental Status: He is alert and oriented to person, place, and time.     Cranial Nerves: No cranial nerve deficit.     Sensory: Sensory deficit present.     Motor: Motor function is intact.     Deep Tendon Reflexes: Reflexes are normal and symmetric.     Comments: Paresthesia/decreased sensory left lateral     Wt Readings from Last 3 Encounters:  06/29/20 173 lb (78.5 kg)   06/16/20 175 lb (79.4 kg)  06/14/20 177 lb 12.8 oz (80.6 kg)    BP (!) 142/78   Pulse (!) 106   Ht 5\' 9"  (1.753 m)   Wt 173 lb (78.5 kg)   SpO2 97%   BMI 25.55 kg/m   Assessment and Plan:  1. Lumbar herniated disc New onset.  Persistent for 3 weeks.  Level of pain intermittent.  Presently stable but present with increased activity.  We will treat with meloxicam 15 mg once a day daily cyclobenzaprine 10 mg daily 8 hours as needed but may be necessary primarily at night.  Prednisone 10 mg starting at 20 mg for 1 week and then tapering to 10 mg a day.  And tramadol 50 mg 1 every 6 hours as needed severe pain.  Referral has been placed to orthopedics, notable for lower back pain for further evaluation and treatment as necessary. - meloxicam (MOBIC) 15 MG tablet; Take 1 tablet (15 mg total) by mouth daily.  Dispense: 30 tablet; Refill: 6 - cyclobenzaprine (FLEXERIL) 10 MG tablet; Take 1 tablet (10 mg total) by mouth 3 (three) times daily as needed for muscle spasms.  Dispense: 30 tablet; Refill: 5 - predniSONE (DELTASONE) 10 MG tablet; Take 1 tablet (10 mg total) by mouth daily with breakfast.  Dispense: 30 tablet; Refill: 0 - traMADol (ULTRAM) 50 MG tablet; Take 1 tablet (50 mg total) by mouth every 6 (six) hours as needed for up to 5 days.  Dispense: 20 tablet; Refill: 0 - Ambulatory referral to Orthopedic Surgery  2. Lumbar radiculopathy As noted above - meloxicam (MOBIC) 15 MG tablet; Take 1 tablet (15 mg total) by mouth daily.  Dispense: 30 tablet; Refill: 6 - cyclobenzaprine (FLEXERIL) 10 MG tablet; Take 1 tablet (10 mg total) by mouth 3 (three) times daily  as needed for muscle spasms.  Dispense: 30 tablet; Refill: 5 - predniSONE (DELTASONE) 10 MG tablet; Take 1 tablet (10 mg total) by mouth daily with breakfast.  Dispense: 30 tablet; Refill: 0 - traMADol (ULTRAM) 50 MG tablet; Take 1 tablet (50 mg total) by mouth every 6 (six) hours as needed for up to 5 days.  Dispense: 20 tablet;  Refill: 0 - Ambulatory referral to Orthopedic Surgery

## 2020-06-30 ENCOUNTER — Other Ambulatory Visit: Payer: Self-pay | Admitting: Family Medicine

## 2020-06-30 DIAGNOSIS — M5416 Radiculopathy, lumbar region: Secondary | ICD-10-CM

## 2020-06-30 DIAGNOSIS — M5126 Other intervertebral disc displacement, lumbar region: Secondary | ICD-10-CM

## 2020-06-30 NOTE — Telephone Encounter (Signed)
Spoke with pharmacy and scripts are ready for pick up. Patient has been notified.

## 2020-06-30 NOTE — Telephone Encounter (Signed)
Medication Refill - Medication: meloxicam (MOBIC) 15 MG tablet predniSONE (DELTASONE) 10 MG tablet traMADol (ULTRAM) 50 MG tablet     Preferred Pharmacy (with phone number or street name): [facility], [place] [address] AT [place]: Please be advised that RX refills may take up to 3 business days. We ask that you follow-up with your pharmacy.

## 2020-07-03 ENCOUNTER — Ambulatory Visit: Payer: Medicare Other | Admitting: Family Medicine

## 2020-07-07 DIAGNOSIS — M47816 Spondylosis without myelopathy or radiculopathy, lumbar region: Secondary | ICD-10-CM | POA: Diagnosis not present

## 2020-07-07 DIAGNOSIS — M5442 Lumbago with sciatica, left side: Secondary | ICD-10-CM | POA: Diagnosis not present

## 2020-07-07 DIAGNOSIS — M419 Scoliosis, unspecified: Secondary | ICD-10-CM | POA: Diagnosis not present

## 2020-07-07 DIAGNOSIS — G8929 Other chronic pain: Secondary | ICD-10-CM | POA: Diagnosis not present

## 2020-07-07 DIAGNOSIS — I700 Atherosclerosis of aorta: Secondary | ICD-10-CM | POA: Insufficient documentation

## 2020-07-07 DIAGNOSIS — M5136 Other intervertebral disc degeneration, lumbar region: Secondary | ICD-10-CM | POA: Diagnosis not present

## 2020-07-07 DIAGNOSIS — M5117 Intervertebral disc disorders with radiculopathy, lumbosacral region: Secondary | ICD-10-CM | POA: Diagnosis not present

## 2020-07-07 DIAGNOSIS — M5417 Radiculopathy, lumbosacral region: Secondary | ICD-10-CM | POA: Diagnosis not present

## 2020-08-11 DIAGNOSIS — N529 Male erectile dysfunction, unspecified: Secondary | ICD-10-CM | POA: Diagnosis not present

## 2020-08-11 DIAGNOSIS — Z08 Encounter for follow-up examination after completed treatment for malignant neoplasm: Secondary | ICD-10-CM | POA: Diagnosis not present

## 2020-08-11 DIAGNOSIS — Z9229 Personal history of other drug therapy: Secondary | ICD-10-CM | POA: Diagnosis not present

## 2020-08-11 DIAGNOSIS — C61 Malignant neoplasm of prostate: Secondary | ICD-10-CM | POA: Diagnosis not present

## 2020-08-11 DIAGNOSIS — Z8546 Personal history of malignant neoplasm of prostate: Secondary | ICD-10-CM | POA: Diagnosis not present

## 2020-08-11 DIAGNOSIS — Z923 Personal history of irradiation: Secondary | ICD-10-CM | POA: Diagnosis not present

## 2020-09-11 ENCOUNTER — Other Ambulatory Visit: Payer: Self-pay

## 2020-09-11 ENCOUNTER — Telehealth: Payer: Self-pay

## 2020-09-11 DIAGNOSIS — M5416 Radiculopathy, lumbar region: Secondary | ICD-10-CM

## 2020-09-11 DIAGNOSIS — M5030 Other cervical disc degeneration, unspecified cervical region: Secondary | ICD-10-CM

## 2020-09-11 DIAGNOSIS — G43909 Migraine, unspecified, not intractable, without status migrainosus: Secondary | ICD-10-CM

## 2020-09-11 DIAGNOSIS — M5126 Other intervertebral disc displacement, lumbar region: Secondary | ICD-10-CM

## 2020-09-11 DIAGNOSIS — I10 Essential (primary) hypertension: Secondary | ICD-10-CM

## 2020-09-11 MED ORDER — PROPRANOLOL HCL ER 80 MG PO CP24: 80.0000 mg | ORAL_CAPSULE | Freq: Every day | ORAL | 1 refills | Status: DC

## 2020-09-11 MED ORDER — LISINOPRIL 20 MG PO TABS: 20.0000 mg | ORAL_TABLET | Freq: Every day | ORAL | 1 refills | Status: DC

## 2020-09-11 MED ORDER — MELOXICAM 15 MG PO TABS: 15.0000 mg | ORAL_TABLET | Freq: Every day | ORAL | 1 refills | Status: DC

## 2020-09-11 MED ORDER — CYCLOBENZAPRINE HCL 10 MG PO TABS: 10.0000 mg | ORAL_TABLET | Freq: Three times a day (TID) | ORAL | 1 refills | Status: DC | PRN

## 2020-09-11 MED ORDER — AMLODIPINE BESYLATE 2.5 MG PO TABS: 2.5000 mg | ORAL_TABLET | Freq: Every day | ORAL | 1 refills | Status: DC

## 2020-09-11 NOTE — Progress Notes (Signed)
Prescriptions printed for pt to pick up and shop around

## 2020-09-11 NOTE — Telephone Encounter (Signed)
Copied from [place] [phone]. Topic: General - Inquiry >> Sep 11, 2020  9:43 AM Valere Dross wrote: Reason for CRM: pt called in wanting to speak with Baxter Flattery about something they had spoken on before, about getting a hard copy of his medication refill so he can figure out what pharmacy he wants to change to. Please advise.

## 2020-09-20 DIAGNOSIS — C44619 Basal cell carcinoma of skin of left upper limb, including shoulder: Secondary | ICD-10-CM | POA: Diagnosis not present

## 2020-09-20 DIAGNOSIS — D2261 Melanocytic nevi of right upper limb, including shoulder: Secondary | ICD-10-CM | POA: Diagnosis not present

## 2020-09-20 DIAGNOSIS — L538 Other specified erythematous conditions: Secondary | ICD-10-CM | POA: Diagnosis not present

## 2020-09-20 DIAGNOSIS — B078 Other viral warts: Secondary | ICD-10-CM | POA: Diagnosis not present

## 2020-09-20 DIAGNOSIS — X32XXXA Exposure to sunlight, initial encounter: Secondary | ICD-10-CM | POA: Diagnosis not present

## 2020-09-20 DIAGNOSIS — L570 Actinic keratosis: Secondary | ICD-10-CM | POA: Diagnosis not present

## 2020-09-20 DIAGNOSIS — D2262 Melanocytic nevi of left upper limb, including shoulder: Secondary | ICD-10-CM | POA: Diagnosis not present

## 2020-09-20 DIAGNOSIS — Z85828 Personal history of other malignant neoplasm of skin: Secondary | ICD-10-CM | POA: Diagnosis not present

## 2020-09-20 DIAGNOSIS — D2272 Melanocytic nevi of left lower limb, including hip: Secondary | ICD-10-CM | POA: Diagnosis not present

## 2020-09-20 DIAGNOSIS — C4441 Basal cell carcinoma of skin of scalp and neck: Secondary | ICD-10-CM | POA: Diagnosis not present

## 2020-09-20 DIAGNOSIS — D485 Neoplasm of uncertain behavior of skin: Secondary | ICD-10-CM | POA: Diagnosis not present

## 2020-11-01 DIAGNOSIS — C44619 Basal cell carcinoma of skin of left upper limb, including shoulder: Secondary | ICD-10-CM | POA: Diagnosis not present

## 2020-11-06 DIAGNOSIS — L988 Other specified disorders of the skin and subcutaneous tissue: Secondary | ICD-10-CM | POA: Diagnosis not present

## 2020-11-06 DIAGNOSIS — L578 Other skin changes due to chronic exposure to nonionizing radiation: Secondary | ICD-10-CM | POA: Diagnosis not present

## 2020-11-06 DIAGNOSIS — C4441 Basal cell carcinoma of skin of scalp and neck: Secondary | ICD-10-CM | POA: Diagnosis not present

## 2020-11-06 DIAGNOSIS — L814 Other melanin hyperpigmentation: Secondary | ICD-10-CM | POA: Diagnosis not present

## 2020-11-07 ENCOUNTER — Ambulatory Visit (INDEPENDENT_AMBULATORY_CARE_PROVIDER_SITE_OTHER): Payer: Medicare Other

## 2020-11-07 ENCOUNTER — Other Ambulatory Visit: Payer: Self-pay

## 2020-11-07 DIAGNOSIS — Z23 Encounter for immunization: Secondary | ICD-10-CM | POA: Diagnosis not present

## 2020-11-24 ENCOUNTER — Other Ambulatory Visit: Payer: Self-pay

## 2020-11-24 ENCOUNTER — Encounter: Payer: Self-pay | Admitting: Family Medicine

## 2020-11-24 ENCOUNTER — Ambulatory Visit: Payer: Self-pay

## 2020-11-24 ENCOUNTER — Ambulatory Visit (INDEPENDENT_AMBULATORY_CARE_PROVIDER_SITE_OTHER): Payer: Medicare Other | Admitting: Family Medicine

## 2020-11-24 VITALS — BP 146/80 | HR 80 | Ht 69.0 in | Wt 180.0 lb

## 2020-11-24 DIAGNOSIS — B356 Tinea cruris: Secondary | ICD-10-CM | POA: Diagnosis not present

## 2020-11-24 MED ORDER — CLOTRIMAZOLE-BETAMETHASONE 1-0.05 % EX CREA: 1.0000 "application " | TOPICAL_CREAM | Freq: Every day | CUTANEOUS | 2 refills | Status: DC

## 2020-11-24 NOTE — Progress Notes (Signed)
Date:  11/24/2020   Name:  Marcus Gonzalez   DOB:  [birth year]   MRN:  [id number]   Chief Complaint: Rash (In groin area/crease of leg- underwear aggravated it)  Rash This is a recurrent problem. The current episode started yesterday. The problem has been gradually worsening since onset. The rash is characterized by redness. He was exposed to nothing. Pertinent negatives include no shortness of breath. The treatment provided moderate relief.   Lab Results  Component Value Date   CREATININE 1.02 12/16/2019   BUN 11 12/16/2019   NA 141 12/16/2019   K 4.8 12/16/2019   CL 102 12/16/2019   CO2 24 12/16/2019   Lab Results  Component Value Date   CHOL 185 12/16/2019   HDL 65 12/16/2019   LDLCALC 107 (H) 12/16/2019   TRIG 68 12/16/2019   CHOLHDL 2.3 11/29/2016   No results found for: TSH No results found for: HGBA1C No results found for: WBC, HGB, HCT, MCV, PLT Lab Results  Component Value Date   ALT 26 02/04/2020   AST 27 02/04/2020   ALKPHOS 71 02/04/2020   BILITOT 0.6 02/04/2020     Review of Systems  Respiratory:  Negative for shortness of breath and wheezing.   Cardiovascular:  Negative for chest pain.  Skin:  Positive for rash.   Patient Active Problem List   Diagnosis Date Noted   Arthralgia of hip 06/03/2018   Arthritis of foot 06/03/2018   Encounter for screening colonoscopy    Benign neoplasm of ascending colon    Polyp of sigmoid colon    Rectal polyp    Primary localized osteoarthrosis, hand 05/05/2017   Hyperlipidemia 11/29/2016   Degeneration, intervertebral disc, cervical 04/19/2016   Migraine without status migrainosus, not intractable 04/19/2016   Essential hypertension 04/19/2016   Cephalalgia 05/24/2014   Routine general medical examination at a health care facility 05/24/2014   Anxiety attack 05/24/2014   Arthritis 05/24/2014   Benign prostatic hyperplasia without lower urinary tract symptoms 05/24/2014   Narrowing of intervertebral disc  space 05/24/2014   History of migraine headaches 05/24/2014   Personal history of other malignant neoplasm of skin 02/28/2012    Allergies  Allergen Reactions   Cephalexin     Past Surgical History:  Procedure Laterality Date   BASAL CELL CARCINOMA EXCISION     arm and leg   COLONOSCOPY  2013   cleared for 5 yrs- Dr Allen Norris   COLONOSCOPY WITH PROPOFOL N/A 08/22/2017   Procedure: COLONOSCOPY WITH PROPOFOL;  Surgeon: Lucilla Lame, MD;  Location: [place];  Service: Endoscopy;  Laterality: N/A;   POLYPECTOMY  08/22/2017   Procedure: POLYPECTOMY INTESTINAL;  Surgeon: Lucilla Lame, MD;  Location: [place];  Service: Endoscopy;;   skin ca removed      Social History   Tobacco Use   Smoking status: Former    Packs/day: 1.00    Years: 29.00    Pack years: 29.00    Types: Cigarettes    Quit date: 1999    Years since quitting: 23.8   Smokeless tobacco: Never   Tobacco comments:    smoking cessation materials not required  Vaping Use   Vaping Use: Never used  Substance Use Topics   Alcohol use: Yes    Alcohol/week: 14.0 standard drinks    Types: 14 Glasses of wine per week   Drug use: No     Medication list has been reviewed and updated.  Current Meds  Medication  Sig   amLODipine (NORVASC) 2.5 MG tablet Take 1 tablet (2.5 mg total) by mouth daily.   Apple Cider Vinegar 188 MG CAPS Take by mouth.   Ascorbic Acid (VITAMIN C) 100 MG tablet Take by mouth.   B Complex Vitamins (VITAMIN B-COMPLEX) TABS Take by mouth.   Bee Pollen 1000 MG TABS Take by mouth.   Bilberry 100 MG CAPS Take by mouth.   Calcium Citrate 1040 MG TABS Take by mouth.   Cinnamon 500 MG capsule Take by mouth.   Coenzyme Q10 (COQ-10) 100 MG CAPS Take by mouth.   Cranberry 200 MG CAPS Take by mouth.   cyclobenzaprine (FLEXERIL) 10 MG tablet Take 1 tablet (10 mg total) by mouth 3 (three) times daily as needed for muscle spasms.   cyclobenzaprine (FLEXERIL) 10 MG tablet Take 1 tablet (10  mg total) by mouth 3 (three) times daily as needed. for muscle spams   Echinacea 500 MG CAPS Take by mouth.   etodolac (LODINE) 500 MG tablet One twice a day   Flaxseed, Linseed, (FLAXSEED OIL PO) Take by mouth.   Garlic 2751 MG CAPS Take by mouth.   Glucosamine-Chondroitin (GLUCOSAMINE CHONDR COMPLEX PO) Take by mouth.   lisinopril (ZESTRIL) 20 MG tablet Take 1 tablet (20 mg total) by mouth daily.   meloxicam (MOBIC) 15 MG tablet Take 1 tablet (15 mg total) by mouth daily.   Multiple Vitamins-Minerals (MULTIVITAMIN WITH MINERALS) tablet Take 1 tablet by mouth daily.   propranolol ER (INDERAL LA) 80 MG 24 hr capsule Take 1 capsule (80 mg total) by mouth daily.   Shark Cartilage 500 MG CAPS Take by mouth.   UNABLE TO FIND [employer] toxifier/regenerator herb   [DISCONTINUED] predniSONE (DELTASONE) 10 MG tablet Take 1 tablet (10 mg total) by mouth daily with breakfast.    PHQ 2/9 Scores 11/24/2020 06/16/2020 06/14/2020 11/10/2019  PHQ - 2 Score 0 0 0 0  PHQ- 9 Score 0 0 - 0    GAD 7 : Generalized Anxiety Score 11/24/2020 06/16/2020 11/10/2019 12/15/2018  Nervous, Anxious, on Edge 0 0 0 0  Control/stop worrying 0 0 0 0  Worry too much - different things 0 0 0 0  Trouble relaxing 0 0 0 0  Restless 0 0 0 0  Easily annoyed or irritable 0 0 0 0  Afraid - awful might happen 0 0 0 0  Total GAD 7 Score 0 0 0 0    BP Readings from Last 3 Encounters:  11/24/20 (!) 146/80  06/29/20 (!) 142/78  06/16/20 120/80    Physical Exam Vitals and nursing note reviewed.  HENT:     Head: Normocephalic.     Right Ear: Tympanic membrane and external ear normal. There is no impacted cerumen.     Left Ear: Tympanic membrane and external ear normal. There is no impacted cerumen.     Nose: Nose normal. No congestion or rhinorrhea.  Eyes:     General: No scleral icterus.       Right eye: No discharge.        Left eye: No discharge.     Conjunctiva/sclera: Conjunctivae normal.     Pupils: Pupils are equal,  round, and reactive to light.  Neck:     Thyroid: No thyromegaly.     Vascular: No JVD.     Trachea: No tracheal deviation.  Cardiovascular:     Rate and Rhythm: Normal rate and regular rhythm.     Heart sounds: Normal heart sounds.  No murmur heard.   No friction rub. No gallop.  Pulmonary:     Effort: No respiratory distress.     Breath sounds: Normal breath sounds. No wheezing, rhonchi or rales.  Abdominal:     General: Bowel sounds are normal.     Palpations: Abdomen is soft. There is no mass.     Tenderness: There is no abdominal tenderness. There is no guarding or rebound.  Musculoskeletal:        General: No tenderness. Normal range of motion.     Cervical back: Normal range of motion and neck supple.  Lymphadenopathy:     Cervical: No cervical adenopathy.  Skin:    General: Skin is warm.     Findings: No bruising or rash.  Neurological:     Mental Status: He is alert and oriented to person, place, and time.     Cranial Nerves: No cranial nerve deficit.     Deep Tendon Reflexes: Reflexes are normal and symmetric.    Wt Readings from Last 3 Encounters:  11/24/20 180 lb (81.6 kg)  06/29/20 173 lb (78.5 kg)  06/16/20 175 lb (79.4 kg)    BP (!) 146/80   Pulse 80   Ht 5\' 9"  (1.753 m)   Wt 180 lb (81.6 kg)   BMI 26.58 kg/m   Assessment and Plan:   1. Tinea inguinalis Acute.  Recurrent.  Stable.  Area of erythema and irritation in the right inguinal aspect.  We will initiate Lotrisone cream apply twice a day.  With refills - clotrimazole-betamethasone (LOTRISONE) cream; Apply 1 application topically daily.  Dispense: 30 g; Refill: 2

## 2020-11-24 NOTE — Telephone Encounter (Signed)
Pt. Noticed a red, painful rash to right thigh at his groin area. Red, not raised. No fever. Appoinment made for today.    Reason for Disposition  [1] Severe localized itching AND [2] after 2 days of steroid cream  Answer Assessment - Initial Assessment Questions 1. APPEARANCE of RASH: "Describe the rash."      Red 2. LOCATION: "Where is the rash located?"      Right thigh 3. NUMBER: "How many spots are there?"      N/a 4. SIZE: "How big are the spots?" (Inches, centimeters or compare to size of a coin)      N/a 5. ONSET: "When did the rash start?"      Yesterday 6. ITCHING: "Does the rash itch?" If Yes, ask: "How bad is the itch?"  (Scale 0-10; or none, mild, moderate, severe)     No 7. PAIN: "Does the rash hurt?" If Yes, ask: "How bad is the pain?"  (Scale 0-10; or none, mild, moderate, severe)    - NONE (0): no pain    - MILD (1-3): doesn't interfere with normal activities     - MODERATE (4-7): interferes with normal activities or awakens from sleep     - SEVERE (8-10): excruciating pain, unable to do any normal activities     Mild 8. OTHER SYMPTOMS: "Do you have any other symptoms?" (e.g., fever)     No 9. PREGNANCY: "Is there any chance you are pregnant?" "When was your last menstrual period?"     N/A  Protocols used: Rash or Redness - Localized-A-AH

## 2020-12-18 ENCOUNTER — Ambulatory Visit (INDEPENDENT_AMBULATORY_CARE_PROVIDER_SITE_OTHER): Payer: Medicare Other | Admitting: Family Medicine

## 2020-12-18 ENCOUNTER — Encounter: Payer: Self-pay | Admitting: Family Medicine

## 2020-12-18 ENCOUNTER — Other Ambulatory Visit: Payer: Self-pay

## 2020-12-18 DIAGNOSIS — I10 Essential (primary) hypertension: Secondary | ICD-10-CM | POA: Diagnosis not present

## 2020-12-18 DIAGNOSIS — M5126 Other intervertebral disc displacement, lumbar region: Secondary | ICD-10-CM | POA: Diagnosis not present

## 2020-12-18 DIAGNOSIS — G43909 Migraine, unspecified, not intractable, without status migrainosus: Secondary | ICD-10-CM

## 2020-12-18 MED ORDER — AMLODIPINE BESYLATE 2.5 MG PO TABS: 2.5000 mg | ORAL_TABLET | Freq: Every day | ORAL | 1 refills | Status: DC

## 2020-12-18 MED ORDER — CYCLOBENZAPRINE HCL 10 MG PO TABS: 10.0000 mg | ORAL_TABLET | Freq: Every evening | ORAL | 1 refills | Status: DC | PRN

## 2020-12-18 MED ORDER — PROPRANOLOL HCL ER 80 MG PO CP24: 80.0000 mg | ORAL_CAPSULE | Freq: Every day | ORAL | 1 refills | Status: DC

## 2020-12-18 MED ORDER — LISINOPRIL 20 MG PO TABS
20.0000 mg | ORAL_TABLET | Freq: Every day | ORAL | 1 refills | Status: DC
Start: 2020-12-18 — End: 2021-06-20

## 2020-12-18 MED ORDER — MELOXICAM 15 MG PO TABS: 15.0000 mg | ORAL_TABLET | Freq: Every day | ORAL | 1 refills | Status: DC

## 2020-12-18 NOTE — Progress Notes (Signed)
Date:  12/18/2020   Name:  Marcus Gonzalez   DOB:  [birth year]   MRN:  [id number]   Chief Complaint: Medication Refill  Hypertension This is a chronic problem. The current episode started more than 1 year ago. The problem has been gradually improving since onset. The problem is controlled. Pertinent negatives include no anxiety, blurred vision, chest pain, headaches, malaise/fatigue, neck pain, orthopnea, palpitations, peripheral edema, PND, shortness of breath or sweats. There are no associated agents to hypertension. Past treatments include ACE inhibitors, beta blockers and calcium channel blockers. The current treatment provides moderate improvement. There are no compliance problems.  There is no history of angina, kidney disease, CAD/MI, CVA, heart failure, left ventricular hypertrophy, PVD or retinopathy. There is no history of chronic renal disease, a hypertension causing med or renovascular disease.   Lab Results  Component Value Date   CREATININE 1.02 12/16/2019   BUN 11 12/16/2019   NA 141 12/16/2019   K 4.8 12/16/2019   CL 102 12/16/2019   CO2 24 12/16/2019   Lab Results  Component Value Date   CHOL 185 12/16/2019   HDL 65 12/16/2019   LDLCALC 107 (H) 12/16/2019   TRIG 68 12/16/2019   CHOLHDL 2.3 11/29/2016   No results found for: TSH No results found for: HGBA1C No results found for: WBC, HGB, HCT, MCV, PLT Lab Results  Component Value Date   ALT 26 02/04/2020   AST 27 02/04/2020   ALKPHOS 71 02/04/2020   BILITOT 0.6 02/04/2020   No components found for: VITD  Review of Systems  Constitutional:  Negative for chills, fever and malaise/fatigue.  HENT:  Negative for drooling, ear discharge, ear pain and sore throat.   Eyes:  Negative for blurred vision.  Respiratory:  Negative for cough, shortness of breath and wheezing.   Cardiovascular:  Negative for chest pain, palpitations, orthopnea, leg swelling and PND.  Gastrointestinal:  Negative for abdominal pain,  blood in stool, constipation, diarrhea and nausea.  Endocrine: Negative for polydipsia.  Genitourinary:  Negative for dysuria, frequency, hematuria and urgency.  Musculoskeletal:  Negative for back pain, myalgias and neck pain.  Skin:  Negative for rash.  Allergic/Immunologic: Negative for environmental allergies.  Neurological:  Negative for dizziness and headaches.  Hematological:  Does not bruise/bleed easily.  Psychiatric/Behavioral:  Negative for suicidal ideas. The patient is not nervous/anxious.    Patient Active Problem List   Diagnosis Date Noted   Arthralgia of hip 06/03/2018   Arthritis of foot 06/03/2018   Encounter for screening colonoscopy    Benign neoplasm of ascending colon    Polyp of sigmoid colon    Rectal polyp    Primary localized osteoarthrosis, hand 05/05/2017   Hyperlipidemia 11/29/2016   Degeneration, intervertebral disc, cervical 04/19/2016   Migraine without status migrainosus, not intractable 04/19/2016   Essential hypertension 04/19/2016   Cephalalgia 05/24/2014   Routine general medical examination at a health care facility 05/24/2014   Anxiety attack 05/24/2014   Arthritis 05/24/2014   Benign prostatic hyperplasia without lower urinary tract symptoms 05/24/2014   Narrowing of intervertebral disc space 05/24/2014   History of migraine headaches 05/24/2014   Personal history of other malignant neoplasm of skin 02/28/2012    Allergies  Allergen Reactions   Cephalexin     Past Surgical History:  Procedure Laterality Date   BASAL CELL CARCINOMA EXCISION     arm and leg   COLONOSCOPY  2013   cleared for 5 yrs- Dr Allen Norris  COLONOSCOPY WITH PROPOFOL N/A 08/22/2017   Procedure: COLONOSCOPY WITH PROPOFOL;  Surgeon: Lucilla Lame, MD;  Location: [place];  Service: Endoscopy;  Laterality: N/A;   POLYPECTOMY  08/22/2017   Procedure: POLYPECTOMY INTESTINAL;  Surgeon: Lucilla Lame, MD;  Location: [place];  Service: Endoscopy;;   skin  ca removed      Social History   Tobacco Use   Smoking status: Former    Packs/day: 1.00    Years: 29.00    Pack years: 29.00    Types: Cigarettes    Quit date: 1999    Years since quitting: 23.9   Smokeless tobacco: Never   Tobacco comments:    smoking cessation materials not required  Vaping Use   Vaping Use: Never used  Substance Use Topics   Alcohol use: Yes    Alcohol/week: 14.0 standard drinks    Types: 14 Glasses of wine per week   Drug use: No     Medication list has been reviewed and updated.  Current Meds  Medication Sig   amLODipine (NORVASC) 2.5 MG tablet Take 1 tablet (2.5 mg total) by mouth daily.   Apple Cider Vinegar 188 MG CAPS Take by mouth.   Ascorbic Acid (VITAMIN C) 100 MG tablet Take by mouth.   B Complex Vitamins (VITAMIN B-COMPLEX) TABS Take by mouth.   Bee Pollen 1000 MG TABS Take by mouth.   Bilberry 100 MG CAPS Take by mouth.   Calcium Citrate 1040 MG TABS Take by mouth.   Cinnamon 500 MG capsule Take by mouth.   clotrimazole-betamethasone (LOTRISONE) cream Apply 1 application topically daily.   Coenzyme Q10 (COQ-10) 100 MG CAPS Take by mouth.   Cranberry 200 MG CAPS Take by mouth.   cyclobenzaprine (FLEXERIL) 10 MG tablet Take 1 tablet (10 mg total) by mouth 3 (three) times daily as needed for muscle spasms. (Patient taking differently: Take 10 mg by mouth at bedtime as needed for muscle spasms.)   cyclobenzaprine (FLEXERIL) 10 MG tablet Take 1 tablet (10 mg total) by mouth 3 (three) times daily as needed. for muscle spams   Echinacea 500 MG CAPS Take by mouth.   etodolac (LODINE) 500 MG tablet One twice a day   Flaxseed, Linseed, (FLAXSEED OIL PO) Take by mouth.   Garlic 2355 MG CAPS Take by mouth.   Glucosamine-Chondroitin (GLUCOSAMINE CHONDR COMPLEX PO) Take by mouth.   lisinopril (ZESTRIL) 20 MG tablet Take 1 tablet (20 mg total) by mouth daily.   meloxicam (MOBIC) 15 MG tablet Take 1 tablet (15 mg total) by mouth daily.   Multiple  Vitamins-Minerals (MULTIVITAMIN WITH MINERALS) tablet Take 1 tablet by mouth daily.   propranolol ER (INDERAL LA) 80 MG 24 hr capsule Take 1 capsule (80 mg total) by mouth daily.   Shark Cartilage 500 MG CAPS Take by mouth.   tadalafil (CIALIS) 20 MG tablet Take one tablet PRN for erectile dysfunction   UNABLE TO FIND [employer] toxifier/regenerator herb    PHQ 2/9 Scores 12/18/2020 11/24/2020 06/16/2020 06/14/2020  PHQ - 2 Score 0 0 0 0  PHQ- 9 Score 2 0 0 -    GAD 7 : Generalized Anxiety Score 12/18/2020 11/24/2020 06/16/2020 11/10/2019  Nervous, Anxious, on Edge 1 0 0 0  Control/stop worrying 0 0 0 0  Worry too much - different things 0 0 0 0  Trouble relaxing 1 0 0 0  Restless 0 0 0 0  Easily annoyed or irritable 0 0 0 0  Afraid -  awful might happen 0 0 0 0  Total GAD 7 Score 2 0 0 0  Anxiety Difficulty Not difficult at all - - -    BP Readings from Last 3 Encounters:  12/18/20 132/84  11/24/20 (!) 146/80  06/29/20 (!) 142/78    Physical Exam Vitals and nursing note reviewed.  HENT:     Head: Normocephalic.     Right Ear: Tympanic membrane, ear canal and external ear normal.     Left Ear: Tympanic membrane, ear canal and external ear normal.     Nose: Nose normal. No congestion or rhinorrhea.  Eyes:     General: No scleral icterus.       Right eye: No discharge.        Left eye: No discharge.     Conjunctiva/sclera: Conjunctivae normal.     Pupils: Pupils are equal, round, and reactive to light.  Neck:     Thyroid: No thyromegaly.     Vascular: No carotid bruit or JVD.     Trachea: No tracheal deviation.  Cardiovascular:     Rate and Rhythm: Normal rate and regular rhythm.     Heart sounds: Normal heart sounds. No murmur heard.   No friction rub. No gallop.  Pulmonary:     Effort: No respiratory distress.     Breath sounds: Normal breath sounds. No wheezing, rhonchi or rales.  Abdominal:     General: Bowel sounds are normal.     Palpations: Abdomen is soft. There  is no mass.     Tenderness: There is no abdominal tenderness. There is no guarding or rebound.  Musculoskeletal:        General: No tenderness. Normal range of motion.     Cervical back: Normal range of motion and neck supple.  Lymphadenopathy:     Cervical: No cervical adenopathy.  Skin:    General: Skin is warm.     Findings: No rash.  Neurological:     Mental Status: He is alert and oriented to person, place, and time.     Cranial Nerves: No cranial nerve deficit.     Deep Tendon Reflexes: Reflexes are normal and symmetric.    Wt Readings from Last 3 Encounters:  12/18/20 176 lb (79.8 kg)  11/24/20 180 lb (81.6 kg)  06/29/20 173 lb (78.5 kg)    BP 132/84 (BP Location: Left Arm, Patient Position: Sitting, Cuff Size: Normal)   Pulse 80   Resp 18   Ht 5\' 9"  (1.753 m)   Wt 176 lb (79.8 kg)   BMI 25.99 kg/m   Assessment and Plan:  1. Essential hypertension Chronic.  Controlled.  Stable.  Blood pressure 132/84.  Continue combination of lisinopril 20 mg once a day, amlodipine 2.5 mg once a day, and propranolol ER 80 mg once a day.  Review of renal function is acceptable and will recheck in 6 months - propranolol ER (INDERAL LA) 80 MG 24 hr capsule; Take 1 capsule (80 mg total) by mouth daily.  Dispense: 90 capsule; Refill: 1 - amLODipine (NORVASC) 2.5 MG tablet; Take 1 tablet (2.5 mg total) by mouth daily.  Dispense: 90 tablet; Refill: 1 - lisinopril (ZESTRIL) 20 MG tablet; Take 1 tablet (20 mg total) by mouth daily.  Dispense: 90 tablet; Refill: 1  2. Lumbar herniated disc Chronic.  Controlled.  Stable.  Was currently taking both meloxicam and Lodine and encouraged her to select 1 in which he would like to continue meloxicam 15 mg once a  day.  This seems to particularly help at his knee as well as treat his back discomfort as well - meloxicam (MOBIC) 15 MG tablet; Take 1 tablet (15 mg total) by mouth daily.  Dispense: 90 tablet; Refill: 1 - cyclobenzaprine (FLEXERIL) 10 MG  tablet; Take 1 tablet (10 mg total) by mouth at bedtime as needed for muscle spasms.  Dispense: 90 tablet; Refill: 1  3. Migraine without status migrainosus, not intractable, unspecified migraine type Chronic.  Controlled.  Stable.  Prophylaxis for migraines we will continue propranolol ER 80 mg daily. - propranolol ER (INDERAL LA) 80 MG 24 hr capsule; Take 1 capsule (80 mg total) by mouth daily.  Dispense: 90 capsule; Refill: 1

## 2021-02-19 ENCOUNTER — Ambulatory Visit: Payer: Self-pay

## 2021-02-19 ENCOUNTER — Other Ambulatory Visit: Payer: Self-pay

## 2021-02-19 DIAGNOSIS — G43909 Migraine, unspecified, not intractable, without status migrainosus: Secondary | ICD-10-CM

## 2021-02-19 DIAGNOSIS — I10 Essential (primary) hypertension: Secondary | ICD-10-CM

## 2021-02-19 MED ORDER — PROPRANOLOL HCL ER 80 MG PO CP24: 80.0000 mg | ORAL_CAPSULE | Freq: Every day | ORAL | 0 refills | Status: DC

## 2021-02-19 NOTE — Telephone Encounter (Signed)
Called [facility] and phoned in refill. Called pt and he stated [facility] Pt stated he does not need it right now. Called Penny at [facility] and advised her that it was filled just at another pharmacy. Reason for Disposition  Pharmacy with prescription refill question and triager answers question  Answer Assessment - Initial Assessment Questions 1. DRUG NAME: "What medicine do you need to have refilled?"     Nothing- prescription is at [facility] 2. REFILLS REMAINING: "How many refills are remaining?" (Note: The label on the medicine or pill bottle will show how many refills are remaining. If there are no refills remaining, then a renewal may be needed.)     N/a 3. EXPIRATION DATE: "What is the expiration date?" (Note: The label states when the prescription will expire, and thus can no longer be refilled.)     N/a 4. PRESCRIBING HCP: "Who prescribed it?" Reason: If prescribed by specialist, call should be referred to that group.     *No Answer* 5. SYMPTOMS: "Do you have any symptoms?"     *No Answer* 6. PREGNANCY: "Is there any chance that you are pregnant?" "When was your last menstrual period?"     *No Answer*  Protocols used: Medication Refill and Renewal Call-A-AH

## 2021-02-28 ENCOUNTER — Encounter: Payer: Self-pay | Admitting: Family Medicine

## 2021-02-28 ENCOUNTER — Other Ambulatory Visit: Payer: Self-pay

## 2021-02-28 ENCOUNTER — Ambulatory Visit (INDEPENDENT_AMBULATORY_CARE_PROVIDER_SITE_OTHER): Payer: Medicare Other | Admitting: Family Medicine

## 2021-02-28 VITALS — BP 130/78 | HR 68 | Ht 69.0 in | Wt 172.0 lb

## 2021-02-28 DIAGNOSIS — S0012XA Contusion of left eyelid and periocular area, initial encounter: Secondary | ICD-10-CM | POA: Diagnosis not present

## 2021-02-28 NOTE — Progress Notes (Signed)
Date:  02/28/2021   Name:  Marcus Gonzalez   DOB:  September 07, 1950   MRN:  [id number]   Chief Complaint: Fall (Had 2 falls within a 2 week period of each other. Last one was Dec 18th- hit head above L) eye both times. Eye was black, sensation feels different above eye now)  HPI  Lab Results  Component Value Date   NA 141 12/16/2019   K 4.8 12/16/2019   CO2 24 12/16/2019   GLUCOSE 102 (H) 12/16/2019   BUN 11 12/16/2019   CREATININE 1.02 12/16/2019   CALCIUM 9.6 12/16/2019   GFRNONAA 74 12/16/2019   Lab Results  Component Value Date   CHOL 185 12/16/2019   HDL 65 12/16/2019   LDLCALC 107 (H) 12/16/2019   TRIG 68 12/16/2019   CHOLHDL 2.3 11/29/2016   No results found for: TSH No results found for: HGBA1C No results found for: WBC, HGB, HCT, MCV, PLT Lab Results  Component Value Date   ALT 26 02/04/2020   AST 27 02/04/2020   ALKPHOS 71 02/04/2020   BILITOT 0.6 02/04/2020   No results found for: [place], VD25OH   Review of Systems  Patient Active Problem List   Diagnosis Date Noted   Arthralgia of hip 06/03/2018   Arthritis of foot 06/03/2018   Encounter for screening colonoscopy    Benign neoplasm of ascending colon    Polyp of sigmoid colon    Rectal polyp    Primary localized osteoarthrosis, hand 05/05/2017   Hyperlipidemia 11/29/2016   Degeneration, intervertebral disc, cervical 04/19/2016   Migraine without status migrainosus, not intractable 04/19/2016   Essential hypertension 04/19/2016   Cephalalgia 05/24/2014   Routine general medical examination at a health care facility 05/24/2014   Anxiety attack 05/24/2014   Arthritis 05/24/2014   Benign prostatic hyperplasia without lower urinary tract symptoms 05/24/2014   Narrowing of intervertebral disc space 05/24/2014   History of migraine headaches 05/24/2014   Personal history of other malignant neoplasm of skin 02/28/2012    Allergies  Allergen Reactions   Cephalexin     Past Surgical  History:  Procedure Laterality Date   BASAL CELL CARCINOMA EXCISION     arm and leg   COLONOSCOPY  2013   cleared for 5 yrs- Dr Allen Norris   COLONOSCOPY WITH PROPOFOL N/A 08/22/2017   Procedure: COLONOSCOPY WITH PROPOFOL;  Surgeon: Lucilla Lame, MD;  Location: [place];  Service: Endoscopy;  Laterality: N/A;   POLYPECTOMY  08/22/2017   Procedure: POLYPECTOMY INTESTINAL;  Surgeon: Lucilla Lame, MD;  Location: [place];  Service: Endoscopy;;   skin ca removed      Social History   Tobacco Use   Smoking status: Former    Packs/day: 1.00    Years: 29.00    Pack years: 29.00    Types: Cigarettes    Quit date: 1999    Years since quitting: 24.1   Smokeless tobacco: Never   Tobacco comments:    smoking cessation materials not required  Vaping Use   Vaping Use: Never used  Substance Use Topics   Alcohol use: Yes    Alcohol/week: 14.0 standard drinks    Types: 14 Glasses of wine per week   Drug use: No     Medication list has been reviewed and updated.  Current Meds  Medication Sig   amLODipine (NORVASC) 2.5 MG tablet Take 1 tablet (2.5 mg total) by mouth daily.   Apple Cider Vinegar 188 MG CAPS  Take by mouth.   Ascorbic Acid (VITAMIN C) 100 MG tablet Take by mouth.   B Complex Vitamins (VITAMIN B-COMPLEX) TABS Take by mouth.   Bee Pollen 1000 MG TABS Take by mouth.   Bilberry 100 MG CAPS Take by mouth.   Calcium Citrate 1040 MG TABS Take by mouth.   Cinnamon 500 MG capsule Take by mouth.   clotrimazole-betamethasone (LOTRISONE) cream Apply 1 application topically daily.   Coenzyme Q10 (COQ-10) 100 MG CAPS Take by mouth.   Cranberry 200 MG CAPS Take by mouth.   cyclobenzaprine (FLEXERIL) 10 MG tablet Take 1 tablet (10 mg total) by mouth 3 (three) times daily as needed. for muscle spams   cyclobenzaprine (FLEXERIL) 10 MG tablet Take 1 tablet (10 mg total) by mouth at bedtime as needed for muscle spasms.   Echinacea 500 MG CAPS Take by mouth.   etodolac  (LODINE) 500 MG tablet One twice a day   Flaxseed, Linseed, (FLAXSEED OIL PO) Take by mouth.   Garlic 6063 MG CAPS Take by mouth.   Glucosamine-Chondroitin (GLUCOSAMINE CHONDR COMPLEX PO) Take by mouth.   lisinopril (ZESTRIL) 20 MG tablet Take 1 tablet (20 mg total) by mouth daily.   meloxicam (MOBIC) 15 MG tablet Take 1 tablet (15 mg total) by mouth daily.   Multiple Vitamins-Minerals (MULTIVITAMIN WITH MINERALS) tablet Take 1 tablet by mouth daily.   propranolol ER (INDERAL LA) 80 MG 24 hr capsule Take 1 capsule (80 mg total) by mouth daily.   Shark Cartilage 500 MG CAPS Take by mouth.   tadalafil (CIALIS) 20 MG tablet Take one tablet PRN for erectile dysfunction   UNABLE TO FIND [employer] toxifier/regenerator herb    PHQ 2/9 Scores 12/18/2020 11/24/2020 06/16/2020 06/14/2020  PHQ - 2 Score 0 0 0 0  PHQ- 9 Score 2 0 0 -    GAD 7 : Generalized Anxiety Score 12/18/2020 11/24/2020 06/16/2020 11/10/2019  Nervous, Anxious, on Edge 1 0 0 0  Control/stop worrying 0 0 0 0  Worry too much - different things 0 0 0 0  Trouble relaxing 1 0 0 0  Restless 0 0 0 0  Easily annoyed or irritable 0 0 0 0  Afraid - awful might happen 0 0 0 0  Total GAD 7 Score 2 0 0 0  Anxiety Difficulty Not difficult at all - - -    BP Readings from Last 3 Encounters:  02/28/21 130/78  12/18/20 132/84  11/24/20 (!) 146/80    Physical Exam Vitals and nursing note reviewed.  Skin:    General: Skin is warm.     Findings: No ecchymosis, erythema or laceration.  Neurological:     Cranial Nerves: Cranial nerves 2-12 are intact.     Sensory: Sensation is intact.     Motor: Motor function is intact.     Deep Tendon Reflexes: Reflexes are normal and symmetric.    Wt Readings from Last 3 Encounters:  02/28/21 172 lb (78 kg)  12/18/20 176 lb (79.8 kg)  11/24/20 180 lb (81.6 kg)    BP 130/78    Pulse 68    Ht 5\' 9"  (1.753 m)    Wt 172 lb (78 kg)    BMI 25.40 kg/m   Assessment and Plan:  1. Contusion of left  eyelid, initial encounter New onset.  Stable.  Patient sustained 2 falls in the bathroom that there may have been water or surface and a second fall that may have involved an uneven brick.  Patient struck forehead above the left eye on both occasions.  Patient still has some paresthesia with anesthesia of a patch but this is likely to resolve.  Cranial nerve exam was normal motor and sensory exam normal reflexes were normal there is no residual concern.  We will continue to watch and if patient has any change in status such as headaches dizziness or focal neurologic concerns we will recheck.

## 2021-03-08 ENCOUNTER — Other Ambulatory Visit: Payer: Self-pay | Admitting: Family Medicine

## 2021-03-09 ENCOUNTER — Other Ambulatory Visit: Payer: Self-pay | Admitting: Family Medicine

## 2021-03-09 ENCOUNTER — Other Ambulatory Visit: Payer: Self-pay

## 2021-03-09 DIAGNOSIS — M5126 Other intervertebral disc displacement, lumbar region: Secondary | ICD-10-CM

## 2021-03-09 MED ORDER — MELOXICAM 15 MG PO TABS: 15.0000 mg | ORAL_TABLET | Freq: Every day | ORAL | 0 refills | Status: DC

## 2021-03-09 NOTE — Telephone Encounter (Signed)
Requested medication (s) are due for refill today: Pharmacy stated no refills available  Requested medication (s) are on the active medication list: yes    Last refill: 12/18/20 #90  1 refill  Future visit scheduled yes 06/20/21  Notes to clinic:Failed due to labs, please review.  Requested Prescriptions  Pending Prescriptions Disp Refills   meloxicam (MOBIC) 15 MG tablet 90 tablet 1    Sig: Take 1 tablet (15 mg total) by mouth daily.     Analgesics:  COX2 Inhibitors Failed - 03/09/2021  9:27 AM      Failed - Manual Review: Labs are only required if the patient has taken medication for more than 8 weeks.      Failed - HGB in normal range and within 360 days    No results found for: HGB, HGBKUC, HGBPOCKUC, HGBOTHER, TOTHGB, HGBPLASMA        Failed - Cr in normal range and within 360 days    Creatinine, Ser  Date Value Ref Range Status  12/16/2019 1.02 0.76 - 1.27 mg/dL Final          Failed - HCT in normal range and within 360 days    No results found for: HCT, HCTKUC, SRHCT        Failed - AST in normal range and within 360 days    AST  Date Value Ref Range Status  02/04/2020 27 0 - 40 IU/L Final          Failed - ALT in normal range and within 360 days    ALT  Date Value Ref Range Status  02/04/2020 26 0 - 44 IU/L Final          Failed - eGFR is 30 or above and within 360 days    GFR calc Af Amer  Date Value Ref Range Status  12/16/2019 86 >59 mL/min/1.73 Final    Comment:    **In accordance with recommendations from the NKF-ASN Task force,**   [facility] is in the process of updating its eGFR calculation to the   2021 CKD-EPI creatinine equation that estimates kidney function   without a race variable.    GFR calc non Af Amer  Date Value Ref Range Status  12/16/2019 74 >59 mL/min/1.73 Final          Passed - Patient is not pregnant      Passed - Valid encounter within last 12 months    Recent Outpatient Visits           1 week ago Contusion of left  eyelid, initial encounter   [place], MD   2 months ago Essential hypertension   [place], MD   3 months ago [facility] Juline Patch, MD   8 months ago Lumbar herniated disc   [facility] Juline Patch, MD   8 months ago Essential hypertension   [place], Deanna C, MD       Future Appointments             In 3 months Juline Patch, MD [facility], [facility]

## 2021-03-09 NOTE — Telephone Encounter (Signed)
Medication Refill - meloxicam 15mg  tablets   Has the patient contacted their pharmacy? Yes.    (Agent: If yes, when and what did the pharmacy advise?) Contact PCP office due to no refills being on file  Preferred Pharmacy (with phone number or street name): [facility] [address]  Has the patient been seen for an appointment in the last year OR does the patient have an upcoming appointment? Yes.    Agent: Please be advised that RX refills may take up to 3 business days. We ask that you follow-up with your pharmacy.

## 2021-03-13 DIAGNOSIS — D2272 Melanocytic nevi of left lower limb, including hip: Secondary | ICD-10-CM | POA: Diagnosis not present

## 2021-03-13 DIAGNOSIS — D485 Neoplasm of uncertain behavior of skin: Secondary | ICD-10-CM | POA: Diagnosis not present

## 2021-03-13 DIAGNOSIS — Z85828 Personal history of other malignant neoplasm of skin: Secondary | ICD-10-CM | POA: Diagnosis not present

## 2021-03-13 DIAGNOSIS — C44612 Basal cell carcinoma of skin of right upper limb, including shoulder: Secondary | ICD-10-CM | POA: Diagnosis not present

## 2021-03-13 DIAGNOSIS — D2261 Melanocytic nevi of right upper limb, including shoulder: Secondary | ICD-10-CM | POA: Diagnosis not present

## 2021-03-13 DIAGNOSIS — C44212 Basal cell carcinoma of skin of right ear and external auricular canal: Secondary | ICD-10-CM | POA: Diagnosis not present

## 2021-03-13 DIAGNOSIS — D2262 Melanocytic nevi of left upper limb, including shoulder: Secondary | ICD-10-CM | POA: Diagnosis not present

## 2021-03-13 DIAGNOSIS — L570 Actinic keratosis: Secondary | ICD-10-CM | POA: Diagnosis not present

## 2021-03-13 DIAGNOSIS — D225 Melanocytic nevi of trunk: Secondary | ICD-10-CM | POA: Diagnosis not present

## 2021-03-13 DIAGNOSIS — X32XXXA Exposure to sunlight, initial encounter: Secondary | ICD-10-CM | POA: Diagnosis not present

## 2021-03-13 DIAGNOSIS — D045 Carcinoma in situ of skin of trunk: Secondary | ICD-10-CM | POA: Diagnosis not present

## 2021-03-13 DIAGNOSIS — C44719 Basal cell carcinoma of skin of left lower limb, including hip: Secondary | ICD-10-CM | POA: Diagnosis not present

## 2021-03-26 DIAGNOSIS — Z6825 Body mass index (BMI) 25.0-25.9, adult: Secondary | ICD-10-CM | POA: Diagnosis not present

## 2021-03-26 DIAGNOSIS — C61 Malignant neoplasm of prostate: Secondary | ICD-10-CM | POA: Diagnosis not present

## 2021-04-18 DIAGNOSIS — L988 Other specified disorders of the skin and subcutaneous tissue: Secondary | ICD-10-CM | POA: Diagnosis not present

## 2021-04-18 DIAGNOSIS — L578 Other skin changes due to chronic exposure to nonionizing radiation: Secondary | ICD-10-CM | POA: Diagnosis not present

## 2021-04-18 DIAGNOSIS — H61111 Acquired deformity of pinna, right ear: Secondary | ICD-10-CM | POA: Diagnosis not present

## 2021-04-18 DIAGNOSIS — C44212 Basal cell carcinoma of skin of right ear and external auricular canal: Secondary | ICD-10-CM | POA: Diagnosis not present

## 2021-04-23 DIAGNOSIS — C44719 Basal cell carcinoma of skin of left lower limb, including hip: Secondary | ICD-10-CM | POA: Diagnosis not present

## 2021-04-23 DIAGNOSIS — L905 Scar conditions and fibrosis of skin: Secondary | ICD-10-CM | POA: Diagnosis not present

## 2021-05-10 DIAGNOSIS — D045 Carcinoma in situ of skin of trunk: Secondary | ICD-10-CM | POA: Diagnosis not present

## 2021-05-10 DIAGNOSIS — C44612 Basal cell carcinoma of skin of right upper limb, including shoulder: Secondary | ICD-10-CM | POA: Diagnosis not present

## 2021-06-15 ENCOUNTER — Telehealth: Payer: Self-pay | Admitting: Family Medicine

## 2021-06-15 NOTE — Telephone Encounter (Signed)
Spoke with patient.  He asked that someone contact him to reschedule appointment with dr Ronnald Ramp.   He would like his AWV with [facility] to be scheduled the same da as with Dr Ronnald Ramp.

## 2021-06-19 NOTE — Telephone Encounter (Signed)
Called patient regarding request to reschedule. Left msg for patient to contact the office at [phone].

## 2021-06-20 ENCOUNTER — Ambulatory Visit (INDEPENDENT_AMBULATORY_CARE_PROVIDER_SITE_OTHER): Payer: Medicare Other | Admitting: Family Medicine

## 2021-06-20 ENCOUNTER — Encounter: Payer: Self-pay | Admitting: Family Medicine

## 2021-06-20 ENCOUNTER — Ambulatory Visit: Payer: Medicare Other

## 2021-06-20 VITALS — BP 138/82 | HR 76 | Ht 69.0 in | Wt 173.0 lb

## 2021-06-20 DIAGNOSIS — G43909 Migraine, unspecified, not intractable, without status migrainosus: Secondary | ICD-10-CM

## 2021-06-20 DIAGNOSIS — I10 Essential (primary) hypertension: Secondary | ICD-10-CM | POA: Diagnosis not present

## 2021-06-20 DIAGNOSIS — E785 Hyperlipidemia, unspecified: Secondary | ICD-10-CM

## 2021-06-20 DIAGNOSIS — M5126 Other intervertebral disc displacement, lumbar region: Secondary | ICD-10-CM

## 2021-06-20 DIAGNOSIS — B356 Tinea cruris: Secondary | ICD-10-CM | POA: Diagnosis not present

## 2021-06-20 DIAGNOSIS — M5030 Other cervical disc degeneration, unspecified cervical region: Secondary | ICD-10-CM | POA: Diagnosis not present

## 2021-06-20 MED ORDER — MELOXICAM 15 MG PO TABS: 15.0000 mg | ORAL_TABLET | Freq: Every day | ORAL | 1 refills | Status: DC

## 2021-06-20 MED ORDER — CLOTRIMAZOLE-BETAMETHASONE 1-0.05 % EX CREA: 1.0000 "application " | TOPICAL_CREAM | Freq: Every day | CUTANEOUS | 2 refills | Status: DC

## 2021-06-20 MED ORDER — AMLODIPINE BESYLATE 2.5 MG PO TABS: 2.5000 mg | ORAL_TABLET | Freq: Every day | ORAL | 1 refills | Status: DC

## 2021-06-20 MED ORDER — LISINOPRIL 20 MG PO TABS: 20.0000 mg | ORAL_TABLET | Freq: Every day | ORAL | 1 refills | Status: DC

## 2021-06-20 MED ORDER — PROPRANOLOL HCL ER 80 MG PO CP24: 80.0000 mg | ORAL_CAPSULE | Freq: Every day | ORAL | 1 refills | Status: DC

## 2021-06-20 NOTE — Progress Notes (Signed)
Date:  06/20/2021   Name:  Marcus Gonzalez   DOB:  1950-11-10   MRN:  [id number]   Chief Complaint: Hypertension, Headache, Rash, and Arthritis  Hypertension This is a chronic problem. The current episode started more than 1 year ago. The problem has been gradually improving since onset. The problem is controlled. Associated symptoms include headaches. Pertinent negatives include no anxiety, blurred vision, chest pain, malaise/fatigue, neck pain, orthopnea, palpitations, peripheral edema, PND, shortness of breath or sweats. There are no associated agents to hypertension. Past treatments include ACE inhibitors, calcium channel blockers and beta blockers. There is no history of angina, kidney disease, CAD/MI, CVA, heart failure, left ventricular hypertrophy, PVD or retinopathy. There is no history of chronic renal disease, a hypertension causing med or renovascular disease.  Headache  This is a chronic problem. The current episode started more than 1 year ago. The problem has been gradually improving. Pertinent negatives include no abdominal pain, back pain, blurred vision, coughing, dizziness, ear pain, fever, nausea, neck pain or sore throat. His past medical history is significant for hypertension.  Rash This is a chronic problem. The current episode started more than 1 year ago. The problem has been gradually improving since onset. Pertinent negatives include no congestion, cough, diarrhea, fever, shortness of breath or sore throat. The treatment provided moderate relief.  Arthritis Associated symptoms include rash. Pertinent negatives include no diarrhea, dysuria or fever.   Lab Results  Component Value Date   NA 141 12/16/2019   K 4.8 12/16/2019   CO2 24 12/16/2019   GLUCOSE 102 (H) 12/16/2019   BUN 11 12/16/2019   CREATININE 1.02 12/16/2019   CALCIUM 9.6 12/16/2019   GFRNONAA 74 12/16/2019   Lab Results  Component Value Date   CHOL 185 12/16/2019   HDL 65 12/16/2019   LDLCALC  107 (H) 12/16/2019   TRIG 68 12/16/2019   CHOLHDL 2.3 11/29/2016   No results found for: TSH No results found for: HGBA1C No results found for: WBC, HGB, HCT, MCV, PLT Lab Results  Component Value Date   ALT 26 02/04/2020   AST 27 02/04/2020   ALKPHOS 71 02/04/2020   BILITOT 0.6 02/04/2020   No results found for: 25OHVITD2, 25OHVITD3, VD25OH   Review of Systems  Constitutional:  Negative for chills, fever and malaise/fatigue.  HENT:  Negative for congestion, drooling, ear discharge, ear pain and sore throat.   Eyes:  Negative for blurred vision.  Respiratory:  Negative for cough, shortness of breath and wheezing.   Cardiovascular:  Negative for chest pain, palpitations, orthopnea, leg swelling and PND.  Gastrointestinal:  Negative for abdominal pain, blood in stool, constipation, diarrhea and nausea.  Endocrine: Negative for polydipsia.  Genitourinary:  Negative for dysuria, frequency, hematuria and urgency.  Musculoskeletal:  Positive for arthritis. Negative for back pain, myalgias and neck pain.  Skin:  Positive for rash.  Allergic/Immunologic: Negative for environmental allergies.  Neurological:  Positive for headaches. Negative for dizziness.  Hematological:  Does not bruise/bleed easily.  Psychiatric/Behavioral:  Negative for suicidal ideas. The patient is not nervous/anxious.    Patient Active Problem List   Diagnosis Date Noted   Arthralgia of hip 06/03/2018   Arthritis of foot 06/03/2018   Encounter for screening colonoscopy    Benign neoplasm of ascending colon    Polyp of sigmoid colon    Rectal polyp    Primary localized osteoarthrosis, hand 05/05/2017   Hyperlipidemia 11/29/2016   Degeneration, intervertebral disc, cervical 04/19/2016  Migraine without status migrainosus, not intractable 04/19/2016   Essential hypertension 04/19/2016   Cephalalgia 05/24/2014   Routine general medical examination at a health care facility 05/24/2014   Anxiety attack  05/24/2014   Arthritis 05/24/2014   Benign prostatic hyperplasia without lower urinary tract symptoms 05/24/2014   Narrowing of intervertebral disc space 05/24/2014   History of migraine headaches 05/24/2014   Personal history of other malignant neoplasm of skin 02/28/2012    Allergies  Allergen Reactions   Cephalexin     Past Surgical History:  Procedure Laterality Date   BASAL CELL CARCINOMA EXCISION     arm and leg   COLONOSCOPY  2013   cleared for 5 yrs- Dr Allen Norris   COLONOSCOPY WITH PROPOFOL N/A 08/22/2017   Procedure: COLONOSCOPY WITH PROPOFOL;  Surgeon: Lucilla Lame, MD;  Location: [place];  Service: Endoscopy;  Laterality: N/A;   POLYPECTOMY  08/22/2017   Procedure: POLYPECTOMY INTESTINAL;  Surgeon: Lucilla Lame, MD;  Location: [place];  Service: Endoscopy;;   skin ca removed      Social History   Tobacco Use   Smoking status: Former    Packs/day: 1.00    Years: 29.00    Pack years: 29.00    Types: Cigarettes    Quit date: 1999    Years since quitting: 24.4   Smokeless tobacco: Never   Tobacco comments:    smoking cessation materials not required  Vaping Use   Vaping Use: Never used  Substance Use Topics   Alcohol use: Yes    Alcohol/week: 14.0 standard drinks    Types: 14 Glasses of wine per week   Drug use: No     Medication list has been reviewed and updated.  Current Meds  Medication Sig   amLODipine (NORVASC) 2.5 MG tablet Take 1 tablet (2.5 mg total) by mouth daily.   Apple Cider Vinegar 188 MG CAPS Take by mouth.   Ascorbic Acid (VITAMIN C) 100 MG tablet Take by mouth.   B Complex Vitamins (VITAMIN B-COMPLEX) TABS Take by mouth.   Bee Pollen 1000 MG TABS Take by mouth.   Bilberry 100 MG CAPS Take by mouth.   Calcium Citrate 1040 MG TABS Take by mouth.   Cinnamon 500 MG capsule Take by mouth.   clotrimazole-betamethasone (LOTRISONE) cream Apply 1 application topically daily.   Coenzyme Q10 (COQ-10) 100 MG CAPS Take by  mouth.   Cranberry 200 MG CAPS Take by mouth.   cyclobenzaprine (FLEXERIL) 10 MG tablet Take 1 tablet (10 mg total) by mouth 3 (three) times daily as needed. for muscle spams   cyclobenzaprine (FLEXERIL) 10 MG tablet Take 1 tablet (10 mg total) by mouth at bedtime as needed for muscle spasms.   Echinacea 500 MG CAPS Take by mouth.   Flaxseed, Linseed, (FLAXSEED OIL PO) Take by mouth.   Garlic 4132 MG CAPS Take by mouth.   Glucosamine-Chondroitin (GLUCOSAMINE CHONDR COMPLEX PO) Take by mouth.   lisinopril (ZESTRIL) 20 MG tablet Take 1 tablet (20 mg total) by mouth daily.   meloxicam (MOBIC) 15 MG tablet Take 1 tablet (15 mg total) by mouth daily.   Multiple Vitamins-Minerals (MULTIVITAMIN WITH MINERALS) tablet Take 1 tablet by mouth daily.   propranolol ER (INDERAL LA) 80 MG 24 hr capsule Take 1 capsule (80 mg total) by mouth daily.   Shark Cartilage 500 MG CAPS Take by mouth.   tadalafil (CIALIS) 20 MG tablet Take one tablet PRN for erectile dysfunction   UNABLE TO  FIND [employer] toxifier/regenerator herb   [DISCONTINUED] etodolac (LODINE) 500 MG tablet One twice a day       06/20/2021    9:32 AM 12/18/2020    8:09 AM 11/24/2020   10:43 AM 06/16/2020    8:29 AM  GAD 7 : Generalized Anxiety Score  Nervous, Anxious, on Edge 1 1 0 0  Control/stop worrying 0 0 0 0  Worry too much - different things 0 0 0 0  Trouble relaxing 0 1 0 0  Restless 0 0 0 0  Easily annoyed or irritable 1 0 0 0  Afraid - awful might happen 0 0 0 0  Total GAD 7 Score 2 2 0 0  Anxiety Difficulty Not difficult at all Not difficult at all         06/20/2021    9:32 AM  Depression screen PHQ 2/9  Decreased Interest 0  Down, Depressed, Hopeless 1  PHQ - 2 Score 1  Altered sleeping 2  Tired, decreased energy 0  Change in appetite 0  Feeling bad or failure about yourself  0  Trouble concentrating 0  Moving slowly or fidgety/restless 0  Suicidal thoughts 0  PHQ-9 Score 3  Difficult doing work/chores Not  difficult at all    BP Readings from Last 3 Encounters:  06/20/21 138/82  02/28/21 130/78  12/18/20 132/84    Physical Exam Vitals and nursing note reviewed.  Constitutional:      Appearance: Normal appearance.  HENT:     Head: Normocephalic.     Right Ear: External ear normal.     Left Ear: External ear normal.     Nose: Nose normal.  Eyes:     General: No scleral icterus.       Right eye: No discharge.        Left eye: No discharge.     Conjunctiva/sclera: Conjunctivae normal.     Pupils: Pupils are equal, round, and reactive to light.  Neck:     Thyroid: No thyroid mass or thyromegaly.     Vascular: No JVD.     Trachea: No tracheal deviation.  Cardiovascular:     Rate and Rhythm: Normal rate and regular rhythm.     Heart sounds: Normal heart sounds, S1 normal and S2 normal. No murmur heard. No systolic murmur is present.  No diastolic murmur is present.    No friction rub. No gallop. No S3 or S4 sounds.  Pulmonary:     Effort: No respiratory distress.     Breath sounds: Normal breath sounds. No wheezing, rhonchi or rales.  Abdominal:     General: Bowel sounds are normal.     Palpations: Abdomen is soft. There is no mass.     Tenderness: There is no abdominal tenderness. There is no guarding or rebound.  Musculoskeletal:        General: No tenderness. Normal range of motion.     Cervical back: Normal range of motion and neck supple.  Lymphadenopathy:     Cervical: No cervical adenopathy.  Skin:    General: Skin is warm.     Findings: No rash.  Neurological:     Mental Status: He is alert.    Wt Readings from Last 3 Encounters:  06/20/21 173 lb (78.5 kg)  02/28/21 172 lb (78 kg)  12/18/20 176 lb (79.8 kg)    BP 138/82   Pulse 76   Ht '5\' 9"'$  (1.753 m)   Wt 173 lb (78.5 kg)  BMI 25.55 kg/m   Assessment and Plan:  1. Essential hypertension Chronic.  Controlled.  Stable.  Blood pressure today is 138/82.  Continue lisinopril 20 mg once a day,  propranolol ER 80 mg, and amlodipine 2.5 mg once a day.  Will check CMP for electrolytes and GFR.  We will recheck in 6 months. - lisinopril (ZESTRIL) 20 MG tablet; Take 1 tablet (20 mg total) by mouth daily.  Dispense: 90 tablet; Refill: 1 - propranolol ER (INDERAL LA) 80 MG 24 hr capsule; Take 1 capsule (80 mg total) by mouth daily.  Dispense: 90 capsule; Refill: 1 - Comprehensive Metabolic Panel (CMET) - amLODipine (NORVASC) 2.5 MG tablet; Take 1 tablet (2.5 mg total) by mouth daily.  Dispense: 90 tablet; Refill: 1  2. Degeneration, intervertebral disc, cervical Chronic.  Controlled.  Stable.  Continue with current therapy  3. Migraine without status migrainosus, not intractable, unspecified migraine type Chronic.  Controlled.  Stable.  Prophylaxis with propranolol ER 80 mg will be continued. - propranolol ER (INDERAL LA) 80 MG 24 hr capsule; Take 1 capsule (80 mg total) by mouth daily.  Dispense: 90 capsule; Refill: 1  4. Tinea inguinalis Chronic episodic stable.  Rash that comes and goes and will treat with Lotrisone on a as needed basis - clotrimazole-betamethasone (LOTRISONE) cream; Apply 1 application. topically daily.  Dispense: 30 g; Refill: 2  5. Lumbar herniated disc Chronic.  Controlled.  Stable.  Continue meloxicam 15 mg once a day. - meloxicam (MOBIC) 15 MG tablet; Take 1 tablet (15 mg total) by mouth daily.  Dispense: 90 tablet; Refill: 1  6. Hyperlipidemia, unspecified hyperlipidemia type Chronic.  Persistent.  Stable.  No palpable liver edge at this time.  We will continue with dietary approach and will check lipid panel. - Lipid Panel With LDL/HDL Ratio

## 2021-06-21 LAB — LIPID PANEL WITH LDL/HDL RATIO
Cholesterol, Total: 162 mg/dL (ref 100–199)
HDL: 68 mg/dL (ref >39)
LDL Chol Calc (NIH): 78 mg/dL (ref 0–99)
LDL/HDL Ratio: 1.1 ratio (ref 0.0–3.6)
Triglycerides: 85 mg/dL (ref 0–149)
VLDL Cholesterol Cal: 16 mg/dL (ref 5–40)

## 2021-06-21 LAB — COMPREHENSIVE METABOLIC PANEL
ALT: 22 IU/L (ref 0–44)
AST: 24 IU/L (ref 0–40)
Albumin/Globulin Ratio: 1.5 (ref 1.2–2.2)
Albumin: 4.4 g/dL (ref 3.7–4.7)
Alkaline Phosphatase: 66 IU/L (ref 44–121)
BUN/Creatinine Ratio: 14 (ref 10–24)
BUN: 13 mg/dL (ref 8–27)
Bilirubin Total: 0.6 mg/dL (ref 0.0–1.2)
CO2: 23 mmol/L (ref 20–29)
Calcium: 10 mg/dL (ref 8.6–10.2)
Chloride: 102 mmol/L (ref 96–106)
Creatinine, Ser: 0.94 mg/dL (ref 0.76–1.27)
Globulin, Total: 2.9 g/dL (ref 1.5–4.5)
Glucose: 91 mg/dL (ref 70–99)
Potassium: 4.8 mmol/L (ref 3.5–5.2)
Sodium: 140 mmol/L (ref 134–144)
Total Protein: 7.3 g/dL (ref 6.0–8.5)
eGFR: 87 mL/min/{1.73_m2} (ref >59)

## 2021-07-16 ENCOUNTER — Ambulatory Visit (INDEPENDENT_AMBULATORY_CARE_PROVIDER_SITE_OTHER): Payer: Medicare Other

## 2021-07-16 DIAGNOSIS — Z0000 Encounter for general adult medical examination without abnormal findings: Secondary | ICD-10-CM

## 2021-07-16 NOTE — Patient Instructions (Addendum)
Mr. Marcus Gonzalez , Thank you for taking time to come for your Medicare Wellness Visit. I appreciate your ongoing commitment to your health goals. Please review the following plan we discussed and let me know if I can assist you in the future.   Screening recommendations/referrals: Colonoscopy: done 08/22/17. Repeat 07/2022 Recommended yearly ophthalmology/optometry visit for glaucoma screening and checkup Recommended yearly dental visit for hygiene and checkup  Vaccinations: Influenza vaccine: done 11/07/20 Pneumococcal vaccine: done 06/03/18 Tdap vaccine: done 06/16/20 Shingles vaccine: done 12/26/18 & 04/10/19   Covid-19: done 02/22/19, 03/15/19, 11/01/19, 06/09/20 & 11/03/20  Advanced directives: Please bring a copy of your health care power of attorney and living will to the office at your convenience.   Conditions/risks identified: Keep up the great work!  Next appointment: Follow up in one year for your annual wellness visit.   Preventive Care 72 Years and Older, Male Preventive care refers to lifestyle choices and visits with your health care provider that can promote health and wellness. What does preventive care include? A yearly physical exam. This is also called an annual well check. Dental exams once or twice a year. Routine eye exams. Ask your health care provider how often you should have your eyes checked. Personal lifestyle choices, including: Daily care of your teeth and gums. Regular physical activity. Eating a healthy diet. Avoiding tobacco and drug use. Limiting alcohol use. Practicing safe sex. Taking low doses of aspirin every day. Taking vitamin and mineral supplements as recommended by your health care provider. What happens during an annual well check? The services and screenings done by your health care provider during your annual well check will depend on your age, overall health, lifestyle risk factors, and family history of disease. Counseling  Your health care  provider may ask you questions about your: Alcohol use. Tobacco use. Drug use. Emotional well-being. Home and relationship well-being. Sexual activity. Eating habits. History of falls. Memory and ability to understand (cognition). Work and work Statistician. Screening  You may have the following tests or measurements: Height, weight, and BMI. Blood pressure. Lipid and cholesterol levels. These may be checked every 5 years, or more frequently if you are over 72 years old. Skin check. Lung cancer screening. You may have this screening every year starting at age 72 if you have a 30-pack-year history of smoking and currently smoke or have quit within the past 15 years. Fecal occult blood test (FOBT) of the stool. You may have this test every year starting at age 72. Flexible sigmoidoscopy or colonoscopy. You may have a sigmoidoscopy every 5 years or a colonoscopy every 10 years starting at age 72. Prostate cancer screening. Recommendations will vary depending on your family history and other risks. Hepatitis C blood test. Hepatitis B blood test. Sexually transmitted disease (STD) testing. Diabetes screening. This is done by checking your blood sugar (glucose) after you have not eaten for a while (fasting). You may have this done every 1-3 years. Abdominal aortic aneurysm (AAA) screening. You may need this if you are a current or former smoker. Osteoporosis. You may be screened starting at age 62 if you are at high risk. Talk with your health care provider about your test results, treatment options, and if necessary, the need for more tests. Vaccines  Your health care provider may recommend certain vaccines, such as: Influenza vaccine. This is recommended every year. Tetanus, diphtheria, and acellular pertussis (Tdap, Td) vaccine. You may need a Td booster every 10 years. Zoster vaccine. You may need  this after age 73. Pneumococcal 13-valent conjugate (PCV13) vaccine. One dose is  recommended after age 30. Pneumococcal polysaccharide (PPSV23) vaccine. One dose is recommended after age 1. Talk to your health care provider about which screenings and vaccines you need and how often you need them. This information is not intended to replace advice given to you by your health care provider. Make sure you discuss any questions you have with your health care provider. Document Released: 02/10/2015 Document Revised: 10/04/2015 Document Reviewed: 11/15/2014 Elsevier Interactive Patient Education  2017 [place] Prevention in the Home Falls can cause injuries. They can happen to people of all ages. There are many things you can do to make your home safe and to help prevent falls. What can I do on the outside of my home? Regularly fix the edges of walkways and driveways and fix any cracks. Remove anything that might make you trip as you walk through a door, such as a raised step or threshold. Trim any bushes or trees on the path to your home. Use bright outdoor lighting. Clear any walking paths of anything that might make someone trip, such as rocks or tools. Regularly check to see if handrails are loose or broken. Make sure that both sides of any steps have handrails. Any raised decks and porches should have guardrails on the edges. Have any leaves, snow, or ice cleared regularly. Use sand or salt on walking paths during winter. Clean up any spills in your garage right away. This includes oil or grease spills. What can I do in the bathroom? Use night lights. Install grab bars by the toilet and in the tub and shower. Do not use towel bars as grab bars. Use non-skid mats or decals in the tub or shower. If you need to sit down in the shower, use a plastic, non-slip stool. Keep the floor dry. Clean up any water that spills on the floor as soon as it happens. Remove soap buildup in the tub or shower regularly. Attach bath mats securely with double-sided non-slip rug  tape. Do not have throw rugs and other things on the floor that can make you trip. What can I do in the bedroom? Use night lights. Make sure that you have a light by your bed that is easy to reach. Do not use any sheets or blankets that are too big for your bed. They should not hang down onto the floor. Have a firm chair that has side arms. You can use this for support while you get dressed. Do not have throw rugs and other things on the floor that can make you trip. What can I do in the kitchen? Clean up any spills right away. Avoid walking on wet floors. Keep items that you use a lot in easy-to-reach places. If you need to reach something above you, use a strong step stool that has a grab bar. Keep electrical cords out of the way. Do not use floor polish or wax that makes floors slippery. If you must use wax, use non-skid floor wax. Do not have throw rugs and other things on the floor that can make you trip. What can I do with my stairs? Do not leave any items on the stairs. Make sure that there are handrails on both sides of the stairs and use them. Fix handrails that are broken or loose. Make sure that handrails are as long as the stairways. Check any carpeting to make sure that it is firmly attached to  the stairs. Fix any carpet that is loose or worn. Avoid having throw rugs at the top or bottom of the stairs. If you do have throw rugs, attach them to the floor with carpet tape. Make sure that you have a light switch at the top of the stairs and the bottom of the stairs. If you do not have them, ask someone to add them for you. What else can I do to help prevent falls? Wear shoes that: Do not have high heels. Have rubber bottoms. Are comfortable and fit you well. Are closed at the toe. Do not wear sandals. If you use a stepladder: Make sure that it is fully opened. Do not climb a closed stepladder. Make sure that both sides of the stepladder are locked into place. Ask someone to  hold it for you, if possible. Clearly mark and make sure that you can see: Any grab bars or handrails. First and last steps. Where the edge of each step is. Use tools that help you move around (mobility aids) if they are needed. These include: Canes. Walkers. Scooters. Crutches. Turn on the lights when you go into a dark area. Replace any light bulbs as soon as they burn out. Set up your furniture so you have a clear path. Avoid moving your furniture around. If any of your floors are uneven, fix them. If there are any pets around you, be aware of where they are. Review your medicines with your doctor. Some medicines can make you feel dizzy. This can increase your chance of falling. Ask your doctor what other things that you can do to help prevent falls. This information is not intended to replace advice given to you by your health care provider. Make sure you discuss any questions you have with your health care provider. Document Released: 11/10/2008 Document Revised: 06/22/2015 Document Reviewed: 02/18/2014 Elsevier Interactive Patient Education  2017 [employer].

## 2021-07-16 NOTE — Progress Notes (Signed)
Subjective:   Marcus Gonzalez is a 71 y.o. male who presents for Medicare Annual/Subsequent preventive examination.  Virtual Visit via Telephone Note  I connected with  Marcus Gonzalez on 07/16/21 at  1:00 PM EDT by telephone and verified that I am speaking with the correct person using two identifiers.  Location: Patient: home Provider: [facility] Persons participating in the virtual visit: [place]   I discussed the limitations, risks, security and privacy concerns of performing an evaluation and management service by telephone and the availability of in person appointments. The patient expressed understanding and agreed to proceed.  Interactive audio and video telecommunications were attempted between this nurse and patient, however failed, due to patient having technical difficulties OR patient did not have access to video capability.  We continued and completed visit with audio only.  Some vital signs may be absent or patient reported.   Clemetine Marker, LPN   Review of Systems    Cardiac Risk Factors include: advanced age (>29mn, >>35women);male gender;hypertension     Objective:    There were no vitals filed for this visit. There is no height or weight on file to calculate BMI.     07/16/2021    2:30 PM 06/14/2020    8:22 AM 06/03/2018    8:37 AM 12/05/2017    8:56 AM 08/22/2017    7:37 AM 05/28/2017    8:34 AM 10/07/2014    8:34 AM  Advanced Directives  Does Patient Have a Medical Advance Directive? Yes Yes Yes Yes Yes Yes Yes  Type of AParamedicof ASciotaLiving will HBarn[place]Living will Living will;Healthcare Power of ASouth [place]Living will HEle[place]Living will HSalineLiving will HSan MiguelLiving will  Does patient want to make changes to medical advance directive?    No - Patient declined No - Patient declined    Copy of  HEstellinein Chart? No - copy requested No - copy requested No - copy requested No - copy requested No - copy requested No - copy requested No - copy requested    Current Medications (verified) Outpatient Encounter Medications as of 07/16/2021  Medication Sig   amLODipine (NORVASC) 2.5 MG tablet Take 1 tablet (2.5 mg total) by mouth daily.   Apple Cider Vinegar 188 MG CAPS Take by mouth.   Ascorbic Acid (VITAMIN C) 100 MG tablet Take by mouth.   B Complex Vitamins (VITAMIN B-COMPLEX) TABS Take by mouth.   Bee Pollen 1000 MG TABS Take by mouth.   Bilberry 100 MG CAPS Take by mouth.   Calcium Citrate 1040 MG TABS Take by mouth.   Cinnamon 500 MG capsule Take by mouth.   clotrimazole-betamethasone (LOTRISONE) cream Apply 1 application. topically daily.   Coenzyme Q10 (COQ-10) 100 MG CAPS Take by mouth.   Cranberry 200 MG CAPS Take by mouth.   cyclobenzaprine (FLEXERIL) 10 MG tablet Take 1 tablet (10 mg total) by mouth at bedtime as needed for muscle spasms.   Echinacea 500 MG CAPS Take by mouth.   Flaxseed, Linseed, (FLAXSEED OIL PO) Take by mouth.   Garlic 17408MG CAPS Take by mouth.   Glucosamine-Chondroitin (GLUCOSAMINE CHONDR COMPLEX PO) Take by mouth.   lisinopril (ZESTRIL) 20 MG tablet Take 1 tablet (20 mg total) by mouth daily.   meloxicam (MOBIC) 15 MG tablet Take 1 tablet (15 mg total) by mouth daily.   Multiple Vitamins-Minerals (MULTIVITAMIN  WITH MINERALS) tablet Take 1 tablet by mouth daily.   propranolol ER (INDERAL LA) 80 MG 24 hr capsule Take 1 capsule (80 mg total) by mouth daily.   Shark Cartilage 500 MG CAPS Take by mouth.   tadalafil (CIALIS) 20 MG tablet Take one tablet PRN for erectile dysfunction   UNABLE TO FIND [employer] toxifier/regenerator herb   [DISCONTINUED] cyclobenzaprine (FLEXERIL) 10 MG tablet Take 1 tablet (10 mg total) by mouth 3 (three) times daily as needed. for muscle spams   No facility-administered encounter medications on file as of  07/16/2021.    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   COLONOSCOPY  2013   cleared for 5 yrs- Dr Allen Norris   COLONOSCOPY WITH PROPOFOL N/A 08/22/2017   Procedure: COLONOSCOPY WITH PROPOFOL;  Surgeon: Lucilla Lame, MD;  Location: [facility];  Service: Endoscopy;  Laterality: N/A;   POLYPECTOMY  08/22/2017   Procedure: POLYPECTOMY INTESTINAL;  Surgeon: Lucilla Lame, MD;  Location: [place];  Service: Endoscopy;;   skin ca removed     Family History  Problem Relation Age of Onset   Heart disease Father    Diabetes Maternal Aunt    Cancer Maternal Grandmother        colon cancer   Heart disease Mother    Dementia Mother    Other Brother        "bladder issues"   Social History   Socioeconomic History   Marital status: Single    Spouse name: Not on file   Number of children: 0   Years of education: Not on file   Highest education level: Doctorate  Occupational History    Employer: [employer]   Tobacco Use   Smoking status: Former    Packs/day: 1.00    Years: 29.00    Total pack years: 29.00    Types: Cigarettes    Quit date: 1999    Years since quitting: 24.4   Smokeless tobacco: Never   Tobacco comments:    smoking cessation materials not required  Vaping Use   Vaping Use: Never used  Substance and Sexual Activity   Alcohol use: Yes    Alcohol/week: 14.0 standard drinks of alcohol    Types: 14 Glasses of wine per week   Drug use: No   Sexual activity: Not Currently  Other Topics Concern   Not on file  Social History Narrative   Not on file   Social Determinants of Health   Financial Resource Strain: Low Risk  (07/16/2021)   Overall Financial Resource Strain  (CARDIA)    Difficulty of Paying Living Expenses: Not hard at all  Food Insecurity: No Food Insecurity (07/16/2021)   Hunger Vital Sign    Worried About Running Out of Food in the Last Year: Never true    Ran Out of Food in the Last Year: Never true  Transportation Needs: No Transportation Needs (07/16/2021)   PRAPARE - Hydrologist (Medical): No    Lack of Transportation (Non-Medical): No  Physical Activity: Sufficiently Active (07/16/2021)   Exercise Vital Sign    Days of Exercise per Week: 3  days    Minutes of Exercise per Session: 60 min  Stress: No Stress Concern Present (07/16/2021)   [place]    Feeling of Stress : Not at all  Social Connections: Moderately Isolated (07/16/2021)   Social Connection and Isolation Panel [NHANES]    Frequency of Communication with Friends and Family: More than three times a week    Frequency of Social Gatherings with Friends and Family: More than three times a week    Attends Religious Services: More than 4 times per year    Active Member of [employer] or Organizations: No    Attends Music therapist: Never    Marital Status: Never married    Tobacco Counseling Counseling given: Not Answered Tobacco comments: smoking cessation materials not required   Clinical Intake:  Pre-visit preparation completed: Yes  Pain : No/denies pain     Nutritional Risks: None Diabetes: No  How often do you need to have someone help you when you read instructions, pamphlets, or other written materials from your doctor or pharmacy?: 1 - Never    Interpreter Needed?: No  Information entered by :: Clemetine Marker LPN   Activities of Daily Living    07/16/2021    1:05 PM 12/18/2020    8:12 AM  In your present state of health, do you have any difficulty performing the following activities:  Hearing? 0 0  Vision? 0 0  Difficulty concentrating or making  decisions? 0 0  Walking or climbing stairs? 0 0  Dressing or bathing? 0 0  Doing errands, shopping? 0 0  Preparing Food and eating ? N   Using the Toilet? N   In the past six months, have you accidently leaked urine? N   Do you have problems with loss of bowel control? N   Managing your Medications? N   Managing your Finances? N   Housekeeping or managing your Housekeeping? N     Patient Care Team: Juline Patch, MD as PCP - General (Family Medicine) Dasher, Rayvon Char, MD as Consulting Physician (Dermatology) Lacinda Axon Tedra Coupe, MD as Consulting Physician (Dermatology) Samara Deist, DPM as Referring Physician (Podiatry)  Indicate any recent Medical Services you may have received from other than [facility] providers in the past year (date may be approximate).     Assessment:   This is a routine wellness examination for [place].  Hearing/Vision screen Hearing Screening - Comments::  Pt denies hearing difficulty Vision Screening - Comments:: Annual vision screenings with Dr. Mallie Mussel; planning to switch to Dr. Atilano Median in [place] issues and exercise activities discussed: Current Exercise Habits: Home exercise routine, Type of exercise: strength training/weights;Other - see comments (elliptical), Time (Minutes): 60, Frequency (Times/Week): 3, Weekly Exercise (Minutes/Week): 180, Intensity: Moderate, Exercise limited by: None identified   Goals Addressed             This Visit's Progress    DIET - INCREASE WATER INTAKE   On track    Recommend to drink at least 6-8 8oz glasses of water per day.       Depression Screen    07/16/2021    1:03 PM 06/20/2021    9:32 AM 12/18/2020    8:09 AM 11/24/2020   10:43 AM 06/16/2020    8:28 AM 06/14/2020    8:21 AM 11/10/2019   11:20 AM  PHQ 2/9 Scores  PHQ - 2 Score 0 1 0 0 0 0 0  PHQ- 9 Score  $'1 3 2 'h$ 0 0  0    Fall Risk    07/16/2021    1:04 PM 06/20/2021    9:33 AM 02/28/2021   11:09 AM 12/18/2020    8:12 AM 06/14/2020    8:23  AM  Fall Risk   Falls in the past year? 1 0 1 0 0  Number falls in past yr: 1 0 1 0 0  Comment fell twice on vacation      Injury with Fall? 0 0 0 0 0  Comment   black eye    Risk for fall due to : No Fall Risks No Fall Risks History of fall(s);Impaired mobility No Fall Risks No Fall Risks  Follow up Falls prevention discussed Falls evaluation completed Falls evaluation completed Falls evaluation completed Falls prevention discussed    FALL RISK PREVENTION PERTAINING TO THE HOME:  Any stairs in or around the home? Yes  If so, are there any without handrails? No  Home free of loose throw rugs in walkways, pet beds, electrical cords, etc? Yes  Adequate lighting in your home to reduce risk of falls? Yes   ASSISTIVE DEVICES UTILIZED TO PREVENT FALLS:  Life alert? No  Use of a cane, walker or w/c? No  Grab bars in the bathroom? No  Shower chair or bench in shower? No  Elevated toilet seat or a handicapped toilet? No   TIMED UP AND GO:  Was the test performed? No . Telephonic visit.   Cognitive Function: Normal cognitive status assessed by direct observation by this Nurse Health Advisor. No abnormalities found.          06/03/2018    8:41 AM 12/05/2017    8:52 AM 05/28/2017    8:19 AM  6CIT Screen  What Year? 0 points 0 points 0 points  What month? 0 points 0 points 0 points  What time? 0 points 0 points 0 points  Count back from 20 0 points 0 points 0 points  Months in reverse 0 points 0 points 0 points  Repeat phrase 0 points 0 points 0 points  Total Score 0 points 0 points 0 points    Immunizations Immunization History  Administered Date(s) Administered   Fluad Quad(high Dose 65+) 11/05/2018, 11/10/2019, 11/07/2020   Influenza, High Dose Seasonal PF 11/29/2016   Influenza, Seasonal, Injecte, Preservative Fre 11/06/2010   Influenza,inj,Quad PF,6+ Mos 11/23/2012, 10/29/2013, 11/22/2014, 11/13/2015   Influenza-Unspecified 10/29/2013, 10/28/2016, 11/28/2017   PFIZER  Comirnaty(Gray Top)Covid-19 Tri-Sucrose Vaccine 02/22/2019, 03/15/2019   PFIZER(Purple Top)SARS-COV-2 Vaccination 11/01/2019, 06/09/2020   Pfizer Covid-19 Vaccine Bivalent Booster 76yr & up 11/03/2020   Pneumococcal Conjugate-13 05/28/2017   Pneumococcal Polysaccharide-23 06/03/2018   Td 06/16/2020   Tdap 04/03/2010   Zoster Recombinat (Shingrix) 12/26/2018, 04/10/2019    TDAP status: Up to date  Flu Vaccine status: Up to date  Pneumococcal vaccine status: Up to date  Covid-19 vaccine status: Completed vaccines  Qualifies for Shingles Vaccine? Yes   Zostavax completed No   Shingrix Completed?: Yes  Screening Tests Health Maintenance  Topic Date Due   INFLUENZA VACCINE  08/28/2021   COLONOSCOPY (Pts 45-470yrInsurance coverage will need to be confirmed)  08/23/2022   TETANUS/TDAP  06/17/2030   Pneumonia Vaccine 6517Years old  Completed   COVID-19 Vaccine  Completed   Zoster Vaccines- Shingrix  Completed   HPV VACCINES  Aged Out   Hepatitis C Screening  Discontinued    Health Maintenance  There are no preventive care reminders to display  for this patient.  Colorectal cancer screening: Type of screening: Colonoscopy. Completed 08/22/17. Repeat every 5 years  Lung Cancer Screening: (Low Dose CT Chest recommended if Age 84-80 years, 30 pack-year currently smoking OR have quit w/in 15years.) does not qualify.   Additional Screening:  Hepatitis C Screening: does qualify; due  Vision Screening: Recommended annual ophthalmology exams for early detection of glaucoma and other disorders of the eye. Is the patient up to date with their annual eye exam?  Yes Who is the provider or what is the name of the office in which the patient attends annual eye exams? Dr. Mallie Mussel  Dental Screening: Recommended annual dental exams for proper oral hygiene  Community Resource Referral / Chronic Care Management: CRR required this visit?  No   CCM required this visit?  No      Plan:      I have personally reviewed and noted the following in the patient's chart:   Medical and social history Use of alcohol, tobacco or illicit drugs  Current medications and supplements including opioid prescriptions. Patient is not currently taking opioid prescriptions. Functional ability and status Nutritional status Physical activity Advanced directives List of other physicians Hospitalizations, surgeries, and ER visits in previous 12 months Vitals Screenings to include cognitive, depression, and falls Referrals and appointments  In addition, I have reviewed and discussed with patient certain preventive protocols, quality metrics, and best practice recommendations. A written personalized care plan for preventive services as well as general preventive health recommendations were provided to patient.     Clemetine Marker, LPN   [id number]/61/0960   Nurse Notes: none

## 2021-08-13 DIAGNOSIS — Z961 Presence of intraocular lens: Secondary | ICD-10-CM | POA: Diagnosis not present

## 2021-09-06 ENCOUNTER — Ambulatory Visit (INDEPENDENT_AMBULATORY_CARE_PROVIDER_SITE_OTHER): Payer: Medicare Other | Admitting: Family Medicine

## 2021-09-06 ENCOUNTER — Ambulatory Visit
Admission: RE | Admit: 2021-09-06 | Discharge: 2021-09-06 | Disposition: A | Discharge status: Home / Self Care | Payer: Medicare Other | Source: Ambulatory Visit | Attending: Family Medicine | Admitting: Family Medicine

## 2021-09-06 ENCOUNTER — Ambulatory Visit
Admission: RE | Admit: 2021-09-06 | Discharge: 2021-09-06 | Disposition: A | Discharge status: Home / Self Care | Payer: Medicare Other | Attending: Family Medicine | Admitting: Family Medicine

## 2021-09-06 ENCOUNTER — Encounter: Payer: Self-pay | Admitting: Family Medicine

## 2021-09-06 VITALS — BP 137/89 | HR 68 | Ht 69.0 in | Wt 170.0 lb

## 2021-09-06 DIAGNOSIS — M79662 Pain in left lower leg: Secondary | ICD-10-CM | POA: Diagnosis not present

## 2021-09-06 MED ORDER — DICLOFENAC SODIUM 1 % EX GEL: 2.0000 g | Freq: Four times a day (QID) | CUTANEOUS | 1 refills | Status: AC

## 2021-09-06 MED ORDER — TRAMADOL HCL 50 MG PO TABS: 50.0000 mg | ORAL_TABLET | Freq: Three times a day (TID) | ORAL | 0 refills | Status: AC | PRN

## 2021-09-06 NOTE — Progress Notes (Signed)
Date:  09/06/2021   Name:  Marcus Gonzalez   DOB:  11/02/1950   MRN:  [id number]   Chief Complaint: Ankle Pain (Walked a lot in [place] and foot was bothering him then. Hurts to put pressure down when walking. )  Ankle Pain  The incident occurred more than 1 week ago (10 days). There was no injury mechanism (alot of walking). The pain is present in the left ankle. The quality of the pain is described as aching. The pain is mild. Pertinent negatives include no inability to bear weight, loss of motion, loss of sensation, muscle weakness, numbness or tingling. The symptoms are aggravated by movement and weight bearing. Treatments tried: tramadol. The treatment provided mild relief.    Lab Results  Component Value Date   NA 140 06/20/2021   K 4.8 06/20/2021   CO2 23 06/20/2021   GLUCOSE 91 06/20/2021   BUN 13 06/20/2021   CREATININE 0.94 06/20/2021   CALCIUM 10.0 06/20/2021   EGFR 87 06/20/2021   GFRNONAA 74 12/16/2019   Lab Results  Component Value Date   CHOL 162 06/20/2021   HDL 68 06/20/2021   LDLCALC 78 06/20/2021   TRIG 85 06/20/2021   CHOLHDL 2.3 11/29/2016   No results found for: "TSH" No results found for: "HGBA1C" No results found for: "WBC", "HGB", "HCT", "MCV", "PLT" Lab Results  Component Value Date   ALT 22 06/20/2021   AST 24 06/20/2021   ALKPHOS 66 06/20/2021   BILITOT 0.6 06/20/2021   No results found for: "25OHVITD2", "25OHVITD3", "VD25OH"   Review of Systems  Respiratory:  Negative for cough, shortness of breath and wheezing.   Cardiovascular:  Negative for chest pain, palpitations and leg swelling.  Neurological:  Negative for tingling and numbness.    Patient Active Problem List   Diagnosis Date Noted   Abdominal aortic atherosclerosis ([place]) 07/07/2020   Arthralgia of hip 06/03/2018   Arthritis of foot 06/03/2018   Encounter for screening colonoscopy    Benign neoplasm of ascending colon    Polyp of sigmoid colon    Rectal polyp    Primary  localized osteoarthrosis, hand 05/05/2017   Hyperlipidemia 11/29/2016   Degeneration, intervertebral disc, cervical 04/19/2016   Migraine without status migrainosus, not intractable 04/19/2016   Essential hypertension 04/19/2016   Cephalalgia 05/24/2014   Routine general medical examination at a health care facility 05/24/2014   Anxiety attack 05/24/2014   Arthritis 05/24/2014   Benign prostatic hyperplasia without lower urinary tract symptoms 05/24/2014   Narrowing of intervertebral disc space 05/24/2014   History of migraine headaches 05/24/2014   Personal history of other malignant neoplasm of skin 02/28/2012    Allergies  Allergen Reactions   Cephalexin     Past Surgical History:  Procedure Laterality Date   BASAL CELL CARCINOMA EXCISION     arm and leg   COLONOSCOPY  2013   cleared for 5 yrs- Dr Allen Norris   COLONOSCOPY WITH PROPOFOL N/A 08/22/2017   Procedure: COLONOSCOPY WITH PROPOFOL;  Surgeon: Lucilla Lame, MD;  Location: [facility];  Service: Endoscopy;  Laterality: N/A;   POLYPECTOMY  08/22/2017   Procedure: POLYPECTOMY INTESTINAL;  Surgeon: Lucilla Lame, MD;  Location: [facility];  Service: Endoscopy;;   skin ca removed      Social History   Tobacco Use   Smoking status: Former    Packs/day: 1.00    Years: 29.00    Total pack years: 29.00    Types: Cigarettes  Quit date: 1999    Years since quitting: 24.6   Smokeless tobacco: Never   Tobacco comments:    smoking cessation materials not required  Vaping Use   Vaping Use: Never used  Substance Use Topics   Alcohol use: Yes    Alcohol/week: 14.0 standard drinks of alcohol    Types: 14 Glasses of wine per week   Drug use: No     Medication list has been reviewed and updated.  Current Meds  Medication Sig   amLODipine (NORVASC) 2.5 MG tablet TAKE ONE TABLET BY MOUTH ONE TIME DAILY   amLODipine (NORVASC) 2.5 MG tablet Take 1 tablet (2.5 mg total) by mouth daily.   Apple Cider Vinegar 188  MG CAPS Take by mouth.   Ascorbic Acid (VITAMIN C) 100 MG tablet Take by mouth.   B Complex Vitamins (VITAMIN B-COMPLEX) TABS Take by mouth.   Bee Pollen 1000 MG TABS Take by mouth.   Bilberry 100 MG CAPS Take by mouth.   Calcium Citrate 1040 MG TABS Take by mouth.   Cinnamon 500 MG capsule Take by mouth.   clotrimazole-betamethasone (LOTRISONE) cream Apply 1 application. topically daily.   Coenzyme Q10 (COQ-10) 100 MG CAPS Take by mouth.   Cranberry 200 MG CAPS Take by mouth.   cyclobenzaprine (FLEXERIL) 10 MG tablet Take 1 tablet (10 mg total) by mouth at bedtime as needed for muscle spasms.   Echinacea 500 MG CAPS Take by mouth.   Flaxseed, Linseed, (FLAXSEED OIL PO) Take by mouth.   Garlic 0370 MG CAPS Take by mouth.   Glucosamine-Chondroitin (GLUCOSAMINE CHONDR COMPLEX PO) Take by mouth.   lisinopril (ZESTRIL) 20 MG tablet Take 1 tablet (20 mg total) by mouth daily.   lisinopril (ZESTRIL) 20 MG tablet TAKE ONE TABLET BY MOUTH ONE TIME DAILY   meloxicam (MOBIC) 15 MG tablet TAKE ONE TABLET BY MOUTH ONE TIME DAILY   meloxicam (MOBIC) 15 MG tablet Take 1 tablet (15 mg total) by mouth daily.   Multiple Vitamins-Minerals (MULTIVITAMIN WITH MINERALS) tablet Take 1 tablet by mouth daily.   propranolol ER (INDERAL LA) 80 MG 24 hr capsule Take 1 capsule (80 mg total) by mouth daily.   Shark Cartilage 500 MG CAPS Take by mouth.   tadalafil (CIALIS) 20 MG tablet Take one tablet PRN for erectile dysfunction   UNABLE TO FIND [employer] toxifier/regenerator herb       09/06/2021    8:01 AM 06/20/2021    9:32 AM 12/18/2020    8:09 AM 11/24/2020   10:43 AM  GAD 7 : Generalized Anxiety Score  Nervous, Anxious, on Edge 0 1 1 0  Control/stop worrying 0 0 0 0  Worry too much - different things 0 0 0 0  Trouble relaxing 0 0 1 0  Restless 0 0 0 0  Easily annoyed or irritable 0 1 0 0  Afraid - awful might happen 0 0 0 0  Total GAD 7 Score 0 2 2 0  Anxiety Difficulty Not difficult at all Not  difficult at all Not difficult at all        09/06/2021    8:01 AM 07/16/2021    1:03 PM 06/20/2021    9:32 AM  Depression screen PHQ 2/9  Decreased Interest 0 0 0  Down, Depressed, Hopeless 0 0 1  PHQ - 2 Score 0 0 1  Altered sleeping 0 1 2  Tired, decreased energy 0 0 0  Change in appetite 0 0 0  Feeling bad or failure about yourself  0 0 0  Trouble concentrating 0 0 0  Moving slowly or fidgety/restless 0 0 0  Suicidal thoughts 0 0 0  PHQ-9 Score 0 1 3  Difficult doing work/chores Not difficult at all Not difficult at all Not difficult at all    BP Readings from Last 3 Encounters:  09/06/21 (!) 144/90  06/20/21 138/82  02/28/21 130/78    Physical Exam Cardiovascular:     Rate and Rhythm: Normal rate.     Heart sounds: Normal heart sounds. No murmur heard. Pulmonary:     Breath sounds: No wheezing or rhonchi.  Musculoskeletal:     Left lower leg: Tenderness and bony tenderness present. No swelling.     Comments: Tender midfibular to external malleolus  Neurological:     Mental Status: He is alert.     Wt Readings from Last 3 Encounters:  09/06/21 170 lb (77.1 kg)  06/20/21 173 lb (78.5 kg)  02/28/21 172 lb (78 kg)    BP (!) 144/90   Pulse 68   Ht 5' 9"  (1.753 m)   Wt 170 lb (77.1 kg)   BMI 25.10 kg/m   Assessment and Plan:  1. Pain of left lower leg Chronic.  Persistent.  Stable.  Pain began about 10 days and then there was a lot of walking and your.  There is tenderness on the mid left fibula to the external malleolus and posterior to it as well.  This is either tendinitis or remote possibility of a stress fracture.  Will continue meloxicam 15 mg once a day with an occasional tramadol on an as-needed basis.  Patient is also been given some Voltaren gel to be used on the spot area of the fibula that is tender.  We will obtain an x-ray to see if there is any evidence of fracture - DG Tibia/Fibula Left - traMADol (ULTRAM) 50 MG tablet; Take 1 tablet (50 mg  total) by mouth every 8 (eight) hours as needed for up to 5 days.  Dispense: 15 tablet; Refill: 0 - diclofenac Sodium (VOLTAREN) 1 % GEL; Apply 2 g topically 4 (four) times daily.  Dispense: 50 g; Refill: 1    Otilio Miu, MD

## 2021-09-10 ENCOUNTER — Encounter: Payer: Self-pay | Admitting: Family Medicine

## 2021-09-10 ENCOUNTER — Ambulatory Visit (INDEPENDENT_AMBULATORY_CARE_PROVIDER_SITE_OTHER): Payer: Medicare Other | Admitting: Family Medicine

## 2021-09-10 VITALS — BP 150/90 | HR 68 | Ht 69.0 in | Wt 170.0 lb

## 2021-09-10 DIAGNOSIS — S82832A Other fracture of upper and lower end of left fibula, initial encounter for closed fracture: Secondary | ICD-10-CM | POA: Diagnosis not present

## 2021-09-10 DIAGNOSIS — S82832G Other fracture of upper and lower end of left fibula, subsequent encounter for closed fracture with delayed healing: Secondary | ICD-10-CM | POA: Insufficient documentation

## 2021-09-10 MED ORDER — VITAMIN D (ERGOCALCIFEROL) 1.25 MG (50000 UNIT) PO CAPS: 50000.0000 [IU] | ORAL_CAPSULE | ORAL | 0 refills | Status: DC

## 2021-09-10 NOTE — Assessment & Plan Note (Signed)
2 week history of left lateral ankle pain, uncertain about specific trauma but does not rule this out such as inversion, denies any swelling or ecchymosis, no significant change in activity, has been able to ambulate though with increasing pain.  Examination shows focal tenderness slightly proximal to the lateral malleolus, nontender lateral medial ligaments, no laxity throughout the joint, provocative testing otherwise benign, nontender at the proximal fibular head.  X-rays do demonstrate nondisplaced oblique fracture through the distal fibula.  Given the fact that he has been ambulating this for 2 weeks, in fact travel to [place], we will immobilize with cam boot due to the pain with weightbearing, start Rx course of vitamin D, and have him return in 4 weeks.  He will wean from cam boot while at home in 2 weeks while starting home-based rehab right away.  New x-rays are to be obtained at his return

## 2021-09-10 NOTE — Patient Instructions (Signed)
-   Wear cam boot at all times, okay to remove for sleeping - After 2 weeks can wean from cam boot while at home, continue cam boot outside of the home - Start home exercises information provided today, use ankle symptoms as a guide for gentle progression - Start weekly Rx vitamin D - Increase daily calcium to 2000 mg - Can use rest, elevation, ice for pain control - Return for follow-up in 4 weeks, obtain x-rays prior to that visit

## 2021-09-10 NOTE — Progress Notes (Signed)
Primary Care / Sports Medicine Office Visit  Patient Information:  Patient ID: Marcus Gonzalez, male DOB: [birth year] Age: 71 y.o. MRN: [id number]   Marcus Gonzalez is a pleasant 71 y.o. male presenting with the following:  Chief Complaint  Patient presents with   Leg Pain    Walked a lot in [place] and foot was bothering him then. Hurts to put pressure down when walking. Been 2 weeks. No known injury    Vitals:   09/10/21 0822  BP: (!) 150/90  Pulse: 68   Vitals:   09/10/21 0822  Weight: 170 lb (77.1 kg)  Height: '5\' 9"'$  (1.753 m)   Body mass index is 25.1 kg/m.  DG Tibia/Fibula Left  Result Date: 09/06/2021 CLINICAL DATA:  Left lower leg pain for 2 weeks.  No known injury EXAM: LEFT TIBIA AND FIBULA - 2 VIEW COMPARISON:  None Available. FINDINGS: Acute to subacute appearing nondisplaced fracture of the distal left fibular metadiaphysis. No additional fractures are seen. 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 Tibiotalar joint space is maintained. Ankle mortise congruent. Mild soft tissue swelling at the lower leg distally. IMPRESSION: 1. Acute to subacute appearing nondisplaced fracture of the distal left fibular metadiaphysis. 2. 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 Electronically Signed   By: Davina Poke D.O.   On: 09/06/2021 08:48     Independent interpretation of notes and tests performed by another provider:   Independent interpretation of the left tib-fib x-ray reveals distal left oblique nondisplaced fracture, acute/subacute in appearance.  Procedures performed:   None  Pertinent History, Exam, Impression, and Recommendations:   Problem List Items Addressed This Visit       Musculoskeletal and Integument   Closed fracture of left distal fibula - Primary    2 week history of left lateral ankle pain, uncertain about specific trauma but does not rule this out such as inversion, denies any swelling or ecchymosis, no  significant change in activity, has been able to ambulate though with increasing pain.  Examination shows focal tenderness slightly proximal to the lateral malleolus, nontender lateral medial ligaments, no laxity throughout the joint, provocative testing otherwise benign, nontender at the proximal fibular head.  X-rays do demonstrate nondisplaced oblique fracture through the distal fibula.  Given the fact that he has been ambulating this for 2 weeks, in fact travel to [place], we will immobilize with cam boot due to the pain with weightbearing, start Rx course of vitamin D, and have him return in 4 weeks.  He will wean from cam boot while at home in 2 weeks while starting home-based rehab right away.  New x-rays are to be obtained at his return      Relevant Medications   Vitamin D, Ergocalciferol, (DRISDOL) 1.25 MG (50000 UNIT) CAPS capsule   Other Relevant Orders   DG Ankle Complete Left     Orders & Medications Meds ordered this encounter  Medications   Vitamin D, Ergocalciferol, (DRISDOL) 1.25 MG (50000 UNIT) CAPS capsule    Sig: Take 1 capsule (50,000 Units total) by mouth every 7 (seven) days. Take for 8 total doses(weeks)    Dispense:  8 capsule    Refill:  0   Orders Placed This Encounter  Procedures   DG Ankle Complete Left     Return in about 4 weeks (around 10/08/2021) for left ankle fracture follow-up.     [name], MD   [place]  [place]

## 2021-09-14 DIAGNOSIS — C61 Malignant neoplasm of prostate: Secondary | ICD-10-CM | POA: Diagnosis not present

## 2021-09-20 DIAGNOSIS — D2272 Melanocytic nevi of left lower limb, including hip: Secondary | ICD-10-CM | POA: Diagnosis not present

## 2021-09-20 DIAGNOSIS — Z85828 Personal history of other malignant neoplasm of skin: Secondary | ICD-10-CM | POA: Diagnosis not present

## 2021-09-20 DIAGNOSIS — L570 Actinic keratosis: Secondary | ICD-10-CM | POA: Diagnosis not present

## 2021-09-20 DIAGNOSIS — C44712 Basal cell carcinoma of skin of right lower limb, including hip: Secondary | ICD-10-CM | POA: Diagnosis not present

## 2021-09-20 DIAGNOSIS — C44719 Basal cell carcinoma of skin of left lower limb, including hip: Secondary | ICD-10-CM | POA: Diagnosis not present

## 2021-09-20 DIAGNOSIS — D2262 Melanocytic nevi of left upper limb, including shoulder: Secondary | ICD-10-CM | POA: Diagnosis not present

## 2021-09-20 DIAGNOSIS — D2261 Melanocytic nevi of right upper limb, including shoulder: Secondary | ICD-10-CM | POA: Diagnosis not present

## 2021-09-20 DIAGNOSIS — D485 Neoplasm of uncertain behavior of skin: Secondary | ICD-10-CM | POA: Diagnosis not present

## 2021-10-11 ENCOUNTER — Ambulatory Visit (INDEPENDENT_AMBULATORY_CARE_PROVIDER_SITE_OTHER): Payer: Medicare Other | Admitting: Family Medicine

## 2021-10-11 ENCOUNTER — Encounter: Payer: Self-pay | Admitting: Family Medicine

## 2021-10-11 ENCOUNTER — Ambulatory Visit
Admission: RE | Admit: 2021-10-11 | Discharge: 2021-10-11 | Disposition: A | Discharge status: Home / Self Care | Payer: Medicare Other | Attending: Family Medicine | Admitting: Family Medicine

## 2021-10-11 ENCOUNTER — Ambulatory Visit
Admission: RE | Admit: 2021-10-11 | Discharge: 2021-10-11 | Disposition: A | Discharge status: Home / Self Care | Payer: Medicare Other | Source: Ambulatory Visit | Attending: Family Medicine | Admitting: Family Medicine

## 2021-10-11 VITALS — BP 136/88 | HR 86 | Ht 69.0 in | Wt 170.0 lb

## 2021-10-11 DIAGNOSIS — S82832A Other fracture of upper and lower end of left fibula, initial encounter for closed fracture: Secondary | ICD-10-CM

## 2021-10-11 DIAGNOSIS — S82832G Other fracture of upper and lower end of left fibula, subsequent encounter for closed fracture with delayed healing: Secondary | ICD-10-CM | POA: Diagnosis not present

## 2021-10-11 DIAGNOSIS — S82452A Displaced comminuted fracture of shaft of left fibula, initial encounter for closed fracture: Secondary | ICD-10-CM | POA: Diagnosis not present

## 2021-10-11 NOTE — Progress Notes (Signed)
     Primary Care / Sports Medicine Office Visit  Patient Information:  Patient ID: Marcus Gonzalez, male DOB: [birth year] Age: 71 y.o. MRN: [id number]   Marcus Gonzalez is a pleasant 71 y.o. male presenting with the following:  Chief Complaint  Patient presents with   Closed fracture of distal end of left fibula    Vitals:   10/11/21 1122  BP: 136/88  Pulse: 86  SpO2: 98%   Vitals:   10/11/21 1122  Weight: 170 lb (77.1 kg)  Height: '5\' 9"'$  (1.753 m)   Body mass index is 25.1 kg/m.  No results found.   Independent interpretation of notes and tests performed by another provider:   Independent interpretation left tibia-fibula ankle x-ray dated 10/11/2021 reveal interval subtle displacement at the distal fibular fracture, there appears to be early callus formation, and overall alignment is preserved, no new osseous findings noted  Procedures performed:   Procedure: Left short leg cast application [place] and padding applied, fiberglass placed and molded, additional layers at the plantar aspect placed to allow weightbearing. Distal pulses and sensorimotor status confirmed pre and post application. Completed without difficulty and tolerated well.   Pertinent History, Exam, Impression, and Recommendations:   Problem List Items Addressed This Visit       Musculoskeletal and Integument   Closed fracture of left distal fibula - Primary    [name] presents for follow-up to distal fibular fracture from early 08/2021 timeframe, at his last visit on 09/10/2021, he had completed a walking tour in [place], x-rays at that time, after the trip, showed nondisplaced oblique fracture to the distal fibula.  Given the high level of ambulation with an ambulance without immobilization, radiographic and clinical features, we had a discussion and did proceed with cam boot immobilization at all times for 2 weeks followed by continued use of cam boot while outside of the home, in the home gentle  wean with home exercises.  While he has maintained high level of ambulation (10,000 steps at a recent football game), pain has been controlled until past few days when he noticed an uptick in pain and swelling, denies any new injury or "tweaking "the ankle, has been compliant with boot.  Examination reveals mild tenderness at the distal fibula, interval decrease in the swelling, no skin color changes, full range of motion with mild pain during active inversion.  Given his interval radiographic findings, did discuss the need for transition to short leg cast for immobilization and close follow-up in 3 weeks.  He will continue weekly vitamin D and daily calcium.  New x-rays are to be obtained outside of the cast at his return.        Orders & Medications No orders of the defined types were placed in this encounter.  No orders of the defined types were placed in this encounter.    Return in about 3 weeks (around 11/01/2021).     [name], MD   Primary Care Sports [facility] [place]

## 2021-10-11 NOTE — Assessment & Plan Note (Signed)
Mr. Dutton presents for follow-up to distal fibular fracture from early 08/2021 timeframe, at his last visit on 09/10/2021, he had completed a walking tour in [place], x-rays at that time, after the trip, showed nondisplaced oblique fracture to the distal fibula.  Given the high level of ambulation with an ambulance without immobilization, radiographic and clinical features, we had a discussion and did proceed with cam boot immobilization at all times for 2 weeks followed by continued use of cam boot while outside of the home, in the home gentle wean with home exercises.  While he has maintained high level of ambulation (10,000 steps at a recent football game), pain has been controlled until past few days when he noticed an uptick in pain and swelling, denies any new injury or "tweaking "the ankle, has been compliant with boot.  Examination reveals mild tenderness at the distal fibula, interval decrease in the swelling, no skin color changes, full range of motion with mild pain during active inversion.  Given his interval radiographic findings, did discuss the need for transition to short leg cast for immobilization and close follow-up in 3 weeks.  He will continue weekly vitamin D and daily calcium.  New x-rays are to be obtained outside of the cast at his return.

## 2021-10-11 NOTE — Patient Instructions (Signed)
-   Reviewed cast care information, keep cast dry - Can perform gentle weightbearing through the cast, we will contact you with "cast shoe" to wear over the cast sole, alternatively can pick this up over-the-counter - Continue weekly Rx vitamin D and daily calcium - Keep foot/ankle elevated above the level of the heart whenever possible and wiggle toes to encourage circulation - Contact us for any change in symptoms or cast issues - Return for follow-up in 3 weeks, we will remove the cast and proceed for new x-rays

## 2021-10-12 ENCOUNTER — Ambulatory Visit: Payer: Medicare Other | Admitting: Family Medicine

## 2021-10-18 ENCOUNTER — Ambulatory Visit (INDEPENDENT_AMBULATORY_CARE_PROVIDER_SITE_OTHER): Payer: Medicare Other | Admitting: Family Medicine

## 2021-10-18 ENCOUNTER — Ambulatory Visit: Payer: Self-pay | Admitting: *Deleted

## 2021-10-18 ENCOUNTER — Encounter: Payer: Self-pay | Admitting: Family Medicine

## 2021-10-18 DIAGNOSIS — S82832G Other fracture of upper and lower end of left fibula, subsequent encounter for closed fracture with delayed healing: Secondary | ICD-10-CM

## 2021-10-18 NOTE — Telephone Encounter (Signed)
Appointment at 11am

## 2021-10-18 NOTE — Progress Notes (Signed)
     Primary Care / Sports Medicine Office Visit  Patient Information:  Patient ID: Marcus Gonzalez, male DOB: 1950-12-24 Age: 71 y.o. MRN: [id number]   [name] is a pleasant 72 y.o. male presenting with the following:  Chief Complaint  Patient presents with   Closed fracture of distal end of left fibula with delayed h    Vitals:   10/18/21 1040  BP: 132/84  Pulse: 80  SpO2: 98%   Vitals:   10/18/21 1040  Weight: 170 lb (77.1 kg)  Height: '5\' 9"'$  (1.753 m)   Body mass index is 25.1 kg/m.     Independent interpretation of notes and tests performed by another provider:   None  Procedures performed:   Procedure: Left short leg cast modification. Location of pain just inferior to the tibial tuberosity, cut out proximal to upper third of anterior tibial portion of cast, additional padding placed followed by fiberglass placement Completed without difficulty and tolerated well.   Pertinent History, Exam, Impression, and Recommendations:   Problem List Items Addressed This Visit       Musculoskeletal and Integument   Closed fracture of left distal fibula    Patient has been noticing progressive left anterior shin rubbing and irritation, has not noted any drainage outside of the cast, no fevers, no chills, noted with increased walking.  After review of patient's stated concerns, location of pain, cast was modified with removal of proximal anterior portion, additional padding placed, additional fiberglass overlaid.  Patient reported relief of previously noted rubbing, will return as scheduled.        Orders & Medications No orders of the defined types were placed in this encounter.  No orders of the defined types were placed in this encounter.    No follow-ups on file.     [name], MD   Primary Care Sports Medicine [place]

## 2021-10-18 NOTE — Assessment & Plan Note (Signed)
Patient has been noticing progressive left anterior shin rubbing and irritation, has not noted any drainage outside of the cast, no fevers, no chills, noted with increased walking.  After review of patient's stated concerns, location of pain, cast was modified with removal of proximal anterior portion, additional padding placed, additional fiberglass overlaid.  Patient reported relief of previously noted rubbing, will return as scheduled.

## 2021-10-18 NOTE — Telephone Encounter (Signed)
Message from [place] sent at 10/18/2021  8:18 AM EDT  Summary: cast hrting leg   Pt states he has a cast on his lt leg and the front of the cast is cutting into the front of his leg when he walks   Pt inquiring if that is normal   Please assist further           Call History   Type Contact Phone/Fax User  10/18/2021 08:16 AM EDT Phone (Incoming) Gonzalez, Marcus (Self) [phone] Lemmie Evens) Leroy Kennedy R   Reason for Disposition  Localized pain, redness or hard lump along vein    Has a cast on and it's rubbing into the skin on the front of his left leg when he walks.  Answer Assessment - Initial Assessment Questions 1. ONSET: "When did the pain start?"      My cast is digging into the bone in the front of my leg when I walk.    I can't see the area.   I'm at work sitting at my desk now.    It seems to be a little tighter.  I can't tell if the skin is broken because it's down in the cast but it doesn't feel like it's infected or nothing like that. 2. LOCATION: "Where is the pain located?"      Near the top of the cast 3-4 inches  from the top. 3. PAIN: "How bad is the pain?"    (Scale 1-10; or mild, moderate, severe)   -  MILD (1-3): doesn't interfere with normal activities    -  MODERATE (4-7): interferes with normal activities (e.g., work or school) or awakens from sleep, limping    -  SEVERE (8-10): excruciating pain, unable to do any normal activities, unable to walk     Rubs when I walk only and it feels a little tighter than in the beginning.   I didn't know if that is normal or what.  I've never had a cast before. 4. WORK OR EXERCISE: "Has there been any recent work or exercise that involved this part of the body?"      Has a broken bone in left leg so is in a cast. 5. CAUSE: "What do you think is causing the leg pain?"     Cast is rubbing just when I walk.     I'm using a scooter.   6. OTHER SYMPTOMS: "Do you have any other symptoms?" (e.g., chest pain, back pain,  breathing difficulty, swelling, rash, fever, numbness, weakness)     It's not hurting me bad at all but just wanted to let y'all know in case there was something more to be done with the cast.   7. PREGNANCY: "Is there any chance you are pregnant?" "When was your last menstrual period?"     N/A  Protocols used: Leg Pain-A-AH

## 2021-10-18 NOTE — Telephone Encounter (Signed)
Please review.  KP

## 2021-10-18 NOTE — Telephone Encounter (Signed)
  Chief Complaint: Cast on left leg is rubbing into the skin/bone on the front of his leg when he walks.  "I just wanted to let someone know because I've never had a cast before and don't know what to expect". Symptoms: Cast is rubbing an area about 3-4 inches down inside the cast from the top.   He can't see it to know what it looks like. Frequency: Rubs with walking.   Using a scooter for ambulation for longer distances. Pertinent Negatives: Patient denies feeling like it's infected but he can't see it so doesn't know for sure.   It's more a discomfort when walking than anything. Disposition: '[]'$ ED /'[]'$ Urgent Care (no appt availability in office) / '[]'$ Appointment(In office/virtual)/ '[]'$  [facility]/ '[]'$ Home Care/ '[]'$ Refused Recommended Disposition /'[]'$ [facility]/ '[x]'$  Follow-up with PCP Additional Notes: Message sent to Dr. Rosette Reveal.   Pt agreeable to being called back if something more is needed or if Dr. [name] wants him to come in.   Mainly wanted to know if this was normal with a cast for it to rub.

## 2021-10-18 NOTE — Patient Instructions (Signed)
-   Cast care as discussed - Weightbearing and use of knee scooter advised - Continue weekly Rx vitamin D and daily OTC calcium 2000 mg - Contact for any questions / concerns - Return as scheduled

## 2021-11-02 ENCOUNTER — Encounter: Payer: Self-pay | Admitting: Family Medicine

## 2021-11-02 ENCOUNTER — Ambulatory Visit (INDEPENDENT_AMBULATORY_CARE_PROVIDER_SITE_OTHER): Payer: Medicare Other | Admitting: Family Medicine

## 2021-11-02 ENCOUNTER — Other Ambulatory Visit: Payer: Self-pay

## 2021-11-02 ENCOUNTER — Ambulatory Visit
Admission: RE | Admit: 2021-11-02 | Discharge: 2021-11-02 | Disposition: A | Discharge status: Home / Self Care | Payer: Medicare Other | Source: Ambulatory Visit | Attending: Family Medicine | Admitting: Family Medicine

## 2021-11-02 ENCOUNTER — Ambulatory Visit
Admission: RE | Admit: 2021-11-02 | Discharge: 2021-11-02 | Disposition: A | Discharge status: Home / Self Care | Payer: Medicare Other | Attending: Family Medicine | Admitting: Family Medicine

## 2021-11-02 VITALS — BP 130/80 | HR 88

## 2021-11-02 DIAGNOSIS — Z23 Encounter for immunization: Secondary | ICD-10-CM

## 2021-11-02 DIAGNOSIS — S82832A Other fracture of upper and lower end of left fibula, initial encounter for closed fracture: Secondary | ICD-10-CM

## 2021-11-02 DIAGNOSIS — S82832D Other fracture of upper and lower end of left fibula, subsequent encounter for closed fracture with routine healing: Secondary | ICD-10-CM

## 2021-11-02 MED ORDER — VITAMIN D (ERGOCALCIFEROL) 1.25 MG (50000 UNIT) PO CAPS: 50000.0000 [IU] | ORAL_CAPSULE | ORAL | 0 refills | Status: DC

## 2021-11-02 NOTE — Patient Instructions (Signed)
-   You have been transitioned to tall cam boot from cast, where this at all times as if it were a cast, okay to remove for sleeping, home exercises, and prolonged sitting - Start home exercises daily - Limit lower body athletics/walking - Continue weekly vitamin D and daily calcium - Return for follow-up in 4 weeks, obtain new x-rays 30 minutes prior to appointment (remove boot for x-rays) - Contact us for any questions between now and then

## 2021-11-02 NOTE — Progress Notes (Signed)
     Primary Care / Sports Medicine Office Visit  Patient Information:  Patient ID: Marcus Gonzalez, male DOB: 02-18-1950 Age: 71 y.o. MRN: [id number]   Marcus Gonzalez is a pleasant 71 y.o. male presenting with the following:  No chief complaint on file.   Vitals:   11/02/21 1125  BP: 130/80  Pulse: 88  SpO2: 99%   There were no vitals filed for this visit. There is no height or weight on file to calculate BMI.     Independent interpretation of notes and tests performed by another provider:   Independent interpretation of left ankle x-rays dated 11/02/2021 reveals no further interval displacement, there is prominent callus formation about the fracture at the distal fibula.  Procedures performed:   None  Pertinent History, Exam, Impression, and Recommendations:   Problem List Items Addressed This Visit       Musculoskeletal and Integument   Closed fracture of left distal fibula - Primary    Date of injury early August 2023.  Patient returns for follow-up, has been in cast, tolerated since modification.  States that previously noted minor pain is fully resolved.  Inspection reveals interval on interim reduction in swelling, no swelling noted, ankle range of motion consistent with immobilization, nontender to palpation of the distal fibula, percussion elicits minor discomfort, sensorimotor intact.  Given his x-rays demonstrating stability and interval callus formation, findings today, he has been transitioned from cast to tall cam boot, continued Rx vitamin D weekly and daily calcium advised as well as activity restrictions and limits.  He will return in 4 weeks for repeat x-rays outside of boot.  If further displacement noted, referral to orthopedic surgery to be considered.  If stable fracture and further callus formation, can discuss a gradual wean from boot.      Relevant Medications   Vitamin D, Ergocalciferol, (DRISDOL) 1.25 MG (50000 UNIT) CAPS capsule   Other Relevant  Orders   DG Ankle Complete Left   Other Visit Diagnoses     Need for immunization against influenza       Relevant Orders   Flu Vaccine QUAD High Dose(Fluad) (Completed)        Orders & Medications Meds ordered this encounter  Medications   Vitamin D, Ergocalciferol, (DRISDOL) 1.25 MG (50000 UNIT) CAPS capsule    Sig: Take 1 capsule (50,000 Units total) by mouth every 7 (seven) days. Take for 8 total doses(weeks)    Dispense:  8 capsule    Refill:  0   Orders Placed This Encounter  Procedures   DG Ankle Complete Left   Flu Vaccine QUAD High Dose(Fluad)     Return in about 4 weeks (around 11/30/2021).     [name], MD   Primary Care Sports Medicine [place]

## 2021-11-02 NOTE — Assessment & Plan Note (Addendum)
Date of injury early August 2023.  Patient returns for follow-up, has been in cast, tolerated since modification.  States that previously noted minor pain is fully resolved.  Inspection reveals interval on interim reduction in swelling, no swelling noted, ankle range of motion consistent with immobilization, nontender to palpation of the distal fibula, percussion elicits minor discomfort, sensorimotor intact.  Given his x-rays demonstrating stability and interval callus formation, findings today, he has been transitioned from cast to tall cam boot, continued Rx vitamin D weekly and daily calcium advised as well as activity restrictions and limits.  He will return in 4 weeks for repeat x-rays outside of boot.  If further displacement noted, referral to orthopedic surgery to be considered.  If stable fracture and further callus formation, can discuss a gradual wean from boot.

## 2021-11-23 DIAGNOSIS — Z23 Encounter for immunization: Secondary | ICD-10-CM | POA: Diagnosis not present

## 2021-11-27 DIAGNOSIS — C44712 Basal cell carcinoma of skin of right lower limb, including hip: Secondary | ICD-10-CM | POA: Diagnosis not present

## 2021-11-27 DIAGNOSIS — C44719 Basal cell carcinoma of skin of left lower limb, including hip: Secondary | ICD-10-CM | POA: Diagnosis not present

## 2021-11-30 ENCOUNTER — Ambulatory Visit (INDEPENDENT_AMBULATORY_CARE_PROVIDER_SITE_OTHER): Payer: Medicare Other | Admitting: Family Medicine

## 2021-11-30 ENCOUNTER — Ambulatory Visit
Admission: RE | Admit: 2021-11-30 | Discharge: 2021-11-30 | Disposition: A | Discharge status: Home / Self Care | Payer: Medicare Other | Source: Ambulatory Visit | Attending: Family Medicine | Admitting: Family Medicine

## 2021-11-30 ENCOUNTER — Ambulatory Visit
Admission: RE | Admit: 2021-11-30 | Discharge: 2021-11-30 | Disposition: A | Discharge status: Home / Self Care | Payer: Medicare Other | Attending: Family Medicine | Admitting: Family Medicine

## 2021-11-30 ENCOUNTER — Encounter: Payer: Self-pay | Admitting: Family Medicine

## 2021-11-30 ENCOUNTER — Other Ambulatory Visit: Payer: Self-pay

## 2021-11-30 VITALS — BP 120/82 | HR 70

## 2021-11-30 DIAGNOSIS — S82832D Other fracture of upper and lower end of left fibula, subsequent encounter for closed fracture with routine healing: Secondary | ICD-10-CM

## 2021-11-30 NOTE — Progress Notes (Signed)
     Primary Care / Sports Medicine Office Visit  Patient Information:  Patient ID: Marcus Gonzalez, male DOB: [birth year] Age: 71 y.o. MRN: [id number]   Marcus Gonzalez is a pleasant 71 y.o. male presenting with the following:  Chief Complaint  Patient presents with   Closed fracture of distal end of left fibula with routine h    Pt is feeling much better    Vitals:   11/30/21 1006  BP: 120/82  Pulse: 70  SpO2: 99%   There were no vitals filed for this visit. There is no height or weight on file to calculate BMI.     Independent interpretation of notes and tests performed by another provider:   Independent interpretation of left ankle x-rays dated 11/30/2021 with no interval alignment change at the distal fibular fracture site, there is interval callus formation, fracture line is still visible.  Procedures performed:   None  Pertinent History, Exam, Impression, and Recommendations:   Problem List Items Addressed This Visit       Musculoskeletal and Integument   Closed fracture of left distal fibula - Primary    Presents for follow-up to distal fibular fracture, has been compliant with cam boot in and out of the house, performing some exercises when he is able to do so.  Describes pain being minimal, occurring sporadically, lasting 5-30 minutes, alleviated with time and rest.  No new symptoms.  Examination with mild bony prominence that is minimally tender at the distal fibula consistent with fracture site, tenderness to percussion noted as well, range of motion consistent with immobilization.  Given his x-rays demonstrating continued interval callus without interval malalignment, advised continued cam boot while outside of the home, start of formal physical therapy, gradual wean and discontinuation of boot while inside the home.  Continued home exercises to be overseen by PT.  He is to finish out remaining Rx vitamin D, follow-up in 1 month with repeat x-rays done prior to  visit.      Relevant Orders   DG Ankle Complete Left   Ambulatory referral to Physical Therapy     Orders & Medications No orders of the defined types were placed in this encounter.  Orders Placed This Encounter  Procedures   DG Ankle Complete Left   Ambulatory referral to Physical Therapy     Return in about 26 days (around 12/26/2021) for 11/28 or 11/29.     [name], MD   Primary Care Sports Medicine [place]

## 2021-11-30 NOTE — Patient Instructions (Addendum)
-   Continue boot outside of the home - While in home, wean and work towards discontinuation of the boot - Perform activity both in and out of the home as tolerated using outer left ankle symptoms as a guide (do not push through pain) - PT will contact you for scheduling - Return end of month with repeat x-rays for reevaluation - Contact us for any questions between now and then  [facility] Physical Therapy:  [place]:  [place]: [phone]

## 2021-11-30 NOTE — Assessment & Plan Note (Signed)
Presents for follow-up to distal fibular fracture, has been compliant with cam boot in and out of the house, performing some exercises when he is able to do so.  Describes pain being minimal, occurring sporadically, lasting 5-30 minutes, alleviated with time and rest.  No new symptoms.  Examination with mild bony prominence that is minimally tender at the distal fibula consistent with fracture site, tenderness to percussion noted as well, range of motion consistent with immobilization.  Given his x-rays demonstrating continued interval callus without interval malalignment, advised continued cam boot while outside of the home, start of formal physical therapy, gradual wean and discontinuation of boot while inside the home.  Continued home exercises to be overseen by PT.  He is to finish out remaining Rx vitamin D, follow-up in 1 month with repeat x-rays done prior to visit.

## 2021-12-03 ENCOUNTER — Ambulatory Visit: Payer: Medicare Other | Attending: Family Medicine | Admitting: Physical Therapy

## 2021-12-03 ENCOUNTER — Encounter: Payer: Self-pay | Admitting: Physical Therapy

## 2021-12-03 DIAGNOSIS — M25572 Pain in left ankle and joints of left foot: Secondary | ICD-10-CM | POA: Insufficient documentation

## 2021-12-03 DIAGNOSIS — S82832D Other fracture of upper and lower end of left fibula, subsequent encounter for closed fracture with routine healing: Secondary | ICD-10-CM | POA: Insufficient documentation

## 2021-12-03 DIAGNOSIS — R262 Difficulty in walking, not elsewhere classified: Secondary | ICD-10-CM | POA: Diagnosis not present

## 2021-12-03 DIAGNOSIS — M6281 Muscle weakness (generalized): Secondary | ICD-10-CM | POA: Diagnosis not present

## 2021-12-03 NOTE — Therapy (Signed)
OUTPATIENT PHYSICAL THERAPY LOWER EXTREMITY EVALUATION   Patient Name: Marcus Gonzalez MRN: [id number] DOB:01/30/1950, 71 y.o., male Today's Date: 12/03/2021   PT End of Session - 12/03/21 0936     Visit Number 1    Number of Visits 13    Date for PT Re-Evaluation 01/14/22    Authorization Type IE 12/03/2021    PT Start Time 0945    PT Stop Time 1030    PT Time Calculation (min) 45 min    Activity Tolerance Patient tolerated treatment well    Behavior During Therapy [facility] for tasks assessed/performed             Past Medical History:  Diagnosis Date   BPH (benign prostatic hyperplasia)    Degenerative disc disease, cervical    Headache    Hip pain    Migraine    Prostate cancer [facility])    Past Surgical History:  Procedure Laterality Date   BASAL CELL CARCINOMA EXCISION     arm and leg   COLONOSCOPY  2013   cleared for 5 yrs- Dr Allen Norris   COLONOSCOPY WITH PROPOFOL N/A 08/22/2017   Procedure: COLONOSCOPY WITH PROPOFOL;  Surgeon: Lucilla Lame, MD;  Location: [place];  Service: Endoscopy;  Laterality: N/A;   POLYPECTOMY  08/22/2017   Procedure: POLYPECTOMY INTESTINAL;  Surgeon: Lucilla Lame, MD;  Location: [place];  Service: Endoscopy;;   skin ca removed     Patient Active Problem List   Diagnosis Date Noted   Closed fracture of left distal fibula 09/10/2021   Abdominal aortic atherosclerosis ([place]) 07/07/2020   Arthralgia of hip 06/03/2018   Arthritis of foot 06/03/2018   Encounter for screening colonoscopy    Benign neoplasm of ascending colon    Polyp of sigmoid colon    Rectal polyp    Primary localized osteoarthrosis, hand 05/05/2017   Hyperlipidemia 11/29/2016   Degeneration, intervertebral disc, cervical 04/19/2016   Migraine without status migrainosus, not intractable 04/19/2016   Essential hypertension 04/19/2016   Cephalalgia 05/24/2014   Routine general medical examination at a health care facility 05/24/2014   Anxiety attack  05/24/2014   Arthritis 05/24/2014   Benign prostatic hyperplasia without lower urinary tract symptoms 05/24/2014   Narrowing of intervertebral disc space 05/24/2014   History of migraine headaches 05/24/2014   Personal history of other malignant neoplasm of skin 02/28/2012    PCP: Juline Patch, MD  REFERRING PROVIDER: [name], MD  REFERRING DIAG: [phone] (ICD-10-CM) - Closed fracture of distal end of left fibula with routine healing, unspecified fracture morphology, subsequent encounter  THERAPY DIAG:  Muscle weakness (generalized)  Difficulty in walking, not elsewhere classified  Pain in left ankle and joints of left foot  Rationale for Evaluation and Treatment: Rehabilitation  ONSET DATE: 08/2021  SUBJECTIVE:   SUBJECTIVE STATEMENT: Patient notes no known injury; figures he must have stepped wrong. Patient notes at this point the injury is more of an aggravation than anything. Patient notes no limitation with L ankle, but does feel limited wearing CAM boot on steps. Patient states that he has occasional twinges of pain for a minute or two. Patient was able to walk to and from [facility] football [facility] this past weekend in CAM boot without issue. Worst pain in the past week is 1/10.   PERTINENT HISTORY: 11/30/2021 [name]): Presents for follow-up to distal fibular fracture, has been compliant with cam boot in and out of the house, performing some exercises when he is able  to do so.  Describes pain being minimal, occurring sporadically, lasting 5-30 minutes, alleviated with time and rest.  No new symptoms.   Examination with mild bony prominence that is minimally tender at the distal fibula consistent with fracture site, tenderness to percussion noted as well, range of motion consistent with immobilization.   Given his x-rays demonstrating continued interval callus without interval malalignment, advised continued cam boot while outside of the home, start of formal physical  therapy, gradual wean and discontinuation of boot while inside the home.  Continued home exercises to be overseen by PT.  He is to finish out remaining Rx vitamin D, follow-up in 1 month with repeat x-rays done prior to visit.  Evaluate and Treat.  Left distal fibular fracture, healing, heavy focus on strengthening and stabilization, address any deconditioning secondary to necessary cam boot usage.  Oversee home-based program. 2 times per week for 4-6 weeks. Decrease pain, increase strength, flexibility, function, and range of motion. Modalities may include, traction, ionto, phono, and stim. May include dry needling with or without stim.  09/10/2021 [name]): 2 week history of left lateral ankle pain, uncertain about specific trauma but does not rule this out such as inversion, denies any swelling or ecchymosis, no significant change in activity, has been able to ambulate though with increasing pain.  Examination shows focal tenderness slightly proximal to the lateral malleolus, nontender lateral medial ligaments, no laxity throughout the joint, provocative testing otherwise benign, nontender at the proximal fibular head.  X-rays do demonstrate nondisplaced oblique fracture through the distal fibula.  Given the fact that he has been ambulating this for 2 weeks, in fact travel to [place], we will immobilize with cam boot due to the pain with weightbearing, start Rx course of vitamin D, and have him return in 4 weeks.  He will wean from cam boot while at home in 2 weeks while starting home-based rehab right away.  New x-rays are to be obtained at his return. PAIN:  Are you having pain? No  PRECAUTIONS: None  WEIGHT BEARING RESTRICTIONS: No  FALLS:  Has patient fallen in last 6 months? Yes. 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  PATIENT SURVEYS:  FOTO 68; predicted 94 in 11 visits  COGNITION: Overall cognitive status: Within functional limits for tasks assessed     SENSATION: WFL  EDEMA:  None noted  POSTURE: Standing posture unshod is symmetrical; posteriorly tilted pelvis with diminished lumbar lordosis and mildly increased thoracic kyphosis. Reliant on Y ligaments for postural stability.   PALPATION: TTP along peroneal tendons on LLE, some tenderness noted on palpation of L distal fibula and anterior inferior tibiofibular ligament space. No tenderness to RLE palpation.  LOWER EXTREMITY AROM: RLE: combined PF/DF 25 degrees total, inversion 28, eversion 20  LLE: combined PF/DF 22 degrees total, inversion 21, eversion 18  LOWER EXTREMITY MMT:  MMT Right eval Left eval  Hip flexion    Hip extension    Hip abduction    Hip adduction    Hip internal rotation    Hip external rotation    Knee flexion    Knee extension    Ankle dorsiflexion 5 4  Ankle  plantarflexion 4 4  Ankle inversion 5 5  Ankle eversion 5 5   (Blank rows = not tested)  LOWER EXTREMITY SPECIAL TESTS:  Ankle special tests: Dorsiflexion-Eversion test: negative  FUNCTIONAL TESTS:  STS: no UE support, WFL  GAIT: Patient has lilting gait with CAM boot but no profound unsteadiness.  Unshod gait reveals symmetrical WB, limited GTE and hip extension B.    PATIENT EDUCATION:  Education details: POC, HEP, prognosis Person educated: Patient Education method: Theatre stage manager Education comprehension: verbalized understanding  HOME EXERCISE PROGRAM: [id number]  ASSESSMENT:  CLINICAL IMPRESSION: Patient is a 71 year old presenting to clinic with chief complaints of limited mobility in phase 4 of healing after closed distal fibula fracture  on LLE with no known MOI. Upon examination, patient demonstrates deficits in L ankle strength, activity tolerance, and L ankle ROM as evidenced by inversion difference > 4 degrees SEM between sides, 4/5 MMT L ankle dorsiflexion, 4/5 BLE plantar flexion (functional testing), and mobility 50% of PLOF (5-6K steps current compared to 10K steps PLOF). Patient's responses on FOTO outcome measures (68) indicate significant functional limitations/disability/distress. Patient's progress may be limited due to travel schedule and reported nonadherence to MD prescribed exercises; however, patient's PLOF and active lifestyle are advantageous. Of note, considering the patient's demographics and recent fracture without MOI, there is reasonable concern for bone density changes which would benefit further medical evaluation in pursuit of decreased future fracture risk. Patient will benefit from continued skilled therapeutic intervention to address deficits in strength, joint ROM, and activity tolerance in order to increase function and improve overall QOL.   OBJECTIVE IMPAIRMENTS: Abnormal gait, decreased activity tolerance, decreased balance, decreased coordination, decreased endurance, difficulty walking, decreased ROM, improper body mechanics, postural dysfunction, and pain.   ACTIVITY LIMITATIONS: carrying, standing, squatting, stairs, and locomotion level  PARTICIPATION LIMITATIONS: shopping, community activity, and yard work  PERSONAL FACTORS: Age, Behavior pattern, Past/current experiences, Time since onset of injury/illness/exacerbation, and 3+ comorbidities: BPH, DDD, migraine, prostate cancer, arthritis, HLD, HTN  are also affecting patient's functional outcome.   REHAB POTENTIAL: Excellent  CLINICAL DECISION MAKING: Stable/uncomplicated  EVALUATION COMPLEXITY: Low   GOALS: Goals reviewed with patient? Yes  LONG TERM GOALS: Target date: 01/14/2022   Patient will demonstrate improved function as  evidenced by a score of 78 on FOTO measure for full participation in activities at home and in the community.  Baseline: 68 Goal status: INITIAL  2.  Patient will demonstrate clinically significant improved ankle strength and stability as evidenced by ability to perform SLS heel raise for 25 repetitions with fingertip support bilaterally. Baseline: LLE 25% UE support 20 reps, RLE fingertip 20 reps Goal status: INITIAL  3.  Patient will be able to ambulate 10K steps over a 24 hour period, pain < 2/10, without AD/CAM boot in order to return to PLOF. Baseline: 5-6K Goal status: INITIAL  PLAN:  PT FREQUENCY: 1-2x/week  PT DURATION: 6 weeks  PLANNED INTERVENTIONS: Therapeutic exercises, Therapeutic activity, Neuromuscular re-education, Balance training, Gait training, Patient/Family education, Self Care, Joint mobilization, Orthotic/Fit training, Dry Needling, Electrical stimulation, Spinal mobilization, Cryotherapy, Moist heat, Taping, Ionotophoresis '4mg'$ /ml Dexamethasone, and Manual therapy  PLAN FOR NEXT SESSION: gym-based strengthening, balance training   Myles Gip PT, DPT [phone]  12/03/2021, 9:37 AM

## 2021-12-05 ENCOUNTER — Ambulatory Visit: Payer: Medicare Other | Admitting: Physical Therapy

## 2021-12-05 ENCOUNTER — Encounter: Payer: Self-pay | Admitting: Physical Therapy

## 2021-12-05 DIAGNOSIS — M6281 Muscle weakness (generalized): Secondary | ICD-10-CM

## 2021-12-05 DIAGNOSIS — R262 Difficulty in walking, not elsewhere classified: Secondary | ICD-10-CM | POA: Diagnosis not present

## 2021-12-05 DIAGNOSIS — S82832D Other fracture of upper and lower end of left fibula, subsequent encounter for closed fracture with routine healing: Secondary | ICD-10-CM | POA: Diagnosis not present

## 2021-12-05 DIAGNOSIS — M25572 Pain in left ankle and joints of left foot: Secondary | ICD-10-CM

## 2021-12-05 NOTE — Therapy (Signed)
OUTPATIENT PHYSICAL THERAPY LOWER EXTREMITY TREATMENT   Patient Name: Marcus Gonzalez MRN: [id number] DOB:11/28/1950, 71 y.o., male Today's Date: 12/05/2021   PT End of Session - 12/05/21 0859     Visit Number 2    Number of Visits 13    Date for PT Re-Evaluation 01/14/22    Authorization Type IE 12/03/2021    PT Start Time 0900    PT Stop Time 0940    PT Time Calculation (min) 40 min    Activity Tolerance Patient tolerated treatment well    Behavior During Therapy [facility] for tasks assessed/performed             Past Medical History:  Diagnosis Date   BPH (benign prostatic hyperplasia)    Degenerative disc disease, cervical    Headache    Hip pain    Migraine    Prostate cancer [facility])    Past Surgical History:  Procedure Laterality Date   BASAL CELL CARCINOMA EXCISION     arm and leg   COLONOSCOPY  2013   cleared for 5 yrs- Dr Marcus Gonzalez   COLONOSCOPY WITH PROPOFOL N/A 08/22/2017   Procedure: COLONOSCOPY WITH PROPOFOL;  Surgeon: Marcus Lame, MD;  Location: [place];  Service: Endoscopy;  Laterality: N/A;   POLYPECTOMY  08/22/2017   Procedure: POLYPECTOMY INTESTINAL;  Surgeon: Marcus Lame, MD;  Location: [place];  Service: Endoscopy;;   skin ca removed     Patient Active Problem List   Diagnosis Date Noted   Closed fracture of left distal fibula 09/10/2021   Abdominal aortic atherosclerosis ([place]) 07/07/2020   Arthralgia of hip 06/03/2018   Arthritis of foot 06/03/2018   Encounter for screening colonoscopy    Benign neoplasm of ascending colon    Polyp of sigmoid colon    Rectal polyp    Primary localized osteoarthrosis, hand 05/05/2017   Hyperlipidemia 11/29/2016   Degeneration, intervertebral disc, cervical 04/19/2016   Migraine without status migrainosus, not intractable 04/19/2016   Essential hypertension 04/19/2016   Cephalalgia 05/24/2014   Routine general medical examination at a health care facility 05/24/2014   Anxiety attack  05/24/2014   Arthritis 05/24/2014   Benign prostatic hyperplasia without lower urinary tract symptoms 05/24/2014   Narrowing of intervertebral disc space 05/24/2014   History of migraine headaches 05/24/2014   Personal history of other malignant neoplasm of skin 02/28/2012    PCP: Marcus Patch, MD  REFERRING PROVIDER: [name], MD  REFERRING DIAG: [phone] (ICD-10-CM) - Closed fracture of distal end of left fibula with routine healing, unspecified fracture morphology, subsequent encounter  THERAPY DIAG:  Muscle weakness (generalized)  Difficulty in walking, not elsewhere classified  Pain in left ankle and joints of left foot  Rationale for Evaluation and Treatment: Rehabilitation  ONSET DATE: 08/2021  SUBJECTIVE:   SUBJECTIVE STATEMENT: Patient notes no known injury; figures he must have stepped wrong. Patient notes at this point the injury is more of an aggravation than anything. Patient notes no limitation with L ankle, but does feel limited wearing CAM boot on steps. Patient states that he has occasional twinges of pain for a minute or two. Patient was able to walk to and from [facility] football [place] this past weekend in CAM boot without issue. Worst pain in the past week is 1/10.   PERTINENT HISTORY: 11/30/2021 Marcus Gonzalez): Presents for follow-up to distal fibular fracture, has been compliant with cam boot in and out of the house, performing some exercises when he is able  to do so.  Describes pain being minimal, occurring sporadically, lasting 5-30 minutes, alleviated with time and rest.  No new symptoms.   Examination with mild bony prominence that is minimally tender at the distal fibula consistent with fracture site, tenderness to percussion noted as well, range of motion consistent with immobilization.   Given his x-rays demonstrating continued interval callus without interval malalignment, advised continued cam boot while outside of the home, start of formal physical  therapy, gradual wean and discontinuation of boot while inside the home.  Continued home exercises to be overseen by PT.  He is to finish out remaining Rx vitamin D, follow-up in 1 month with repeat x-rays done prior to visit.  Evaluate and Treat.  Left distal fibular fracture, healing, heavy focus on strengthening and stabilization, address any deconditioning secondary to necessary cam boot usage.  Oversee home-based program. 2 times per week for 4-6 weeks. Decrease pain, increase strength, flexibility, function, and range of motion. Modalities may include, traction, ionto, phono, and stim. May include dry needling with or without stim.  09/10/2021 Marcus Gonzalez): 2 week history of left lateral ankle pain, uncertain about specific trauma but does not rule this out such as inversion, denies any swelling or ecchymosis, no significant change in activity, has been able to ambulate though with increasing pain.  Examination shows focal tenderness slightly proximal to the lateral malleolus, nontender lateral medial ligaments, no laxity throughout the joint, provocative testing otherwise benign, nontender at the proximal fibular head.  X-rays do demonstrate nondisplaced oblique fracture through the distal fibula.  Given the fact that he has been ambulating this for 2 weeks, in fact travel to [place], we will immobilize with cam boot due to the pain with weightbearing, start Rx course of vitamin D, and have him return in 4 weeks.  He will wean from cam boot while at home in 2 weeks while starting home-based rehab right away.  New x-rays are to be obtained at his return. PAIN:  Are you having pain? No  PRECAUTIONS: None  WEIGHT BEARING RESTRICTIONS: No  FALLS:  Has patient fallen in last 6 months? Yes. 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  PATIENT SURVEYS:  FOTO 68; predicted 94 in 11 visits  COGNITION: Overall cognitive status: Within functional limits for tasks assessed     SENSATION: WFL  EDEMA:  None noted  POSTURE: Standing posture unshod is symmetrical; posteriorly tilted pelvis with diminished lumbar lordosis and mildly increased thoracic kyphosis. Reliant on Y ligaments for postural stability.   PALPATION: TTP along peroneal tendons on LLE, some tenderness noted on palpation of L distal fibula and anterior inferior tibiofibular ligament space. No tenderness to RLE palpation.  LOWER EXTREMITY AROM: RLE: combined PF/DF 25 degrees total, inversion 28, eversion 20  LLE: combined PF/DF 22 degrees total, inversion 21, eversion 18  LOWER EXTREMITY MMT:  MMT Right eval Left eval  Hip flexion    Hip extension    Hip abduction    Hip adduction    Hip internal rotation    Hip external rotation    Knee flexion    Knee extension    Ankle dorsiflexion 5 4  Ankle  plantarflexion 4 4  Ankle inversion 5 5  Ankle eversion 5 5   (Blank rows = not tested)  LOWER EXTREMITY SPECIAL TESTS:  Ankle special tests: Dorsiflexion-Eversion test: negative  FUNCTIONAL TESTS:  STS: no UE support, WFL  GAIT: Patient has lilting gait with CAM boot but no profound unsteadiness.  Unshod gait reveals symmetrical WB, limited GTE and hip extension B.    TREATMENT- 12/05/2021 SUBJECTIVE: Patient notes that he had no issues with gym program yesterday. Patient was able to use elliptical for 10 min (8 min fwd, 2 bwd).  PAIN: 0/10 Pre-treatment assessment:  Manual Therapy:   Neuromuscular Re-education:   Therapeutic Exercise: NuStep, L5, BLE, x5 min Gastroc stair stretch Lateral step up, x15 BLE Calf raises neutral, x10 Calf raises in ER,  x15 Calf raises with LE adduction isometric x15 Airex calf raises x15 Airex toe raises x15 Airex SLS 10 sec bouts BOSU lunges fwd x15 BOSU lunges lateral x15 STS with GTB hip abduction isometric x12 Lateral walk with GTB x6 laps in // bars  Treatments unbilled:  Post-treatment assessment:  Patient educated throughout session on appropriate technique and form using multi-modal cueing, HEP, and activity modification. Patient articulated understanding and returned demonstration.  Patient Response to interventions: -1/10 pain  HOME EXERCISE PROGRAM: [id number]  ASSESSMENT:  CLINICAL IMPRESSION: Patient presents to clinic with excellent motivation to participate in therapy. Patient demonstrates deficits in L ankle strength, activity tolerance, and L ankle ROM. Patient able to perform all interventions without any onset of ankle pain and with adequate form during today's session and responded positively to active interventions. Patient will benefit from continued skilled therapeutic intervention to address remaining deficits in L ankle strength, activity tolerance, and L ankle ROM in order to increase function and improve overall QOL.   OBJECTIVE IMPAIRMENTS: Abnormal gait, decreased activity tolerance, decreased balance, decreased coordination, decreased endurance, difficulty walking, decreased ROM, improper body mechanics, postural dysfunction, and pain.   ACTIVITY LIMITATIONS: carrying, standing, squatting, stairs, and locomotion level  PARTICIPATION LIMITATIONS: shopping, community activity, and yard work  PERSONAL FACTORS: Age, Behavior pattern, Past/current experiences, Time since onset of injury/illness/exacerbation, and 3+ comorbidities: BPH, DDD, migraine, prostate cancer, arthritis, HLD, HTN  are also affecting patient's functional outcome.   REHAB POTENTIAL: Excellent  CLINICAL DECISION MAKING: Stable/uncomplicated  EVALUATION COMPLEXITY: Low   GOALS: Goals reviewed with  patient? Yes  LONG TERM GOALS: Target date: 01/14/2022   Patient will demonstrate improved function as evidenced by a score of 78 on FOTO measure for full participation in activities at home and in the community.  Baseline: 68 Goal status: INITIAL  2.  Patient will demonstrate clinically significant improved ankle strength and stability as evidenced by ability to perform SLS heel raise for 25 repetitions with fingertip support bilaterally. Baseline: LLE 25% UE support 20 reps, RLE fingertip 20 reps Goal status: INITIAL  3.  Patient will be able to ambulate 10K steps over a 24 hour period, pain < 2/10, without AD/CAM boot in order to return to PLOF. Baseline: 5-6K Goal status: INITIAL  PLAN:  PT FREQUENCY: 1-2x/week  PT DURATION: 6 weeks  PLANNED INTERVENTIONS: Therapeutic exercises, Therapeutic activity, Neuromuscular re-education, Balance training, Gait training, Patient/Family education, Self Care, Joint mobilization, Orthotic/Fit training, Dry Needling, Electrical stimulation, Spinal mobilization, Cryotherapy, Moist heat, Taping, Ionotophoresis '4mg'$ /ml Dexamethasone, and Manual therapy  PLAN FOR NEXT SESSION: gym-based strengthening, balance training   Myles Gip PT, DPT [phone]  12/05/2021, 8:59 AM

## 2021-12-13 ENCOUNTER — Encounter: Payer: Self-pay | Admitting: Physical Therapy

## 2021-12-13 ENCOUNTER — Ambulatory Visit: Payer: Medicare Other | Admitting: Physical Therapy

## 2021-12-13 DIAGNOSIS — M6281 Muscle weakness (generalized): Secondary | ICD-10-CM | POA: Diagnosis not present

## 2021-12-13 DIAGNOSIS — M25572 Pain in left ankle and joints of left foot: Secondary | ICD-10-CM

## 2021-12-13 DIAGNOSIS — S82832D Other fracture of upper and lower end of left fibula, subsequent encounter for closed fracture with routine healing: Secondary | ICD-10-CM | POA: Diagnosis not present

## 2021-12-13 DIAGNOSIS — R262 Difficulty in walking, not elsewhere classified: Secondary | ICD-10-CM | POA: Diagnosis not present

## 2021-12-13 NOTE — Therapy (Signed)
OUTPATIENT PHYSICAL THERAPY LOWER EXTREMITY TREATMENT   Patient Name: Marcus Gonzalez MRN: [id number] DOB:07/04/1950, 71 y.o., male Today's Date: 12/13/2021   PT End of Session - 12/13/21 1642     Visit Number 3    Number of Visits 13    Date for PT Re-Evaluation 01/14/22    Authorization Type IE 12/03/2021    PT Start Time 1645    PT Stop Time 1725    PT Time Calculation (min) 40 min    Activity Tolerance Patient tolerated treatment well    Behavior During Therapy [facility] for tasks assessed/performed             Past Medical History:  Diagnosis Date   BPH (benign prostatic hyperplasia)    Degenerative disc disease, cervical    Headache    Hip pain    Migraine    Prostate cancer [facility])    Past Surgical History:  Procedure Laterality Date   BASAL CELL CARCINOMA EXCISION     arm and leg   COLONOSCOPY  2013   cleared for 5 yrs- Dr Allen Norris   COLONOSCOPY WITH PROPOFOL N/A 08/22/2017   Procedure: COLONOSCOPY WITH PROPOFOL;  Surgeon: Lucilla Lame, MD;  Location: [place];  Service: Endoscopy;  Laterality: N/A;   POLYPECTOMY  08/22/2017   Procedure: POLYPECTOMY INTESTINAL;  Surgeon: Lucilla Lame, MD;  Location: [place];  Service: Endoscopy;;   skin ca removed     Patient Active Problem List   Diagnosis Date Noted   Closed fracture of left distal fibula 09/10/2021   Abdominal aortic atherosclerosis ([place]) 07/07/2020   Arthralgia of hip 06/03/2018   Arthritis of foot 06/03/2018   Encounter for screening colonoscopy    Benign neoplasm of ascending colon    Polyp of sigmoid colon    Rectal polyp    Primary localized osteoarthrosis, hand 05/05/2017   Hyperlipidemia 11/29/2016   Degeneration, intervertebral disc, cervical 04/19/2016   Migraine without status migrainosus, not intractable 04/19/2016   Essential hypertension 04/19/2016   Cephalalgia 05/24/2014   Routine general medical examination at a health care facility 05/24/2014   Anxiety attack  05/24/2014   Arthritis 05/24/2014   Benign prostatic hyperplasia without lower urinary tract symptoms 05/24/2014   Narrowing of intervertebral disc space 05/24/2014   History of migraine headaches 05/24/2014   Personal history of other malignant neoplasm of skin 02/28/2012    PCP: Juline Patch, MD  REFERRING PROVIDER: [name], MD  REFERRING DIAG: [phone] (ICD-10-CM) - Closed fracture of distal end of left fibula with routine healing, unspecified fracture morphology, subsequent encounter  THERAPY DIAG:  Muscle weakness (generalized)  Difficulty in walking, not elsewhere classified  Pain in left ankle and joints of left foot  Rationale for Evaluation and Treatment: Rehabilitation  ONSET DATE: 08/2021  SUBJECTIVE:   SUBJECTIVE STATEMENT: Patient notes no known injury; figures he must have stepped wrong. Patient notes at this point the injury is more of an aggravation than anything. Patient notes no limitation with L ankle, but does feel limited wearing CAM boot on steps. Patient states that he has occasional twinges of pain for a minute or two. Patient was able to walk to and from [facility] [place] this past weekend in CAM boot without issue. Worst pain in the past week is 1/10.   PERTINENT HISTORY: 11/30/2021 [name]): Presents for follow-up to distal fibular fracture, has been compliant with cam boot in and out of the house, performing some exercises when he is able  to do so.  Describes pain being minimal, occurring sporadically, lasting 5-30 minutes, alleviated with time and rest.  No new symptoms.   Examination with mild bony prominence that is minimally tender at the distal fibula consistent with fracture site, tenderness to percussion noted as well, range of motion consistent with immobilization.   Given his x-rays demonstrating continued interval callus without interval malalignment, advised continued cam boot while outside of the home, start of formal physical  therapy, gradual wean and discontinuation of boot while inside the home.  Continued home exercises to be overseen by PT.  He is to finish out remaining Rx vitamin D, follow-up in 1 month with repeat x-rays done prior to visit.  Evaluate and Treat.  Left distal fibular fracture, healing, heavy focus on strengthening and stabilization, address any deconditioning secondary to necessary cam boot usage.  Oversee home-based program. 2 times per week for 4-6 weeks. Decrease pain, increase strength, flexibility, function, and range of motion. Modalities may include, traction, ionto, phono, and stim. May include dry needling with or without stim.  09/10/2021 [name]): 2 week history of left lateral ankle pain, uncertain about specific trauma but does not rule this out such as inversion, denies any swelling or ecchymosis, no significant change in activity, has been able to ambulate though with increasing pain.  Examination shows focal tenderness slightly proximal to the lateral malleolus, nontender lateral medial ligaments, no laxity throughout the joint, provocative testing otherwise benign, nontender at the proximal fibular head.  X-rays do demonstrate nondisplaced oblique fracture through the distal fibula.  Given the fact that he has been ambulating this for 2 weeks, in fact travel to [place], we will immobilize with cam boot due to the pain with weightbearing, start Rx course of vitamin D, and have him return in 4 weeks.  He will wean from cam boot while at home in 2 weeks while starting home-based rehab right away.  New x-rays are to be obtained at his return. PAIN:  Are you having pain? No  PRECAUTIONS: None  WEIGHT BEARING RESTRICTIONS: No  FALLS:  Has patient fallen in last 6 months? Yes. 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  PATIENT SURVEYS:  FOTO 68; predicted 94 in 11 visits  COGNITION: Overall cognitive status: Within functional limits for tasks assessed     SENSATION: WFL  EDEMA:  None noted  POSTURE: Standing posture unshod is symmetrical; posteriorly tilted pelvis with diminished lumbar lordosis and mildly increased thoracic kyphosis. Reliant on Y ligaments for postural stability.   PALPATION: TTP along peroneal tendons on LLE, some tenderness noted on palpation of L distal fibula and anterior inferior tibiofibular ligament space. No tenderness to RLE palpation.  LOWER EXTREMITY AROM: RLE: combined PF/DF 25 degrees total, inversion 28, eversion 20  LLE: combined PF/DF 22 degrees total, inversion 21, eversion 18  LOWER EXTREMITY MMT:  MMT Right eval Left eval  Hip flexion    Hip extension    Hip abduction    Hip adduction    Hip internal rotation    Hip external rotation    Knee flexion    Knee extension    Ankle dorsiflexion 5 4  Ankle  plantarflexion 4 4  Ankle inversion 5 5  Ankle eversion 5 5   (Blank rows = not tested)  LOWER EXTREMITY SPECIAL TESTS:  Ankle special tests: Dorsiflexion-Eversion test: negative  FUNCTIONAL TESTS:  STS: no UE support, WFL  GAIT: Patient has lilting gait with CAM boot but no profound unsteadiness.  Unshod gait reveals symmetrical WB, limited GTE and hip extension B.    TREATMENT- 12/13/2021 SUBJECTIVE: Patient presents to clinic after trip to [facility]. Patient walked > 10K steps every day on the trip with minimal pain. Patient did utilize boot for longer walking, but took boot off for short duration/low intensity walking. Patient states that he feels comfortable to discharge to self-management having had such a positive experience with the recent trip and amount  of walking he was able to do. He states that worst pain while away was 3/10 and was very brief. Patient did have some soreness along peroneal tendons, but nothing limiting.  PAIN: 0/10 Pre-treatment assessment:  Manual Therapy:   Neuromuscular Re-education:   Therapeutic Exercise: NuStep, L5, BLE, x5 min Reassessed goals; see below.  Airex balance beam tandem gait Airex balance beam lateral gait Discussed considerations for ankle stability on December trip including compressive brace if needed.  Treatments unbilled:  Post-treatment assessment:  Patient educated throughout session on appropriate technique and form using multi-modal cueing, HEP, and activity modification. Patient articulated understanding and returned demonstration.  Patient Response to interventions: Comfortable to discharge  HOME EXERCISE PROGRAM: [id number]  ASSESSMENT:  CLINICAL IMPRESSION: Patient presents to clinic expressing readiness to discharge to self-management.  Patient demonstrates negligible remaining deficits in L ankle strength, activity tolerance, and L ankle ROM. Patient has achieved or made progress toward all goals set forth and indicates competence and confidence with self-management. Patient may benefit from future skilled therapeutic intervention to address any returning deficits in L ankle strength, activity tolerance, and L ankle ROM in order to increase function and improve overall QOL.   OBJECTIVE IMPAIRMENTS: Abnormal gait, decreased activity tolerance, decreased balance, decreased coordination, decreased endurance, difficulty walking, decreased ROM, improper body mechanics, postural dysfunction, and pain.   ACTIVITY LIMITATIONS: carrying, standing, squatting, stairs, and locomotion level  PARTICIPATION LIMITATIONS: shopping, community activity, and yard work  PERSONAL FACTORS: Age, Behavior pattern, Past/current experiences, Time since onset of injury/illness/exacerbation, and 3+  comorbidities: BPH, DDD, migraine, prostate cancer, arthritis, HLD, HTN  are also affecting patient's functional outcome.   REHAB POTENTIAL: Excellent  CLINICAL DECISION MAKING: Stable/uncomplicated  EVALUATION COMPLEXITY: Low   GOALS: Goals reviewed with patient? Yes  LONG TERM GOALS: Target date: 01/14/2022   Patient will demonstrate improved function as evidenced by a score of 78 on FOTO measure for full participation in activities at home and in the community.  Baseline: 68; 11/16: 99 Goal status: MET  2.  Patient will demonstrate clinically significant improved ankle strength and stability as evidenced by ability to perform SLS heel raise for 25 repetitions with fingertip support bilaterally. Baseline: LLE 25% UE support 20 reps, RLE fingertip 20 reps, 11/16: BLE, fingertip support, x25 reps with > 1 inch heel clearance for each rep Goal status: MET  3.  Patient will be able to ambulate 10K steps over a 24 hour period, pain < 2/10, without AD/CAM boot in order to return to PLOF. Baseline: 5-6K; 11/16: 10K+ steps with boot, 0/10 pain Goal status: IN PROGRESS  PLAN:  PT FREQUENCY: 1-2x/week  PT DURATION: 6 weeks  PLANNED INTERVENTIONS: Therapeutic exercises, Therapeutic activity, Neuromuscular re-education, Balance training,  Gait training, Patient/Family education, Self Care, Joint mobilization, Orthotic/Fit training, Dry Needling, Electrical stimulation, Spinal mobilization, Cryotherapy, Moist heat, Taping, Ionotophoresis 42m/ml Dexamethasone, and Manual therapy  PLAN FOR NEXT SESSION: gym-based strengthening, balance training   KMyles GipPT, DPT #[phone] 12/13/2021, 4:55 PM

## 2021-12-17 ENCOUNTER — Encounter: Payer: Medicare Other | Admitting: Physical Therapy

## 2021-12-19 ENCOUNTER — Encounter: Payer: Medicare Other | Admitting: Physical Therapy

## 2021-12-24 ENCOUNTER — Ambulatory Visit (INDEPENDENT_AMBULATORY_CARE_PROVIDER_SITE_OTHER): Payer: Medicare Other | Admitting: Family Medicine

## 2021-12-24 ENCOUNTER — Ambulatory Visit
Admission: RE | Admit: 2021-12-24 | Discharge: 2021-12-24 | Disposition: A | Discharge status: Home / Self Care | Payer: Medicare Other | Attending: Family Medicine | Admitting: Family Medicine

## 2021-12-24 ENCOUNTER — Other Ambulatory Visit: Payer: Self-pay

## 2021-12-24 ENCOUNTER — Encounter: Payer: Medicare Other | Admitting: Physical Therapy

## 2021-12-24 ENCOUNTER — Encounter: Payer: Self-pay | Admitting: Family Medicine

## 2021-12-24 ENCOUNTER — Ambulatory Visit
Admission: RE | Admit: 2021-12-24 | Discharge: 2021-12-24 | Disposition: A | Discharge status: Home / Self Care | Payer: Medicare Other | Source: Ambulatory Visit | Attending: Family Medicine | Admitting: Family Medicine

## 2021-12-24 DIAGNOSIS — S82832D Other fracture of upper and lower end of left fibula, subsequent encounter for closed fracture with routine healing: Secondary | ICD-10-CM | POA: Insufficient documentation

## 2021-12-24 DIAGNOSIS — G43909 Migraine, unspecified, not intractable, without status migrainosus: Secondary | ICD-10-CM | POA: Diagnosis not present

## 2021-12-24 DIAGNOSIS — I10 Essential (primary) hypertension: Secondary | ICD-10-CM

## 2021-12-24 DIAGNOSIS — M5126 Other intervertebral disc displacement, lumbar region: Secondary | ICD-10-CM | POA: Diagnosis not present

## 2021-12-24 DIAGNOSIS — B356 Tinea cruris: Secondary | ICD-10-CM

## 2021-12-24 MED ORDER — AMLODIPINE BESYLATE 2.5 MG PO TABS: 2.5000 mg | ORAL_TABLET | Freq: Every day | ORAL | 1 refills | Status: DC

## 2021-12-24 MED ORDER — TRAMADOL HCL 50 MG PO TABS: 50.0000 mg | ORAL_TABLET | Freq: Three times a day (TID) | ORAL | 0 refills | Status: AC | PRN

## 2021-12-24 MED ORDER — LISINOPRIL 20 MG PO TABS: 20.0000 mg | ORAL_TABLET | Freq: Every day | ORAL | 1 refills | Status: DC

## 2021-12-24 MED ORDER — CLOTRIMAZOLE-BETAMETHASONE 1-0.05 % EX CREA: 1.0000 | TOPICAL_CREAM | Freq: Every day | CUTANEOUS | 2 refills | Status: DC

## 2021-12-24 MED ORDER — MELOXICAM 15 MG PO TABS: 15.0000 mg | ORAL_TABLET | Freq: Every day | ORAL | 1 refills | Status: DC

## 2021-12-24 MED ORDER — PROPRANOLOL HCL ER 80 MG PO CP24: 80.0000 mg | ORAL_CAPSULE | Freq: Every day | ORAL | 1 refills | Status: DC

## 2021-12-24 NOTE — Progress Notes (Signed)
Date:  12/24/2021   Name:  Marcus Gonzalez   DOB:  [birth year]   MRN:  [id number]   Chief Complaint: Hip Pain, Hypertension, and Headache  Hip Pain  There was no injury mechanism. The pain is present in the left hip. The pain is mild. The pain has been Intermittent since onset. Pertinent negatives include no loss of motion, loss of sensation, numbness or tingling. The treatment provided moderate relief.  Hypertension This is a chronic problem. The current episode started more than 1 year ago. The problem has been gradually improving since onset. The problem is controlled. Associated symptoms include headaches. Pertinent negatives include no blurred vision, chest pain, neck pain, palpitations, peripheral edema, PND or shortness of breath. Past treatments include calcium channel blockers and ACE inhibitors. The current treatment provides moderate improvement. There are no compliance problems.  There is no history of angina, kidney disease, CAD/MI, CVA, heart failure, left ventricular hypertrophy, PVD or retinopathy. There is no history of chronic renal disease, a hypertension causing med or renovascular disease.  Headache  This is a chronic problem. The current episode started more than 1 year ago. The problem occurs intermittently. The problem has been gradually improving. The pain is located in the Bilateral region. The pain is moderate. Associated symptoms include back pain. Pertinent negatives include no blurred vision, fever, neck pain, numbness or tingling. His past medical history is significant for hypertension.    Lab Results  Component Value Date   NA 140 06/20/2021   K 4.8 06/20/2021   CO2 23 06/20/2021   GLUCOSE 91 06/20/2021   BUN 13 06/20/2021   CREATININE 0.94 06/20/2021   CALCIUM 10.0 06/20/2021   EGFR 87 06/20/2021   GFRNONAA 74 12/16/2019   Lab Results  Component Value Date   CHOL 162 06/20/2021   HDL 68 06/20/2021   LDLCALC 78 06/20/2021   TRIG 85 06/20/2021    CHOLHDL 2.3 11/29/2016   No results found for: "TSH" No results found for: "HGBA1C" No results found for: "WBC", "HGB", "HCT", "MCV", "PLT" Lab Results  Component Value Date   ALT 22 06/20/2021   AST 24 06/20/2021   ALKPHOS 66 06/20/2021   BILITOT 0.6 06/20/2021   No results found for: "25OHVITD2", "25OHVITD3", "VD25OH"   Review of Systems  Constitutional:  Negative for fatigue and fever.  Eyes:  Negative for blurred vision.  Respiratory:  Negative for chest tightness, shortness of breath and wheezing.   Cardiovascular:  Negative for chest pain, palpitations, leg swelling and PND.  Musculoskeletal:  Positive for back pain. Negative for neck pain.  Neurological:  Positive for headaches. Negative for tingling, light-headedness and numbness.    Patient Active Problem List   Diagnosis Date Noted   Closed fracture of left distal fibula 09/10/2021   Abdominal aortic atherosclerosis ([place]) 07/07/2020   Arthralgia of hip 06/03/2018   Arthritis of foot 06/03/2018   Encounter for screening colonoscopy    Benign neoplasm of ascending colon    Polyp of sigmoid colon    Rectal polyp    Primary localized osteoarthrosis, hand 05/05/2017   Hyperlipidemia 11/29/2016   Degeneration, intervertebral disc, cervical 04/19/2016   Migraine without status migrainosus, not intractable 04/19/2016   Essential hypertension 04/19/2016   Cephalalgia 05/24/2014   Routine general medical examination at a health care facility 05/24/2014   Anxiety attack 05/24/2014   Arthritis 05/24/2014   Benign prostatic hyperplasia without lower urinary tract symptoms 05/24/2014   Narrowing of intervertebral disc space 05/24/2014  History of migraine headaches 05/24/2014   Personal history of other malignant neoplasm of skin 02/28/2012    Allergies  Allergen Reactions   Cephalexin     Past Surgical History:  Procedure Laterality Date   BASAL CELL CARCINOMA EXCISION     arm and leg   COLONOSCOPY  2013    cleared for 5 yrs- Dr Allen Norris   COLONOSCOPY WITH PROPOFOL N/A 08/22/2017   Procedure: COLONOSCOPY WITH PROPOFOL;  Surgeon: Lucilla Lame, MD;  Location: [facility];  Service: Endoscopy;  Laterality: N/A;   POLYPECTOMY  08/22/2017   Procedure: POLYPECTOMY INTESTINAL;  Surgeon: Lucilla Lame, MD;  Location: [facility];  Service: Endoscopy;;   skin ca removed      Social History   Tobacco Use   Smoking status: Former    Packs/day: 1.00    Years: 29.00    Total pack years: 29.00    Types: Cigarettes    Quit date: 1999    Years since quitting: 24.9   Smokeless tobacco: Never   Tobacco comments:    smoking cessation materials not required  Vaping Use   Vaping Use: Never used  Substance Use Topics   Alcohol use: Yes    Alcohol/week: 14.0 standard drinks of alcohol    Types: 14 Glasses of wine per week   Drug use: No     Medication list has been reviewed and updated.  Current Meds  Medication Sig   amLODipine (NORVASC) 2.5 MG tablet Take 1 tablet (2.5 mg total) by mouth daily.   Apple Cider Vinegar 188 MG CAPS Take by mouth.   Ascorbic Acid (VITAMIN C) 100 MG tablet Take by mouth.   B Complex Vitamins (VITAMIN B-COMPLEX) TABS Take by mouth.   Bee Pollen 1000 MG TABS Take by mouth.   Bilberry 100 MG CAPS Take by mouth.   Calcium Citrate 1040 MG TABS Take 2 tablets by mouth daily.   Cinnamon 500 MG capsule Take by mouth.   clotrimazole-betamethasone (LOTRISONE) cream Apply 1 application. topically daily.   Coenzyme Q10 (COQ-10) 100 MG CAPS Take by mouth.   Cranberry 200 MG CAPS Take by mouth.   cyclobenzaprine (FLEXERIL) 10 MG tablet Take 1 tablet (10 mg total) by mouth at bedtime as needed for muscle spasms.   Echinacea 500 MG CAPS Take by mouth.   Flaxseed, Linseed, (FLAXSEED OIL PO) Take by mouth.   Garlic 6579 MG CAPS Take by mouth.   Glucosamine-Chondroitin (GLUCOSAMINE CHONDR COMPLEX PO) Take by mouth.   lisinopril (ZESTRIL) 20 MG tablet TAKE ONE TABLET BY  MOUTH ONE TIME DAILY   meloxicam (MOBIC) 15 MG tablet TAKE ONE TABLET BY MOUTH ONE TIME DAILY   Multiple Vitamins-Minerals (MULTIVITAMIN WITH MINERALS) tablet Take 1 tablet by mouth daily.   propranolol ER (INDERAL LA) 80 MG 24 hr capsule Take 1 capsule (80 mg total) by mouth daily.   Shark Cartilage 500 MG CAPS Take by mouth.   tadalafil (CIALIS) 20 MG tablet Take one tablet PRN for erectile dysfunction   UNABLE TO FIND [employer] toxifier/regenerator herb   [DISCONTINUED] lisinopril (ZESTRIL) 20 MG tablet Take 1 tablet (20 mg total) by mouth daily.   [DISCONTINUED] meloxicam (MOBIC) 15 MG tablet Take 1 tablet (15 mg total) by mouth daily.       12/24/2021   10:00 AM 09/10/2021    8:26 AM 09/06/2021    8:01 AM 06/20/2021    9:32 AM  GAD 7 : Generalized Anxiety Score  Nervous, Anxious, on  Edge 0 1 0 1  Control/stop worrying 0 0 0 0  Worry too much - different things 0 0 0 0  Trouble relaxing 0 0 0 0  Restless 0 0 0 0  Easily annoyed or irritable 0 1 0 1  Afraid - awful might happen 0 0 0 0  Total GAD 7 Score 0 2 0 2  Anxiety Difficulty Not difficult at all Not difficult at all Not difficult at all Not difficult at all       12/24/2021    9:59 AM 09/10/2021    8:26 AM 09/06/2021    8:01 AM  Depression screen PHQ 2/9  Decreased Interest 0 0 0  Down, Depressed, Hopeless 0 0 0  PHQ - 2 Score 0 0 0  Altered sleeping 0 0 0  Tired, decreased energy 0 0 0  Change in appetite 0 0 0  Feeling bad or failure about yourself  0 0 0  Trouble concentrating 0 0 0  Moving slowly or fidgety/restless 0 0 0  Suicidal thoughts 0 0 0  PHQ-9 Score 0 0 0  Difficult doing work/chores Not difficult at all Not difficult at all Not difficult at all    BP Readings from Last 3 Encounters:  12/24/21 (!) 142/98  11/30/21 120/82  11/02/21 130/80    Physical Exam HENT:     Head: Normocephalic.     Right Ear: External ear normal.     Left Ear: External ear normal.     Nose: Nose normal.  Eyes:      General: No scleral icterus.       Right eye: No discharge.        Left eye: No discharge.     Conjunctiva/sclera: Conjunctivae normal.     Pupils: Pupils are equal, round, and reactive to light.  Neck:     Thyroid: No thyromegaly.     Vascular: No JVD.     Trachea: No tracheal deviation.  Cardiovascular:     Rate and Rhythm: Normal rate and regular rhythm.     Heart sounds: Normal heart sounds. No murmur heard.    No friction rub. No gallop.  Pulmonary:     Effort: No respiratory distress.     Breath sounds: Normal breath sounds. No wheezing or rales.  Abdominal:     General: Bowel sounds are normal.     Palpations: Abdomen is soft. There is no mass.     Tenderness: There is no abdominal tenderness. There is no guarding or rebound.  Musculoskeletal:        General: No tenderness. Normal range of motion.     Cervical back: Normal range of motion and neck supple.  Lymphadenopathy:     Cervical: No cervical adenopathy.  Skin:    General: Skin is warm.     Findings: No rash.  Neurological:     Mental Status: He is alert and oriented to person, place, and time.     Cranial Nerves: No cranial nerve deficit.     Deep Tendon Reflexes: Reflexes are normal and symmetric.     Wt Readings from Last 3 Encounters:  12/24/21 170 lb (77.1 kg)  10/18/21 170 lb (77.1 kg)  10/11/21 170 lb (77.1 kg)    BP (!) 142/98 (BP Location: Right Arm, Cuff Size: Normal)   Pulse 80   Ht _0  (1.753 m)   Wt 170 lb (77.1 kg)   SpO2 97%   BMI 25.10 kg/m   Assessment  and Plan:  1. Lumbar herniated disc Chronic.  Episodic.  Stable.  Patient is currently controlled but is having episodes of back pain which are related to increase activity whether it be in the gym or on vacation moving luggage.  We will refill meloxicam 15 mg to take on a regular basis but patient may have tramadol 50 mg on an as-needed basis when pain is persistent in an out-of-town location. - meloxicam (MOBIC) 15 MG tablet; Take 1  tablet (15 mg total) by mouth daily.  Dispense: 90 tablet; Refill: 1 - traMADol (ULTRAM) 50 MG tablet; Take 1 tablet (50 mg total) by mouth every 8 (eight) hours as needed for up to 5 days.  Dispense: 15 tablet; Refill: 0  2. Essential hypertension Chronic.  Controlled.  Stable.  Blood pressure 136/88.  Continue amlodipine 2.5 mg once a day, lisinopril 20 mg once a day, and propranolol ER 80 mg once a day.  Will check renal function panel for electrolytes and GFR. - amLODipine (NORVASC) 2.5 MG tablet; Take 1 tablet (2.5 mg total) by mouth daily.  Dispense: 90 tablet; Refill: 1 - lisinopril (ZESTRIL) 20 MG tablet; Take 1 tablet (20 mg total) by mouth daily.  Dispense: 90 tablet; Refill: 1 - propranolol ER (INDERAL LA) 80 MG 24 hr capsule; Take 1 capsule (80 mg total) by mouth daily.  Dispense: 90 capsule; Refill: 1 - Renal Function Panel  3. Migraine without status migrainosus, not intractable, unspecified migraine type Chronic.  Controlled.  Stable.  Patient takes propranolol also for suppression of migraines. - propranolol ER (INDERAL LA) 80 MG 24 hr capsule; Take 1 capsule (80 mg total) by mouth daily.  Dispense: 90 capsule; Refill: 1  4. Tinea inguinalis Chronic episodic.  Stable.  Breakthrough due to sweating and areas that are high in temperature i.e. [place] in the winter.  Will refill Lotrisone cream to be used on a as needed basis. - clotrimazole-betamethasone (LOTRISONE) cream; Apply 1 Application topically daily.  Dispense: 30 g; Refill: 2    Otilio Miu, MD

## 2021-12-25 LAB — RENAL FUNCTION PANEL
Albumin: 4.7 g/dL (ref 3.8–4.8)
BUN/Creatinine Ratio: 18 (ref 10–24)
BUN: 19 mg/dL (ref 8–27)
CO2: 24 mmol/L (ref 20–29)
Calcium: 10 mg/dL (ref 8.6–10.2)
Chloride: 103 mmol/L (ref 96–106)
Creatinine, Ser: 1.04 mg/dL (ref 0.76–1.27)
Glucose: 96 mg/dL (ref 70–99)
Phosphorus: 3.6 mg/dL (ref 2.8–4.1)
Potassium: 4.8 mmol/L (ref 3.5–5.2)
Sodium: 141 mmol/L (ref 134–144)
eGFR: 76 mL/min/{1.73_m2} (ref >59)

## 2021-12-25 NOTE — Progress Notes (Signed)
Contacted patient via telephone call at roughly 1210, relayed x-ray results demonstrating interval healing, he has minimal symptoms despite returning to the gym.  I have encouraged patient to gradually ramp up activity using outer ankle symptoms as a guide and to contact our office during the January 2024 timeframe if symptoms persist without improvement.  At that time repeat x-rays and an office visit to be coordinated.

## 2021-12-26 ENCOUNTER — Ambulatory Visit: Payer: Medicare Other | Admitting: Family Medicine

## 2021-12-26 ENCOUNTER — Encounter: Payer: Medicare Other | Admitting: Physical Therapy

## 2022-03-13 DIAGNOSIS — C61 Malignant neoplasm of prostate: Secondary | ICD-10-CM | POA: Diagnosis not present

## 2022-03-25 DIAGNOSIS — D2261 Melanocytic nevi of right upper limb, including shoulder: Secondary | ICD-10-CM | POA: Diagnosis not present

## 2022-03-25 DIAGNOSIS — L570 Actinic keratosis: Secondary | ICD-10-CM | POA: Diagnosis not present

## 2022-03-25 DIAGNOSIS — D485 Neoplasm of uncertain behavior of skin: Secondary | ICD-10-CM | POA: Diagnosis not present

## 2022-03-25 DIAGNOSIS — D2271 Melanocytic nevi of right lower limb, including hip: Secondary | ICD-10-CM | POA: Diagnosis not present

## 2022-03-25 DIAGNOSIS — D0422 Carcinoma in situ of skin of left ear and external auricular canal: Secondary | ICD-10-CM | POA: Diagnosis not present

## 2022-03-25 DIAGNOSIS — C44712 Basal cell carcinoma of skin of right lower limb, including hip: Secondary | ICD-10-CM | POA: Diagnosis not present

## 2022-03-25 DIAGNOSIS — Z85828 Personal history of other malignant neoplasm of skin: Secondary | ICD-10-CM | POA: Diagnosis not present

## 2022-03-25 DIAGNOSIS — D225 Melanocytic nevi of trunk: Secondary | ICD-10-CM | POA: Diagnosis not present

## 2022-03-25 DIAGNOSIS — D2262 Melanocytic nevi of left upper limb, including shoulder: Secondary | ICD-10-CM | POA: Diagnosis not present

## 2022-04-08 ENCOUNTER — Other Ambulatory Visit: Payer: Self-pay

## 2022-04-08 ENCOUNTER — Telehealth: Payer: Self-pay

## 2022-04-08 DIAGNOSIS — Z86010 Personal history of colonic polyps: Secondary | ICD-10-CM

## 2022-04-08 MED ORDER — NA SULFATE-K SULFATE-MG SULF 17.5-3.13-1.6 GM/177ML PO SOLN: 1.0000 | Freq: Once | ORAL | 0 refills | Status: AC

## 2022-04-08 NOTE — Telephone Encounter (Signed)
Gastroenterology Pre-Procedure Review  Request Date: 09/13/22 Requesting Physician: Dr. Allen Norris  PATIENT REVIEW QUESTIONS: The patient responded to the following health history questions as indicated:    1. Are you having any GI issues? no 2. Do you have a personal history of Polyps? yes (last colonoscopy performed by Dr. Allen Norris on 08/22/17) 3. Do you have a family history of Colon Cancer or Polyps? no 4. Diabetes Mellitus? no 5. Joint replacements in the past 12 months?no 6. Major health problems in the past 3 months?no 7. Any artificial heart valves, MVP, or defibrillator?no    MEDICATIONS & ALLERGIES:    Patient reports the following regarding taking any anticoagulation/antiplatelet therapy:   Plavix, Coumadin, Eliquis, Xarelto, Lovenox, Pradaxa, Brilinta, or Effient? no Aspirin? no  Patient confirms/reports the following medications:  Current Outpatient Medications  Medication Sig Dispense Refill   amLODipine (NORVASC) 2.5 MG tablet Take 1 tablet (2.5 mg total) by mouth daily. 90 tablet 1   amLODipine (NORVASC) 2.5 MG tablet Take 1 tablet (2.5 mg total) by mouth daily. 90 tablet 1   Apple Cider Vinegar 188 MG CAPS Take by mouth.     Ascorbic Acid (VITAMIN C) 100 MG tablet Take by mouth.     B Complex Vitamins (VITAMIN B-COMPLEX) TABS Take by mouth.     Bee Pollen 1000 MG TABS Take by mouth.     Bilberry 100 MG CAPS Take by mouth.     Calcium Citrate 1040 MG TABS Take 2 tablets by mouth daily.     Cinnamon 500 MG capsule Take by mouth.     clotrimazole-betamethasone (LOTRISONE) cream Apply 1 Application topically daily. 30 g 2   Coenzyme Q10 (COQ-10) 100 MG CAPS Take by mouth.     Cranberry 200 MG CAPS Take by mouth.     cyclobenzaprine (FLEXERIL) 10 MG tablet Take 1 tablet (10 mg total) by mouth at bedtime as needed for muscle spasms. 90 tablet 1   diclofenac Sodium (VOLTAREN) 1 % GEL Apply 2 g topically 4 (four) times daily. (Patient not taking: Reported on 12/24/2021) 50 g 1    Echinacea 500 MG CAPS Take by mouth.     Flaxseed, Linseed, (FLAXSEED OIL PO) Take by mouth.     Garlic 123XX123 MG CAPS Take by mouth.     Glucosamine-Chondroitin (GLUCOSAMINE CHONDR COMPLEX PO) Take by mouth.     lisinopril (ZESTRIL) 20 MG tablet TAKE ONE TABLET BY MOUTH ONE TIME DAILY 90 tablet 1   lisinopril (ZESTRIL) 20 MG tablet Take 1 tablet (20 mg total) by mouth daily. 90 tablet 1   meloxicam (MOBIC) 15 MG tablet Take 1 tablet (15 mg total) by mouth daily. 90 tablet 1   Multiple Vitamins-Minerals (MULTIVITAMIN WITH MINERALS) tablet Take 1 tablet by mouth daily.     propranolol ER (INDERAL LA) 80 MG 24 hr capsule Take 1 capsule (80 mg total) by mouth daily. 90 capsule 1   Shark Cartilage 500 MG CAPS Take by mouth.     tadalafil (CIALIS) 20 MG tablet Take one tablet PRN for erectile dysfunction     UNABLE TO FIND [employer] toxifier/regenerator herb     Vitamin D, Ergocalciferol, (DRISDOL) 1.25 MG (50000 UNIT) CAPS capsule Take 1 capsule (50,000 Units total) by mouth every 7 (seven) days. Take for 8 total doses(weeks) (Patient not taking: Reported on 12/24/2021) 8 capsule 0   No current facility-administered medications for this visit.    Patient confirms/reports the following allergies:  Allergies  Allergen Reactions  Cephalexin     No orders of the defined types were placed in this encounter.   AUTHORIZATION INFORMATION Primary Insurance: 1D#: Group #:  Secondary Insurance: 1D#: Group #:  SCHEDULE INFORMATION: Date: 09/13/22 Time: Location: [place]

## 2022-04-08 NOTE — Addendum Note (Signed)
Addended by: Vanetta Mulders on: 04/08/2022 04:41 PM   Modules accepted: Orders

## 2022-04-09 ENCOUNTER — Ambulatory Visit (INDEPENDENT_AMBULATORY_CARE_PROVIDER_SITE_OTHER): Payer: Medicare Other | Admitting: Family Medicine

## 2022-04-09 ENCOUNTER — Encounter: Payer: Self-pay | Admitting: Family Medicine

## 2022-04-09 VITALS — BP 128/78 | HR 64 | Ht 69.0 in | Wt 174.0 lb

## 2022-04-09 DIAGNOSIS — B379 Candidiasis, unspecified: Secondary | ICD-10-CM | POA: Diagnosis not present

## 2022-04-09 DIAGNOSIS — A499 Bacterial infection, unspecified: Secondary | ICD-10-CM

## 2022-04-09 MED ORDER — MUPIROCIN 2 % EX OINT: 1.0000 | TOPICAL_OINTMENT | Freq: Two times a day (BID) | CUTANEOUS | 0 refills | Status: AC

## 2022-04-09 MED ORDER — DOXYCYCLINE HYCLATE 100 MG PO TABS: 100.0000 mg | ORAL_TABLET | Freq: Two times a day (BID) | ORAL | 0 refills | Status: DC

## 2022-04-09 NOTE — Progress Notes (Signed)
Date:  04/09/2022   Name:  Marcus Gonzalez   DOB:  1950/10/21   MRN:  [id number]   Chief Complaint: Rash (In crease of R) groin- using clobetasol cream x 2 days- not helping much)  Rash This is a recurrent problem. The current episode started in the past 7 days. The problem has been gradually improving (lotrisone) since onset. The rash is characterized by itchiness and redness (fissure).    Lab Results  Component Value Date   NA 141 12/24/2021   K 4.8 12/24/2021   CO2 24 12/24/2021   GLUCOSE 96 12/24/2021   BUN 19 12/24/2021   CREATININE 1.04 12/24/2021   CALCIUM 10.0 12/24/2021   EGFR 76 12/24/2021   GFRNONAA 74 12/16/2019   Lab Results  Component Value Date   CHOL 162 06/20/2021   HDL 68 06/20/2021   LDLCALC 78 06/20/2021   TRIG 85 06/20/2021   CHOLHDL 2.3 11/29/2016   No results found for: "TSH" No results found for: "HGBA1C" No results found for: "WBC", "HGB", "HCT", "MCV", "PLT" Lab Results  Component Value Date   ALT 22 06/20/2021   AST 24 06/20/2021   ALKPHOS 66 06/20/2021   BILITOT 0.6 06/20/2021   No results found for: "25OHVITD2", "25OHVITD3", "VD25OH"   Review of Systems  Skin:  Positive for rash.    Patient Active Problem List   Diagnosis Date Noted   Closed fracture of left distal fibula 09/10/2021   Abdominal aortic atherosclerosis ([place]) 07/07/2020   Arthralgia of hip 06/03/2018   Arthritis of foot 06/03/2018   Encounter for screening colonoscopy    Benign neoplasm of ascending colon    Polyp of sigmoid colon    Rectal polyp    Primary localized osteoarthrosis, hand 05/05/2017   Hyperlipidemia 11/29/2016   Degeneration, intervertebral disc, cervical 04/19/2016   Migraine without status migrainosus, not intractable 04/19/2016   Essential hypertension 04/19/2016   Cephalalgia 05/24/2014   Routine general medical examination at a health care facility 05/24/2014   Anxiety attack 05/24/2014   Arthritis 05/24/2014   Benign prostatic  hyperplasia without lower urinary tract symptoms 05/24/2014   Narrowing of intervertebral disc space 05/24/2014   History of migraine headaches 05/24/2014   Personal history of other malignant neoplasm of skin 02/28/2012    Allergies  Allergen Reactions   Cephalexin     Past Surgical History:  Procedure Laterality Date   BASAL CELL CARCINOMA EXCISION     arm and leg   COLONOSCOPY  2013   cleared for 5 yrs- Dr Allen Norris   COLONOSCOPY WITH PROPOFOL N/A 08/22/2017   Procedure: COLONOSCOPY WITH PROPOFOL;  Surgeon: Lucilla Lame, MD;  Location: [facility];  Service: Endoscopy;  Laterality: N/A;   POLYPECTOMY  08/22/2017   Procedure: POLYPECTOMY INTESTINAL;  Surgeon: Lucilla Lame, MD;  Location: [facility];  Service: Endoscopy;;   skin ca removed      Social History   Tobacco Use   Smoking status: Former    Packs/day: 1.00    Years: 29.00    Total pack years: 29.00    Types: Cigarettes    Quit date: 1999    Years since quitting: 25.2   Smokeless tobacco: Never   Tobacco comments:    smoking cessation materials not required  Vaping Use   Vaping Use: Never used  Substance Use Topics   Alcohol use: Yes    Alcohol/week: 14.0 standard drinks of alcohol    Types: 14 Glasses of wine per week  Drug use: No     Medication list has been reviewed and updated.  Current Meds  Medication Sig   amLODipine (NORVASC) 2.5 MG tablet Take 1 tablet (2.5 mg total) by mouth daily.   amLODipine (NORVASC) 2.5 MG tablet Take 1 tablet (2.5 mg total) by mouth daily.   Apple Cider Vinegar 188 MG CAPS Take by mouth.   Ascorbic Acid (VITAMIN C) 100 MG tablet Take by mouth.   B Complex Vitamins (VITAMIN B-COMPLEX) TABS Take by mouth.   Bee Pollen 1000 MG TABS Take by mouth.   Bilberry 100 MG CAPS Take by mouth.   Calcium Citrate 1040 MG TABS Take 2 tablets by mouth daily.   Cinnamon 500 MG capsule Take by mouth.   clotrimazole-betamethasone (LOTRISONE) cream Apply 1 Application  topically daily.   Coenzyme Q10 (COQ-10) 100 MG CAPS Take by mouth.   Cranberry 200 MG CAPS Take by mouth.   cyclobenzaprine (FLEXERIL) 10 MG tablet Take 1 tablet (10 mg total) by mouth at bedtime as needed for muscle spasms.   Echinacea 500 MG CAPS Take by mouth.   Flaxseed, Linseed, (FLAXSEED OIL PO) Take by mouth.   Garlic 123XX123 MG CAPS Take by mouth.   Glucosamine-Chondroitin (GLUCOSAMINE CHONDR COMPLEX PO) Take by mouth.   lisinopril (ZESTRIL) 20 MG tablet TAKE ONE TABLET BY MOUTH ONE TIME DAILY   lisinopril (ZESTRIL) 20 MG tablet Take 1 tablet (20 mg total) by mouth daily.   meloxicam (MOBIC) 15 MG tablet Take 1 tablet (15 mg total) by mouth daily.   Multiple Vitamins-Minerals (MULTIVITAMIN WITH MINERALS) tablet Take 1 tablet by mouth daily.   propranolol ER (INDERAL LA) 80 MG 24 hr capsule Take 1 capsule (80 mg total) by mouth daily.   Shark Cartilage 500 MG CAPS Take by mouth.   tadalafil (CIALIS) 20 MG tablet Take one tablet PRN for erectile dysfunction   UNABLE TO FIND [employer] toxifier/regenerator herb   Vitamin D, Ergocalciferol, (DRISDOL) 1.25 MG (50000 UNIT) CAPS capsule Take 1 capsule (50,000 Units total) by mouth every 7 (seven) days. Take for 8 total doses(weeks)       04/09/2022   11:21 AM 12/24/2021   10:00 AM 09/10/2021    8:26 AM 09/06/2021    8:01 AM  GAD 7 : Generalized Anxiety Score  Nervous, Anxious, on Edge 0 0 1 0  Control/stop worrying 0 0 0 0  Worry too much - different things 0 0 0 0  Trouble relaxing 0 0 0 0  Restless 0 0 0 0  Easily annoyed or irritable 0 0 1 0  Afraid - awful might happen 0 0 0 0  Total GAD 7 Score 0 0 2 0  Anxiety Difficulty Not difficult at all Not difficult at all Not difficult at all Not difficult at all       04/09/2022   11:21 AM 12/24/2021    9:59 AM 09/10/2021    8:26 AM  Depression screen PHQ 2/9  Decreased Interest 0 0 0  Down, Depressed, Hopeless 0 0 0  PHQ - 2 Score 0 0 0  Altered sleeping 0 0 0  Tired, decreased  energy 0 0 0  Change in appetite 0 0 0  Feeling bad or failure about yourself  0 0 0  Trouble concentrating 0 0 0  Moving slowly or fidgety/restless 0 0 0  Suicidal thoughts 0 0 0  PHQ-9 Score 0 0 0  Difficult doing work/chores Not difficult at all Not difficult at  all Not difficult at all    BP Readings from Last 3 Encounters:  04/09/22 128/78  12/24/21 136/88  11/30/21 120/82    Physical Exam  Wt Readings from Last 3 Encounters:  04/09/22 174 lb (78.9 kg)  12/24/21 170 lb (77.1 kg)  10/18/21 170 lb (77.1 kg)    BP 128/78   Pulse 64   Ht '5\' 9"'$  (1.753 m)   Wt 174 lb (78.9 kg)   SpO2 97%   BMI 25.70 kg/m   Assessment and Plan: 1. Candidiasis New onset.  Recurrent.  Patient has used Lotrisone in the past but this has not cleared it up as quick and I think that there is a secondary bacterial infection that is in place as well patient has been encouraged to continue his Lotrisone and we have made other directional changes to use in addition.  2. Bacterial infection As noted above there is an underlying candidiasis but we have added Bactroban to the already combination Lotrisone.  We will also use doxycycline 100 mg twice a day to clear out the area of infection.  The erythema and fissuring is suggesting that there is likely a bacterial secondary infection. - mupirocin ointment (BACTROBAN) 2 %; Apply 1 Application topically 2 (two) times daily.  Dispense: 22 g; Refill: 0 - doxycycline (VIBRA-TABS) 100 MG tablet; Take 1 tablet (100 mg total) by mouth 2 (two) times daily.  Dispense: 20 tablet; Refill: 0     Otilio Miu, MD

## 2022-05-01 DIAGNOSIS — D0422 Carcinoma in situ of skin of left ear and external auricular canal: Secondary | ICD-10-CM | POA: Diagnosis not present

## 2022-05-01 DIAGNOSIS — L814 Other melanin hyperpigmentation: Secondary | ICD-10-CM | POA: Diagnosis not present

## 2022-05-01 DIAGNOSIS — Z85828 Personal history of other malignant neoplasm of skin: Secondary | ICD-10-CM | POA: Diagnosis not present

## 2022-05-01 DIAGNOSIS — L578 Other skin changes due to chronic exposure to nonionizing radiation: Secondary | ICD-10-CM | POA: Diagnosis not present

## 2022-05-22 ENCOUNTER — Telehealth: Payer: Self-pay

## 2022-05-22 NOTE — Telephone Encounter (Signed)
Voice message has been left for patient to call me back in regard to rescheduling his 09/13/22 Colonoscopy with Dr. Servando Snare at [facility].  Dr. Servando Snare will not be in on this day.  Thanks,  [place], [place]

## 2022-05-24 NOTE — Telephone Encounter (Signed)
Patient returned my phone call to r/s his 08/16 colonoscopy with Dr. Servando Snare.  Returned his phone call to see if 09/21/22 would be okay for his new procedure date.  LVM for pt to return my call.   Thanks, Marcelino Duster CMA

## 2022-05-28 DIAGNOSIS — C44712 Basal cell carcinoma of skin of right lower limb, including hip: Secondary | ICD-10-CM | POA: Diagnosis not present

## 2022-06-25 ENCOUNTER — Ambulatory Visit: Payer: Medicare Other | Admitting: Family Medicine

## 2022-06-27 ENCOUNTER — Ambulatory Visit: Payer: Medicare Other | Admitting: Family Medicine

## 2022-06-27 DIAGNOSIS — I10 Essential (primary) hypertension: Secondary | ICD-10-CM

## 2022-06-27 DIAGNOSIS — G43909 Migraine, unspecified, not intractable, without status migrainosus: Secondary | ICD-10-CM

## 2022-07-02 ENCOUNTER — Encounter: Payer: Self-pay | Admitting: Family Medicine

## 2022-07-02 ENCOUNTER — Ambulatory Visit (INDEPENDENT_AMBULATORY_CARE_PROVIDER_SITE_OTHER): Payer: Medicare Other | Admitting: Family Medicine

## 2022-07-02 VITALS — BP 120/78 | HR 72 | Ht 69.0 in | Wt 176.0 lb

## 2022-07-02 DIAGNOSIS — E785 Hyperlipidemia, unspecified: Secondary | ICD-10-CM | POA: Diagnosis not present

## 2022-07-02 DIAGNOSIS — G43909 Migraine, unspecified, not intractable, without status migrainosus: Secondary | ICD-10-CM

## 2022-07-02 DIAGNOSIS — B356 Tinea cruris: Secondary | ICD-10-CM | POA: Diagnosis not present

## 2022-07-02 DIAGNOSIS — I10 Essential (primary) hypertension: Secondary | ICD-10-CM | POA: Diagnosis not present

## 2022-07-02 DIAGNOSIS — M5126 Other intervertebral disc displacement, lumbar region: Secondary | ICD-10-CM | POA: Diagnosis not present

## 2022-07-02 MED ORDER — MELOXICAM 15 MG PO TABS: 15.0000 mg | ORAL_TABLET | Freq: Every day | ORAL | 1 refills | Status: DC

## 2022-07-02 MED ORDER — LISINOPRIL 20 MG PO TABS: 20.0000 mg | ORAL_TABLET | Freq: Every day | ORAL | 1 refills | Status: DC

## 2022-07-02 MED ORDER — PROPRANOLOL HCL ER 80 MG PO CP24: 80.0000 mg | ORAL_CAPSULE | Freq: Every day | ORAL | 1 refills | Status: DC

## 2022-07-02 MED ORDER — CLOTRIMAZOLE-BETAMETHASONE 1-0.05 % EX CREA: 1.0000 | TOPICAL_CREAM | Freq: Every day | CUTANEOUS | 2 refills | Status: DC

## 2022-07-02 MED ORDER — CYCLOBENZAPRINE HCL 10 MG PO TABS
10.0000 mg | ORAL_TABLET | Freq: Every evening | ORAL | 1 refills | Status: DC | PRN
Start: 2022-07-02 — End: 2022-12-19

## 2022-07-02 MED ORDER — AMLODIPINE BESYLATE 2.5 MG PO TABS: 2.5000 mg | ORAL_TABLET | Freq: Every day | ORAL | 1 refills | Status: DC

## 2022-07-02 NOTE — Progress Notes (Signed)
Date:  07/02/2022   Name:  Marcus Gonzalez   DOB:  October 01, 1950   MRN:  [id number]   Chief Complaint: Headache, Hypertension, and Arthritis  Headache  This is a chronic problem. The current episode started more than 1 year ago. The problem occurs intermittently. The problem has been waxing and waning. The quality of the pain is described as aching. The pain is mild. Pertinent negatives include no blurred vision, coughing or neck pain. His past medical history is significant for hypertension.  Hypertension This is a chronic problem. The current episode started more than 1 year ago. The problem has been waxing and waning since onset. The problem is controlled. Associated symptoms include headaches. Pertinent negatives include no blurred vision, chest pain, malaise/fatigue, neck pain, orthopnea, palpitations, peripheral edema, PND, shortness of breath or sweats. There are no associated agents to hypertension. Risk factors for coronary artery disease include dyslipidemia. Past treatments include calcium channel blockers, ACE inhibitors and beta blockers. The current treatment provides no improvement. There are no compliance problems.  There is no history of angina, kidney disease, CAD/MI, CVA, heart failure, left ventricular hypertrophy, PVD or retinopathy. There is no history of chronic renal disease, a hypertension causing med or renovascular disease.  Arthritis Presents for follow-up visit. He reports no pain, stiffness, joint swelling or joint warmth. The symptoms have been stable. Pertinent negatives include no diarrhea.    Lab Results  Component Value Date   NA 141 12/24/2021   K 4.8 12/24/2021   CO2 24 12/24/2021   GLUCOSE 96 12/24/2021   BUN 19 12/24/2021   CREATININE 1.04 12/24/2021   CALCIUM 10.0 12/24/2021   EGFR 76 12/24/2021   GFRNONAA 74 12/16/2019   Lab Results  Component Value Date   CHOL 162 06/20/2021   HDL 68 06/20/2021   LDLCALC 78 06/20/2021   TRIG 85 06/20/2021    CHOLHDL 2.3 11/29/2016   No results found for: "TSH" No results found for: "HGBA1C" No results found for: "WBC", "HGB", "HCT", "MCV", "PLT" Lab Results  Component Value Date   ALT 22 06/20/2021   AST 24 06/20/2021   ALKPHOS 66 06/20/2021   BILITOT 0.6 06/20/2021   No results found for: "25OHVITD2", "25OHVITD3", "VD25OH"   Review of Systems  Constitutional:  Negative for malaise/fatigue.  Eyes:  Negative for blurred vision and visual disturbance.  Respiratory:  Negative for cough, choking, chest tightness, shortness of breath and wheezing.   Cardiovascular:  Negative for chest pain, palpitations, orthopnea, leg swelling and PND.  Gastrointestinal:  Negative for abdominal distention, constipation and diarrhea.  Endocrine: Negative for polydipsia and polyuria.  Genitourinary:  Negative for difficulty urinating.  Musculoskeletal:  Positive for arthritis. Negative for joint swelling, neck pain and stiffness.  Neurological:  Positive for headaches.    Patient Active Problem List   Diagnosis Date Noted   Closed fracture of left distal fibula 09/10/2021   Abdominal aortic atherosclerosis (HCC) 07/07/2020   Arthralgia of hip 06/03/2018   Arthritis of foot 06/03/2018   Encounter for screening colonoscopy    Benign neoplasm of ascending colon    Polyp of sigmoid colon    Rectal polyp    Primary localized osteoarthrosis, hand 05/05/2017   Hyperlipidemia 11/29/2016   Degeneration, intervertebral disc, cervical 04/19/2016   Migraine without status migrainosus, not intractable 04/19/2016   Essential hypertension 04/19/2016   Cephalalgia 05/24/2014   Routine general medical examination at a health care facility 05/24/2014   Anxiety attack 05/24/2014   Arthritis  05/24/2014   Benign prostatic hyperplasia without lower urinary tract symptoms 05/24/2014   Narrowing of intervertebral disc space 05/24/2014   History of migraine headaches 05/24/2014   Personal history of other malignant  neoplasm of skin 02/28/2012    Allergies  Allergen Reactions   Cephalexin     Past Surgical History:  Procedure Laterality Date   BASAL CELL CARCINOMA EXCISION     arm and leg   COLONOSCOPY  2013   cleared for 5 yrs- Dr Servando Snare   COLONOSCOPY WITH PROPOFOL N/A 08/22/2017   Procedure: COLONOSCOPY WITH PROPOFOL;  Surgeon: [name], MD;  Location: [facility];  Service: Endoscopy;  Laterality: N/A;   POLYPECTOMY  08/22/2017   Procedure: POLYPECTOMY INTESTINAL;  Surgeon: [name], MD;  Location: [facility];  Service: Endoscopy;;   skin ca removed      Social History   Tobacco Use   Smoking status: Former    Packs/day: 1.00    Years: 29.00    Additional pack years: 0.00    Total pack years: 29.00    Types: Cigarettes    Quit date: 1999    Years since quitting: 25.4   Smokeless tobacco: Never   Tobacco comments:    smoking cessation materials not required  Vaping Use   Vaping Use: Never used  Substance Use Topics   Alcohol use: Yes    Alcohol/week: 14.0 standard drinks of alcohol    Types: 14 Glasses of wine per week   Drug use: No     Medication list has been reviewed and updated.  Current Meds  Medication Sig   amLODipine (NORVASC) 2.5 MG tablet Take 1 tablet (2.5 mg total) by mouth daily.   Apple Cider Vinegar 188 MG CAPS Take by mouth.   Ascorbic Acid (VITAMIN C) 100 MG tablet Take by mouth.   B Complex Vitamins (VITAMIN B-COMPLEX) TABS Take by mouth.   Bee Pollen 1000 MG TABS Take by mouth.   Bilberry 100 MG CAPS Take by mouth.   Calcium Citrate 1040 MG TABS Take 2 tablets by mouth daily.   Cinnamon 500 MG capsule Take by mouth.   clotrimazole-betamethasone (LOTRISONE) cream Apply 1 Application topically daily.   Coenzyme Q10 (COQ-10) 100 MG CAPS Take by mouth.   Cranberry 200 MG CAPS Take by mouth.   cyclobenzaprine (FLEXERIL) 10 MG tablet Take 1 tablet (10 mg total) by mouth at bedtime as needed for muscle spasms.   Echinacea 500 MG  CAPS Take by mouth.   Flaxseed, Linseed, (FLAXSEED OIL PO) Take by mouth.   Garlic 1000 MG CAPS Take by mouth.   Glucosamine-Chondroitin (GLUCOSAMINE CHONDR COMPLEX PO) Take by mouth.   lisinopril (ZESTRIL) 20 MG tablet Take 1 tablet (20 mg total) by mouth daily.   meloxicam (MOBIC) 15 MG tablet Take 1 tablet (15 mg total) by mouth daily.   Multiple Vitamins-Minerals (MULTIVITAMIN WITH MINERALS) tablet Take 1 tablet by mouth daily.   mupirocin ointment (BACTROBAN) 2 % Apply 1 Application topically 2 (two) times daily.   propranolol ER (INDERAL LA) 80 MG 24 hr capsule Take 1 capsule (80 mg total) by mouth daily.   Shark Cartilage 500 MG CAPS Take by mouth.   tadalafil (CIALIS) 20 MG tablet Take one tablet PRN for erectile dysfunction   UNABLE TO FIND [employer] toxifier/regenerator herb   Vitamin D, Ergocalciferol, (DRISDOL) 1.25 MG (50000 UNIT) CAPS capsule Take 1 capsule (50,000 Units total) by mouth every 7 (seven) days. Take for 8 total  doses(weeks)   [DISCONTINUED] amLODipine (NORVASC) 2.5 MG tablet Take 1 tablet (2.5 mg total) by mouth daily.   [DISCONTINUED] lisinopril (ZESTRIL) 20 MG tablet TAKE ONE TABLET BY MOUTH ONE TIME DAILY       07/02/2022    3:33 PM 04/09/2022   11:21 AM 12/24/2021   10:00 AM 09/10/2021    8:26 AM  GAD 7 : Generalized Anxiety Score  Nervous, Anxious, on Edge 0 0 0 1  Control/stop worrying 0 0 0 0  Worry too much - different things 0 0 0 0  Trouble relaxing 0 0 0 0  Restless 0 0 0 0  Easily annoyed or irritable 0 0 0 1  Afraid - awful might happen 0 0 0 0  Total GAD 7 Score 0 0 0 2  Anxiety Difficulty Not difficult at all Not difficult at all Not difficult at all Not difficult at all       07/02/2022    3:32 PM 04/09/2022   11:21 AM 12/24/2021    9:59 AM  Depression screen PHQ 2/9  Decreased Interest 0 0 0  Down, Depressed, Hopeless 0 0 0  PHQ - 2 Score 0 0 0  Altered sleeping 0 0 0  Tired, decreased energy 0 0 0  Change in appetite 0 0 0  Feeling  bad or failure about yourself  0 0 0  Trouble concentrating 0 0 0  Moving slowly or fidgety/restless 0 0 0  Suicidal thoughts 0 0 0  PHQ-9 Score 0 0 0  Difficult doing work/chores Not difficult at all Not difficult at all Not difficult at all    BP Readings from Last 3 Encounters:  07/02/22 120/78  04/09/22 128/78  12/24/21 136/88    Physical Exam Vitals and nursing note reviewed.  HENT:     Head: Normocephalic.  Eyes:     Extraocular Movements: Extraocular movements intact.  Cardiovascular:     Rate and Rhythm: Normal rate.     Heart sounds: No murmur heard.    No friction rub. No gallop.  Pulmonary:     Effort: No respiratory distress.     Breath sounds: No wheezing, rhonchi or rales.  Abdominal:     Palpations: Abdomen is soft.  Musculoskeletal:        General: Normal range of motion.     Cervical back: Normal range of motion and neck supple.     Wt Readings from Last 3 Encounters:  07/02/22 176 lb (79.8 kg)  04/09/22 174 lb (78.9 kg)  12/24/21 170 lb (77.1 kg)    BP 120/78   Pulse 72   Ht 5\' 9"  (1.753 m)   Wt 176 lb (79.8 kg)   SpO2 96%   BMI 25.99 kg/m   Assessment and Plan:  1. Essential hypertension Chronic.  Controlled.  Stable.  Asymptomatic.  Blood pressure today is 120/78.  Tolerating medications well.  Will continue amlodipine 2.5 mg daily, lisinopril 20 mg, propranolol ER 80 mg once a day, and will check comprehensive metabolic panel for electrolytes and GFR. - amLODipine (NORVASC) 2.5 MG tablet; Take 1 tablet (2.5 mg total) by mouth daily.  Dispense: 90 tablet; Refill: 1 - lisinopril (ZESTRIL) 20 MG tablet; Take 1 tablet (20 mg total) by mouth daily.  Dispense: 90 tablet; Refill: 1 - propranolol ER (INDERAL LA) 80 MG 24 hr capsule; Take 1 capsule (80 mg total) by mouth daily.  Dispense: 90 capsule; Refill: 1 - Comprehensive Metabolic Panel (CMET)  2. Lumbar  herniated disc Chronic.  Controlled.  Stable.  Herniated disks is controlled with  limitations with exacerbating circumstances.  Pain is controlled with episodic meloxicam 15 mg once a day and Flexeril 10 mg nightly. - meloxicam (MOBIC) 15 MG tablet; Take 1 tablet (15 mg total) by mouth daily.  Dispense: 90 tablet; Refill: 1 - cyclobenzaprine (FLEXERIL) 10 MG tablet; Take 1 tablet (10 mg total) by mouth at bedtime as needed for muscle spasms.  Dispense: 90 tablet; Refill: 1  3. Migraine without status migrainosus, not intractable, unspecified migraine type Chronic.  Controlled.  Stable.  Patient takes propranolol ER 80 mg once a day for prophylaxis and will continue with this controlled. - propranolol ER (INDERAL LA) 80 MG 24 hr capsule; Take 1 capsule (80 mg total) by mouth daily.  Dispense: 90 capsule; Refill: 1  4. Tinea inguinalis Episodic tinea noted in areas topically applied twice a day. - clotrimazole-betamethasone (LOTRISONE) cream; Apply 1 Application topically daily.  Dispense: 30 g; Refill: 2  5. Hyperlipidemia, unspecified hyperlipidemia type Previous history of mild elevation of cholesterol and will check lipid panel but patient will return tomorrow in a fasting state. - Lipid Panel With LDL/HDL Ratio    Elizabeth Sauer, MD

## 2022-07-04 DIAGNOSIS — E785 Hyperlipidemia, unspecified: Secondary | ICD-10-CM | POA: Diagnosis not present

## 2022-07-04 DIAGNOSIS — I10 Essential (primary) hypertension: Secondary | ICD-10-CM | POA: Diagnosis not present

## 2022-07-05 LAB — COMPREHENSIVE METABOLIC PANEL
ALT: 27 IU/L (ref 0–44)
AST: 28 IU/L (ref 0–40)
Albumin/Globulin Ratio: 1.6 (ref 1.2–2.2)
Albumin: 4.4 g/dL (ref 3.8–4.8)
Alkaline Phosphatase: 67 IU/L (ref 44–121)
BUN/Creatinine Ratio: 11 (ref 10–24)
BUN: 13 mg/dL (ref 8–27)
Bilirubin Total: 0.7 mg/dL (ref 0.0–1.2)
CO2: 29 mmol/L (ref 20–29)
Calcium: 9.8 mg/dL (ref 8.6–10.2)
Chloride: 102 mmol/L (ref 96–106)
Creatinine, Ser: 1.18 mg/dL (ref 0.76–1.27)
Globulin, Total: 2.8 g/dL (ref 1.5–4.5)
Glucose: 101 mg/dL — ABNORMAL HIGH (ref 70–99)
Potassium: 4 mmol/L (ref 3.5–5.2)
Sodium: 142 mmol/L (ref 134–144)
Total Protein: 7.2 g/dL (ref 6.0–8.5)
eGFR: 66 mL/min/{1.73_m2} (ref >59)

## 2022-07-05 LAB — LIPID PANEL WITH LDL/HDL RATIO
Cholesterol, Total: 167 mg/dL (ref 100–199)
HDL: 67 mg/dL (ref >39)
LDL Chol Calc (NIH): 79 mg/dL (ref 0–99)
LDL/HDL Ratio: 1.2 ratio (ref 0.0–3.6)
Triglycerides: 119 mg/dL (ref 0–149)
VLDL Cholesterol Cal: 21 mg/dL (ref 5–40)

## 2022-07-18 ENCOUNTER — Ambulatory Visit (INDEPENDENT_AMBULATORY_CARE_PROVIDER_SITE_OTHER): Payer: Medicare Other | Admitting: Family Medicine

## 2022-07-18 ENCOUNTER — Encounter: Payer: Self-pay | Admitting: Family Medicine

## 2022-07-18 ENCOUNTER — Ambulatory Visit
Admission: RE | Admit: 2022-07-18 | Discharge: 2022-07-18 | Disposition: A | Discharge status: Home / Self Care | Payer: Medicare Other | Attending: Family Medicine | Admitting: Family Medicine

## 2022-07-18 ENCOUNTER — Ambulatory Visit
Admission: RE | Admit: 2022-07-18 | Discharge: 2022-07-18 | Disposition: A | Discharge status: Home / Self Care | Payer: Medicare Other | Source: Ambulatory Visit | Attending: Family Medicine | Admitting: Family Medicine

## 2022-07-18 VITALS — BP 120/80 | HR 72 | Ht 69.0 in | Wt 176.0 lb

## 2022-07-18 DIAGNOSIS — S82832K Other fracture of upper and lower end of left fibula, subsequent encounter for closed fracture with nonunion: Secondary | ICD-10-CM | POA: Diagnosis not present

## 2022-07-18 DIAGNOSIS — S82832D Other fracture of upper and lower end of left fibula, subsequent encounter for closed fracture with routine healing: Secondary | ICD-10-CM

## 2022-07-18 DIAGNOSIS — S82832G Other fracture of upper and lower end of left fibula, subsequent encounter for closed fracture with delayed healing: Secondary | ICD-10-CM

## 2022-07-18 MED ORDER — VITAMIN D (ERGOCALCIFEROL) 1.25 MG (50000 UNIT) PO CAPS
50000.0000 [IU] | ORAL_CAPSULE | ORAL | 0 refills | Status: AC
Start: 2022-07-18 — End: ?

## 2022-07-18 NOTE — Assessment & Plan Note (Addendum)
Patient presents for follow-up evaluation of left lateral ankle pain in the setting of prior nondisplaced fracture of left distal fibula from injury sustained during 08/2021.  He states that symptoms had overall been doing well since our last visit during the 11/2021 injury timeframe where he had been demonstrating slow but steady interim radiographic progress.  He states that symptoms have been doing well until a recent "tweak "from ADLs and concern for ankle given upcoming trip abroad to [place].  Examination with full, planes range of motion, minimal tenderness with percussion at the prior fracture site, no crepitus.  Plan as follows: - Given the duration from injury onset and radiographic findings today, delayed healing/nonunion of concern - Will coordinate bone stimulator device - Rx course of vitamin D - Otherwise reassuring exam can be addressed with supportive care, activity modification do not elicit pain

## 2022-07-18 NOTE — Progress Notes (Addendum)
Primary Care / Sports Medicine Office Visit  Patient Information:  Patient ID: Marcus Gonzalez, male DOB: 25-Dec-1950 Age: 72 y.o. MRN: [id number]   Marcus Gonzalez is a pleasant 72 y.o. male presenting with the following:  Chief Complaint  Patient presents with   Ankle Pain    Vitals:   07/18/22 1424  BP: 120/80  Pulse: 72  SpO2: 99%   Vitals:   07/18/22 1424  Weight: 176 lb (79.8 kg)  Height: 5\' 9"  (1.753 m)   Body mass index is 25.99 kg/m.     Independent interpretation of notes and tests performed by another provider:     Independent interpretation demonstrates interim fracture remodeling, there is lucency still noted, given interim duration, nonunion concern, no acute osseous processes noted  Procedures performed:   None  Pertinent History, Exam, Impression, and Recommendations:   [name] was seen today for ankle pain.  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  He states that symptoms had overall been doing well since our last visit during the 11/2021 injury timeframe where he had been demonstrating slow but steady interim radiographic progress.  He states that symptoms have been doing well until a recent "tweak "from ADLs and concern for ankle given upcoming trip abroad to [place].  Examination with full, planes range of motion, minimal tenderness with percussion at the prior fracture site, no crepitus.  Plan as follows: - Given the duration from injury onset and radiographic findings today, delayed healing/nonunion of concern - Will coordinate bone stimulator device - Rx course of vitamin D - Otherwise reassuring exam can be addressed with supportive care, activity  modification do not elicit pain  Orders: -     Vitamin D (Ergocalciferol); Take 1 capsule (50,000 Units total) by mouth every 7 (seven) days. Take for 8 total doses(weeks)  Dispense: 8 capsule; Refill: 0  Closed fracture of distal end of left fibula with routine healing, unspecified fracture morphology, subsequent encounter Overview: Date of injury early August 2023  Assessment & Plan: Patient presents for follow-up evaluation of left lateral ankle pain in the setting of prior nondisplaced fracture of left distal fibula from injury sustained during 08/2021.  He states that symptoms had overall been doing well since our last visit during the 11/2021 injury timeframe where he had been demonstrating slow but steady interim radiographic progress.  He states that symptoms have been doing well until a recent "tweak "from ADLs and concern for ankle given upcoming trip abroad to [place].  Examination with full, planes range of motion, minimal tenderness with percussion at the prior fracture site, no crepitus.  Plan as follows: - Given the duration from injury onset and radiographic findings today, delayed healing/nonunion of concern - Will coordinate bone stimulator device - Rx course of vitamin D - Otherwise reassuring exam can be addressed with supportive care, activity modification do not elicit pain  Orders: -     DG Ankle Complete Left; Future     Orders & Medications Meds ordered this encounter  Medications   Vitamin D, Ergocalciferol, (DRISDOL) 1.25 MG (50000 UNIT) CAPS capsule    Sig: Take 1 capsule (50,000 Units total) by mouth every 7 (seven) days. Take for 8 total doses(weeks)  Dispense:  8 capsule    Refill:  0   Orders Placed This Encounter  Procedures   DG Ankle Complete Left     No follow-ups on file.     Marcus Banana, MD, [facility]   Primary Care Sports Medicine Primary Care and Sports Medicine at [facility]

## 2022-08-08 ENCOUNTER — Ambulatory Visit: Payer: Medicare Other | Admitting: Family Medicine

## 2022-08-15 ENCOUNTER — Other Ambulatory Visit: Payer: Self-pay | Admitting: Family Medicine

## 2022-08-15 DIAGNOSIS — S82832K Other fracture of upper and lower end of left fibula, subsequent encounter for closed fracture with nonunion: Secondary | ICD-10-CM

## 2022-08-16 NOTE — Telephone Encounter (Signed)
Requested medications are due for refill today.  no  Requested medications are on the active medications list.  yes  Last refill. 07/18/2022 #8 0 rf  Future visit scheduled.   yes  Notes to clinic.  Refill not delegated.    Requested Prescriptions  Pending Prescriptions Disp Refills   Vitamin D, Ergocalciferol, (DRISDOL) 1.25 MG (50000 UNIT) CAPS capsule [Pharmacy Med Name: VITAMIN D2 1.25 MG (50000 UNIT)] 8 capsule 0    Sig: TAKE ONE CAPSULE BY MOUTH EVERY WEEK FOR 8 WEEKS     Endocrinology:  Vitamins - Vitamin D Supplementation 2 Failed - 08/15/2022 11:16 AM      Failed - Manual Review: Route requests for 50,000 IU strength to the provider      Failed - Vitamin D in normal range and within 360 days    No results found for: "[id number]", "NW2956OZ3", "VD125OH2TOT", "25OHVITD3", "25OHVITD2", "25OHVITD1", "VD25OH"       Passed - Ca in normal range and within 360 days    Calcium  Date Value Ref Range Status  07/04/2022 9.8 8.6 - 10.2 mg/dL Final         Passed - Valid encounter within last 12 months    Recent Outpatient Visits           4 weeks ago 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, subsequent encounter   [place] [facility], Ocie Bob, MD   1 month ago Essential hypertension   [place] [facility] Inches, MD   4 months ago Candidiasis   [facility] at [facility]ine[facility] Inches, MD   7 months ago Lumbar herniated disc   [place] [facility] Inches, MD   8 months ago Closed fracture of distal end of left fibula with routine healing, unspecified fracture morphology, subsequent encounter   [facility] at [facility], Ocie Bob, MD       Future Appointments             In 4 months [name], MD [facility]  Primary Care & Sports Medicine at [facility]

## 2022-09-12 ENCOUNTER — Encounter: Payer: Self-pay | Admitting: Gastroenterology

## 2022-09-12 NOTE — Anesthesia Preprocedure Evaluation (Addendum)
Anesthesia Evaluation  Patient identified by MRN, date of birth, ID band Patient awake    Reviewed: Allergy & Precautions, H&P , NPO status , Patient's Chart, lab work & pertinent test results  Airway Mallampati: IV  TM Distance: >3 FB Neck ROM: Full    Dental no notable dental hx.    Pulmonary neg pulmonary ROS, former smoker   Pulmonary exam normal breath sounds clear to auscultation       Cardiovascular hypertension, negative cardio ROS Normal cardiovascular exam Rhythm:Regular Rate:Normal     Neuro/Psych  Headaches  Anxiety     negative neurological ROS  negative psych ROS   GI/Hepatic negative GI ROS, Neg liver ROS,,,  Endo/Other  negative endocrine ROS    Renal/GU negative Renal ROS  negative genitourinary   Musculoskeletal negative musculoskeletal ROS (+) Arthritis ,    Abdominal   Peds negative pediatric ROS (+)  Hematology negative hematology ROS (+)   Anesthesia Other Findings BPH (benign prostatic hyperplasia)  Hip pain Degenerative disc disease, cervical  Migraine Headache  Prostate cancer (HCC) Hypertension  BCC    Reproductive/Obstetrics negative OB ROS                             Anesthesia Physical Anesthesia Plan  ASA: 3  Anesthesia Plan: General   Post-op Pain Management:    Induction: Intravenous  PONV Risk Score and Plan:   Airway Management Planned: Natural Airway and Nasal Cannula  Additional Equipment:   Intra-op Plan:   Post-operative Plan:   Informed Consent: I have reviewed the patients History and Physical, chart, labs and discussed the procedure including the risks, benefits and alternatives for the proposed anesthesia with the patient or authorized representative who has indicated his/her understanding and acceptance.     Dental Advisory Given  Plan Discussed with: Anesthesiologist, CRNA and Surgeon  Anesthesia Plan Comments:  (Patient consented for risks of anesthesia including but not limited to:  - adverse reactions to medications - risk of airway placement if required - damage to eyes, teeth, lips or other oral mucosa - nerve damage due to positioning  - sore throat or hoarseness - Damage to heart, brain, nerves, lungs, other parts of body or loss of life  Patient voiced understanding.)       Anesthesia Quick Evaluation

## 2022-09-20 ENCOUNTER — Ambulatory Visit
Admission: RE | Admit: 2022-09-20 | Discharge: 2022-09-20 | Disposition: A | Discharge status: Home / Self Care | Payer: Medicare Other | Attending: Gastroenterology | Admitting: Gastroenterology

## 2022-09-20 ENCOUNTER — Ambulatory Visit: Payer: Medicare Other | Admitting: Anesthesiology

## 2022-09-20 ENCOUNTER — Encounter
Admission: RE | Disposition: A | Discharge status: Home / Self Care | Payer: Self-pay | Source: Home / Self Care | Attending: Gastroenterology

## 2022-09-20 ENCOUNTER — Other Ambulatory Visit: Payer: Self-pay

## 2022-09-20 ENCOUNTER — Encounter: Payer: Self-pay | Admitting: Gastroenterology

## 2022-09-20 DIAGNOSIS — Z87891 Personal history of nicotine dependence: Secondary | ICD-10-CM | POA: Insufficient documentation

## 2022-09-20 DIAGNOSIS — K5730 Diverticulosis of large intestine without perforation or abscess without bleeding: Secondary | ICD-10-CM | POA: Diagnosis not present

## 2022-09-20 DIAGNOSIS — Z85828 Personal history of other malignant neoplasm of skin: Secondary | ICD-10-CM | POA: Insufficient documentation

## 2022-09-20 DIAGNOSIS — Z1211 Encounter for screening for malignant neoplasm of colon: Secondary | ICD-10-CM | POA: Insufficient documentation

## 2022-09-20 DIAGNOSIS — Z8546 Personal history of malignant neoplasm of prostate: Secondary | ICD-10-CM | POA: Insufficient documentation

## 2022-09-20 DIAGNOSIS — I10 Essential (primary) hypertension: Secondary | ICD-10-CM | POA: Insufficient documentation

## 2022-09-20 DIAGNOSIS — D123 Benign neoplasm of transverse colon: Secondary | ICD-10-CM | POA: Insufficient documentation

## 2022-09-20 DIAGNOSIS — Z860100 Personal history of colon polyps, unspecified: Secondary | ICD-10-CM

## 2022-09-20 DIAGNOSIS — N400 Enlarged prostate without lower urinary tract symptoms: Secondary | ICD-10-CM | POA: Insufficient documentation

## 2022-09-20 DIAGNOSIS — K635 Polyp of colon: Secondary | ICD-10-CM

## 2022-09-20 DIAGNOSIS — K641 Second degree hemorrhoids: Secondary | ICD-10-CM | POA: Insufficient documentation

## 2022-09-20 DIAGNOSIS — K648 Other hemorrhoids: Secondary | ICD-10-CM

## 2022-09-20 HISTORY — DX: Essential (primary) hypertension: I10

## 2022-09-20 HISTORY — PX: POLYPECTOMY: SHX5525

## 2022-09-20 HISTORY — PX: COLONOSCOPY WITH PROPOFOL: SHX5780

## 2022-09-20 SURGERY — COLONOSCOPY WITH PROPOFOL
Anesthesia: General

## 2022-09-20 MED ORDER — LIDOCAINE HCL (CARDIAC) PF 100 MG/5ML IV SOSY
PREFILLED_SYRINGE | INTRAVENOUS | Status: DC | PRN
  Administered 2022-09-20: 40 mg via INTRATRACHEAL

## 2022-09-20 MED ORDER — PROPOFOL 10 MG/ML IV BOLUS
INTRAVENOUS | Status: DC | PRN
  Administered 2022-09-20 (×3): 20 mg via INTRAVENOUS
  Administered 2022-09-20: 40 mg via INTRAVENOUS
  Administered 2022-09-20 (×4): 20 mg via INTRAVENOUS
  Administered 2022-09-20: 40 mg via INTRAVENOUS

## 2022-09-20 MED ORDER — LACTATED RINGERS IV SOLN
INTRAVENOUS | Status: DC | PRN
Start: 2022-09-20 — End: 2022-09-20

## 2022-09-20 MED ORDER — STERILE WATER FOR IRRIGATION IR SOLN
Status: DC | PRN
  Administered 2022-09-20: 1

## 2022-09-20 SURGICAL SUPPLY — 8 items
GOWN CVR UNV OPN BCK APRN NK (MISCELLANEOUS) ×4 IMPLANT
GOWN ISOL THUMB LOOP REG UNIV (MISCELLANEOUS) ×4
KIT PRC NS LF DISP ENDO (KITS) ×2 IMPLANT
KIT PROCEDURE OLYMPUS (KITS) ×2
MANIFOLD NEPTUNE II (INSTRUMENTS) ×2 IMPLANT
SNARE COLD EXACTO (MISCELLANEOUS) IMPLANT
TRAP ETRAP POLY (MISCELLANEOUS) IMPLANT
WATER STERILE IRR 250ML POUR (IV SOLUTION) ×2 IMPLANT

## 2022-09-20 NOTE — Op Note (Signed)
[facility] Patient Name: Marcus Gonzalez Procedure Date: 09/20/2022 7:49 AM MRN: [id number] Account #: [id number] Date of Birth: 1950/04/25 Admit Type: Outpatient Age: 72 Room: [facility] OR ROOM 01 Gender: Male Note Status: Finalized Instrument Name: [id number] Procedure:             Colonoscopy Indications:           High risk colon cancer surveillance: Personal history                         of colonic polyps Providers:             [name] MD, MD Referring MD:          [name], MD (Referring MD) Medicines:             Propofol per Anesthesia Complications:         No immediate complications. Procedure:             Pre-Anesthesia Assessment:                        - Prior to the procedure, a History and Physical was                         performed, and patient medications and allergies were                         reviewed. The patient's tolerance of previous                         anesthesia was also reviewed. The risks and benefits                         of the procedure and the sedation options and risks                         were discussed with the patient. All questions were                         answered, and informed consent was obtained. Prior                         Anticoagulants: The patient has taken no anticoagulant                         or antiplatelet agents. ASA Grade Assessment: II - A                         patient with mild systemic disease. After reviewing                         the risks and benefits, the patient was deemed in                         satisfactory condition to undergo the procedure.                        After obtaining informed consent, the colonoscope was  passed under direct vision. Throughout the procedure,                         the patient's blood pressure, pulse, and oxygen                         saturations were monitored continuously. The                          Colonoscope was introduced through the anus and                         advanced to the the cecum, identified by appendiceal                         orifice and ileocecal valve. The colonoscopy was                         performed without difficulty. The patient tolerated                         the procedure well. The quality of the bowel                         preparation was excellent. Findings:      The perianal and digital rectal examinations were normal.      A 4 mm polyp was found in the transverse colon. The polyp was sessile.       The polyp was removed with a cold snare. Resection and retrieval were       complete.      A 3 mm polyp was found in the sigmoid colon. The polyp was sessile. The       polyp was removed with a cold snare. Resection and retrieval were       complete.      Many small-mouthed diverticula were found in the sigmoid colon and       ascending colon.      Non-bleeding internal hemorrhoids were found during retroflexion. The       hemorrhoids were Grade II (internal hemorrhoids that prolapse but reduce       spontaneously). Impression:            - One 4 mm polyp in the transverse colon, removed with                         a cold snare. Resected and retrieved.                        - One 3 mm polyp in the sigmoid colon, removed with a                         cold snare. Resected and retrieved.                        - Diverticulosis in the sigmoid colon and in the                         ascending colon.                        -  Non-bleeding internal hemorrhoids. Recommendation:        - Discharge patient to home.                        - Resume previous diet.                        - Continue present medications.                        - Await pathology results.                        - Repeat colonoscopy in 7 years for surveillance. Procedure Code(s):     --- Professional ---                        [phone], Colonoscopy, flexible; with removal of                          tumor(s), polyp(s), or other lesion(s) by snare                         technique Diagnosis Code(s):     --- Professional ---                        Z86.010, Personal history of colonic polyps                        D12.5, Benign neoplasm of sigmoid colon CPT copyright 2022 American Medical Association. All rights reserved. The codes documented in this report are preliminary and upon coder review may  be revised to meet current compliance requirements. [name] MD, MD 09/20/2022 8:17:10 AM This report has been signed electronically. Number of Addenda: 0 Note Initiated On: 09/20/2022 7:49 AM Scope Withdrawal Time: 0 hours 8 minutes 51 seconds  Total Procedure Duration: 0 hours 12 minutes 31 seconds  Estimated Blood Loss:  Estimated blood loss: none.      [facility]

## 2022-09-20 NOTE — Transfer of Care (Signed)
Immediate Anesthesia Transfer of Care Note  Patient: Marcus Gonzalez  Procedure(s) Performed: COLONOSCOPY WITH PROPOFOL  Patient Location: PACU  Anesthesia Type: General  Level of Consciousness: awake, alert  and patient cooperative  Airway and Oxygen Therapy: Patient Spontanous Breathing and Patient connected to supplemental oxygen  Post-op Assessment: Post-op Vital signs reviewed, Patient's Cardiovascular Status Stable, Respiratory Function Stable, Patent Airway and No signs of Nausea or vomiting  Post-op Vital Signs: Reviewed and stable  Complications: No notable events documented.

## 2022-09-20 NOTE — H&P (Signed)
Marcus Minium, MD [facility] [address], [address] [place], [place] [address] [phone] Fax : [phone]  Primary Care Physician:  [name], MD Primary Gastroenterologist:  Dr. Servando Snare  Pre-Procedure History & Physical: HPI:  Marcus Gonzalez is a 72 y.o. male is here for an colonoscopy.   Past Medical History:  Diagnosis Date   BPH (benign prostatic hyperplasia)    Degenerative disc disease, cervical    Headache    Hip pain    Hypertension    Migraine    Prostate cancer [facility])     Past Surgical History:  Procedure Laterality Date   BASAL CELL CARCINOMA EXCISION     arm and leg   COLONOSCOPY  2013   cleared for 5 yrs- Dr Servando Snare   COLONOSCOPY WITH PROPOFOL N/A 08/22/2017   Procedure: COLONOSCOPY WITH PROPOFOL;  Surgeon: Marcus Minium, MD;  Location: [facility];  Service: Endoscopy;  Laterality: N/A;   POLYPECTOMY  08/22/2017   Procedure: POLYPECTOMY INTESTINAL;  Surgeon: Marcus Minium, MD;  Location: [facility];  Service: Endoscopy;;   skin ca removed      Prior to Admission medications   Medication Sig Start Date End Date Taking? Authorizing Provider  amLODipine (NORVASC) 2.5 MG tablet Take 1 tablet (2.5 mg total) by mouth daily. 12/24/21  Yes [name], MD  amLODipine (NORVASC) 2.5 MG tablet Take 1 tablet (2.5 mg total) by mouth daily. 07/02/22  Yes [name], MD  Apple Cider Vinegar 188 MG CAPS Take by mouth.   Yes [provider]  Ascorbic Acid (VITAMIN C) 100 MG tablet Take by mouth.   Yes [provider]  B Complex Vitamins (VITAMIN B-COMPLEX) TABS Take by mouth.   Yes [provider]  Bee Pollen 1000 MG TABS Take by mouth.   Yes [provider]  Bilberry 100 MG CAPS Take by mouth.   Yes [provider]  Calcium Citrate 1040 MG TABS Take 2 tablets by mouth daily.   Yes [provider]  Cinnamon 500 MG capsule Take by mouth.   Yes [provider]  clotrimazole-betamethasone  (LOTRISONE) cream Apply 1 Application topically daily. 07/02/22  Yes [name], MD  Coenzyme Q10 (COQ-10) 100 MG CAPS Take by mouth.   Yes [provider]  Cranberry 200 MG CAPS Take by mouth.   Yes [provider]  cyclobenzaprine (FLEXERIL) 10 MG tablet Take 1 tablet (10 mg total) by mouth at bedtime as needed for muscle spasms. 07/02/22  Yes [name], MD  Echinacea 500 MG CAPS Take by mouth.   Yes [provider]  famotidine (PEPCID) 20 MG tablet Take 20 mg by mouth 2 (two) times daily.   Yes [provider]  Flaxseed, Linseed, (FLAXSEED OIL PO) Take by mouth.   Yes [provider]  Garlic 1000 MG CAPS Take by mouth.   Yes [provider]  Glucosamine-Chondroitin (GLUCOSAMINE CHONDR COMPLEX PO) Take by mouth.   Yes [provider]  lisinopril (ZESTRIL) 20 MG tablet Take 1 tablet (20 mg total) by mouth daily. 07/02/22  Yes [name], MD  meloxicam (MOBIC) 15 MG tablet Take 1 tablet (15 mg total) by mouth daily. 07/02/22  Yes [name], MD  Multiple Vitamins-Minerals (MULTIVITAMIN WITH MINERALS) tablet Take 1 tablet by mouth daily.   Yes [provider]  propranolol ER (INDERAL LA) 80 MG 24 hr capsule Take 1 capsule (80 mg total) by mouth daily. 07/02/22  Yes [name], MD  Shark Cartilage 500 MG CAPS Take by mouth.   Yes [provider]  tadalafil (CIALIS) 20 MG tablet Take one tablet PRN for erectile dysfunction 08/11/20  Yes [provider]  UNABLE TO FIND [employer] toxifier/regenerator herb   Yes [provider]  diclofenac Sodium (VOLTAREN) 1 % GEL Apply 2 g topically 4 (four) times daily. Patient not taking: Reported on 12/24/2021 09/06/21   [name], MD  mupirocin ointment (BACTROBAN) 2 % Apply 1 Application topically 2 (two) times daily. Patient not taking: Reported on 09/12/2022 04/09/22   [name], MD  Vitamin D, Ergocalciferol, (DRISDOL) 1.25 MG (50000 UNIT)  CAPS capsule Take 1 capsule (50,000 Units total) by mouth every 7 (seven) days. Take for 8 total doses(weeks) Patient not taking: Reported on 09/12/2022 07/18/22   [name], MD    Allergies as of 04/08/2022 - Review Complete 04/08/2022  Allergen Reaction Noted   Cephalexin  05/24/2014    Family History  Problem Relation Age of Onset   Heart disease Father    Diabetes Maternal Aunt    Cancer Maternal Grandmother        colon cancer   Heart disease Mother    Dementia Mother    Other Brother        "bladder issues"    Social History   Socioeconomic History   Marital status: Single    Spouse name: Not on file   Number of children: 0   Years of education: Not on file   Highest education level: Doctorate  Occupational History    Employer: [employer]   Tobacco Use   Smoking status: Former    Current packs/day: 0.00    Average packs/day: 1 pack/day for 29.0 years (29.0 ttl pk-yrs)    Types: Cigarettes    Start date: 46    Quit date: 1999    Years since quitting: 25.6   Smokeless tobacco: Never   Tobacco comments:    smoking cessation materials not required  Vaping Use   Vaping status: Never Used  Substance and Sexual Activity   Alcohol use: Yes    Alcohol/week: 14.0 standard drinks of alcohol    Types: 14 Glasses of wine per week   Drug use: No   Sexual activity: Not Currently  Other Topics Concern   Not on file  Social History Narrative   ** Merged History Encounter **       Social Determinants of Health   Financial Resource Strain: Low Risk  (07/16/2021)   Overall Financial Resource Strain (CARDIA)    Difficulty of Paying Living Expenses: Not hard at all  Food Insecurity: No Food Insecurity (07/16/2021)   Hunger Vital Sign    Worried About Running Out of Food in the Last Year: Never true    Ran Out of Food in the Last Year: Never true  Transportation Needs: No Transportation Needs (07/16/2021)   PRAPARE - Administrator, Civil Service  (Medical): No    Lack of Transportation (Non-Medical): No  Physical Activity: Sufficiently Active (07/16/2021)   Exercise Vital Sign    Days of Exercise per Week: 3 days    Minutes of Exercise per Session: 60 min  Stress: No Stress Concern Present (07/16/2021)   [employer] of Occupational Health - Occupational Stress Questionnaire    Feeling of Stress : Not at all  Social Connections: Moderately Isolated (07/16/2021)   Social Connection and Isolation Panel [NHANES]  Frequency of Communication with Friends and Family: More than three times a week    Frequency of Social Gatherings with Friends and Family: More than three times a week    Attends Religious Services: More than 4 times per year    Active Member of [employer] or Organizations: No    Attends Banker Meetings: Never    Marital Status: Never married  Intimate Partner Violence: Not At Risk (07/16/2021)   Humiliation, Afraid, Rape, and Kick questionnaire    Fear of Current or Ex-Partner: No    Emotionally Abused: No    Physically Abused: No    Sexually Abused: No    Review of Systems: See HPI, otherwise negative ROS  Physical Exam: Ht 5\' 9"  (1.753 m)   Wt 76.2 kg   BMI 24.81 kg/m  General:   Alert,  pleasant and cooperative in NAD Head:  Normocephalic and atraumatic. Neck:  Supple; no masses or thyromegaly. Lungs:  Clear throughout to auscultation.    Heart:  Regular rate and rhythm. Abdomen:  Soft, nontender and nondistended. Normal bowel sounds, without guarding, and without rebound.   Neurologic:  Alert and  oriented x4;  grossly normal neurologically.  Impression/Plan: Marcus Gonzalez is here for an colonoscopy to be performed for a history of adenomatous polyps on 2019   Risks, benefits, limitations, and alternatives regarding  colonoscopy have been reviewed with the patient.  Questions have been answered.  All parties agreeable.   Marcus Minium, MD  09/20/2022, 7:18 AM

## 2022-09-20 NOTE — Anesthesia Postprocedure Evaluation (Signed)
Anesthesia Post Note  Patient: Marcus Gonzalez  Procedure(s) Performed: COLONOSCOPY WITH PROPOFOL POLYPECTOMY  Patient location during evaluation: PACU Anesthesia Type: General Level of consciousness: awake and alert Pain management: pain level controlled Vital Signs Assessment: post-procedure vital signs reviewed and stable Respiratory status: spontaneous breathing, nonlabored ventilation, respiratory function stable and patient connected to nasal cannula oxygen Cardiovascular status: blood pressure returned to baseline and stable Postop Assessment: no apparent nausea or vomiting Anesthetic complications: no   No notable events documented.   Last Vitals:  Vitals:   09/20/22 0818 09/20/22 0830  BP: 100/71 138/81  Pulse: 65 61  Resp: 20 17  Temp: (!) 36.4 C (!) 36.3 C  SpO2: 95% 100%    Last Pain:  Vitals:   09/20/22 0830  TempSrc:   PainSc: 0-No pain                 [place] [facility] Coreon Simkins

## 2022-09-23 DIAGNOSIS — C61 Malignant neoplasm of prostate: Secondary | ICD-10-CM | POA: Diagnosis not present

## 2022-09-24 DIAGNOSIS — Z961 Presence of intraocular lens: Secondary | ICD-10-CM | POA: Diagnosis not present

## 2022-09-25 ENCOUNTER — Encounter: Payer: Self-pay | Admitting: Gastroenterology

## 2022-09-29 DIAGNOSIS — Z23 Encounter for immunization: Secondary | ICD-10-CM | POA: Diagnosis not present

## 2022-10-10 DIAGNOSIS — L570 Actinic keratosis: Secondary | ICD-10-CM | POA: Diagnosis not present

## 2022-10-10 DIAGNOSIS — L0889 Other specified local infections of the skin and subcutaneous tissue: Secondary | ICD-10-CM | POA: Diagnosis not present

## 2022-10-10 DIAGNOSIS — L538 Other specified erythematous conditions: Secondary | ICD-10-CM | POA: Diagnosis not present

## 2022-10-10 DIAGNOSIS — D2262 Melanocytic nevi of left upper limb, including shoulder: Secondary | ICD-10-CM | POA: Diagnosis not present

## 2022-10-10 DIAGNOSIS — B078 Other viral warts: Secondary | ICD-10-CM | POA: Diagnosis not present

## 2022-10-10 DIAGNOSIS — D485 Neoplasm of uncertain behavior of skin: Secondary | ICD-10-CM | POA: Diagnosis not present

## 2022-10-10 DIAGNOSIS — C44619 Basal cell carcinoma of skin of left upper limb, including shoulder: Secondary | ICD-10-CM | POA: Diagnosis not present

## 2022-10-10 DIAGNOSIS — D2261 Melanocytic nevi of right upper limb, including shoulder: Secondary | ICD-10-CM | POA: Diagnosis not present

## 2022-10-10 DIAGNOSIS — Z85828 Personal history of other malignant neoplasm of skin: Secondary | ICD-10-CM | POA: Diagnosis not present

## 2022-10-10 DIAGNOSIS — D2272 Melanocytic nevi of left lower limb, including hip: Secondary | ICD-10-CM | POA: Diagnosis not present

## 2022-10-10 DIAGNOSIS — D225 Melanocytic nevi of trunk: Secondary | ICD-10-CM | POA: Diagnosis not present

## 2022-10-10 DIAGNOSIS — D2271 Melanocytic nevi of right lower limb, including hip: Secondary | ICD-10-CM | POA: Diagnosis not present

## 2022-11-12 DIAGNOSIS — C44619 Basal cell carcinoma of skin of left upper limb, including shoulder: Secondary | ICD-10-CM | POA: Diagnosis not present

## 2022-11-17 DIAGNOSIS — Z23 Encounter for immunization: Secondary | ICD-10-CM | POA: Diagnosis not present

## 2022-12-10 ENCOUNTER — Telehealth: Payer: Self-pay

## 2022-12-10 NOTE — Telephone Encounter (Signed)
Copied from CRM [id number]. Topic: Medicare AWV >> Dec 09, 2022  4:45 PM Everette C wrote: Reason for CRM: The patient would like to be contacted to reschedule their AWV   Please contact further when possible

## 2022-12-19 ENCOUNTER — Encounter: Payer: Self-pay | Admitting: Family Medicine

## 2022-12-19 ENCOUNTER — Ambulatory Visit (INDEPENDENT_AMBULATORY_CARE_PROVIDER_SITE_OTHER): Payer: Medicare Other | Admitting: Family Medicine

## 2022-12-19 VITALS — BP 144/94 | HR 68 | Ht 69.0 in | Wt 171.0 lb

## 2022-12-19 DIAGNOSIS — M5126 Other intervertebral disc displacement, lumbar region: Secondary | ICD-10-CM | POA: Diagnosis not present

## 2022-12-19 DIAGNOSIS — I10 Essential (primary) hypertension: Secondary | ICD-10-CM

## 2022-12-19 DIAGNOSIS — G43909 Migraine, unspecified, not intractable, without status migrainosus: Secondary | ICD-10-CM

## 2022-12-19 MED ORDER — CYCLOBENZAPRINE HCL 10 MG PO TABS: 10.0000 mg | ORAL_TABLET | Freq: Every evening | ORAL | 1 refills | Status: DC | PRN

## 2022-12-19 MED ORDER — CYCLOBENZAPRINE HCL 10 MG PO TABS
10.0000 mg | ORAL_TABLET | Freq: Every evening | ORAL | 1 refills | Status: AC | PRN
Start: 2022-12-19 — End: ?

## 2022-12-19 MED ORDER — MELOXICAM 15 MG PO TABS
15.0000 mg | ORAL_TABLET | Freq: Every day | ORAL | 1 refills | Status: DC
Start: 2022-12-19 — End: 2022-12-19

## 2022-12-19 MED ORDER — AMLODIPINE BESYLATE 2.5 MG PO TABS
2.5000 mg | ORAL_TABLET | Freq: Every day | ORAL | 1 refills | Status: DC
Start: 2022-12-19 — End: 2022-12-19

## 2022-12-19 MED ORDER — LISINOPRIL 20 MG PO TABS
20.0000 mg | ORAL_TABLET | Freq: Every day | ORAL | 1 refills | Status: AC
Start: 2022-12-19 — End: ?

## 2022-12-19 MED ORDER — LISINOPRIL 20 MG PO TABS
20.0000 mg | ORAL_TABLET | Freq: Every day | ORAL | 1 refills | Status: DC
Start: 2022-12-19 — End: 2022-12-19

## 2022-12-19 MED ORDER — PROPRANOLOL HCL ER 80 MG PO CP24
80.0000 mg | ORAL_CAPSULE | Freq: Every day | ORAL | 1 refills | Status: AC
Start: 2022-12-19 — End: ?

## 2022-12-19 MED ORDER — PROPRANOLOL HCL ER 80 MG PO CP24
80.0000 mg | ORAL_CAPSULE | Freq: Every day | ORAL | 1 refills | Status: DC
Start: 2022-12-19 — End: 2022-12-19

## 2022-12-19 MED ORDER — AMLODIPINE BESYLATE 2.5 MG PO TABS
2.5000 mg | ORAL_TABLET | Freq: Every day | ORAL | 1 refills | Status: AC
Start: 2022-12-19 — End: ?

## 2022-12-19 MED ORDER — MELOXICAM 15 MG PO TABS
15.0000 mg | ORAL_TABLET | Freq: Every day | ORAL | 1 refills | Status: AC
Start: 2022-12-19 — End: ?

## 2022-12-19 NOTE — Progress Notes (Signed)
Date:  12/19/2022   Name:  Marcus Gonzalez   DOB:  [birth year]   MRN:  [id number]   Chief Complaint: Hypertension  Hypertension This is a chronic problem. The current episode started more than 1 year ago. The problem has been gradually improving since onset. The problem is controlled. Associated symptoms include neck pain. Pertinent negatives include no anxiety, blurred vision, chest pain, headaches, malaise/fatigue, orthopnea, palpitations, peripheral edema, PND, shortness of breath or sweats. There are no associated agents to hypertension. There are no known risk factors for coronary artery disease.    Lab Results  Component Value Date   NA 142 07/04/2022   K 4.0 07/04/2022   CO2 29 07/04/2022   GLUCOSE 101 (H) 07/04/2022   BUN 13 07/04/2022   CREATININE 1.18 07/04/2022   CALCIUM 9.8 07/04/2022   EGFR 66 07/04/2022   GFRNONAA 74 12/16/2019   Lab Results  Component Value Date   CHOL 167 07/04/2022   HDL 67 07/04/2022   LDLCALC 79 07/04/2022   TRIG 119 07/04/2022   CHOLHDL 2.3 11/29/2016   No results found for: "TSH" No results found for: "HGBA1C" No results found for: "WBC", "HGB", "HCT", "MCV", "PLT" Lab Results  Component Value Date   ALT 27 07/04/2022   AST 28 07/04/2022   ALKPHOS 67 07/04/2022   BILITOT 0.7 07/04/2022   No results found for: "25OHVITD2", "25OHVITD3", "VD25OH"   Review of Systems  Constitutional:  Negative for diaphoresis, malaise/fatigue and unexpected weight change.  HENT:  Negative for congestion, trouble swallowing and voice change.   Eyes:  Negative for blurred vision.  Respiratory:  Negative for cough, choking, chest tightness, shortness of breath, wheezing and stridor.   Cardiovascular:  Negative for chest pain, palpitations, orthopnea, leg swelling and PND.  Gastrointestinal:  Negative for abdominal distention, abdominal pain, blood in stool, constipation, diarrhea, nausea and rectal pain.  Musculoskeletal:  Positive for back pain and  neck pain. Negative for arthralgias, gait problem, joint swelling, myalgias and neck stiffness.  Skin:  Negative for color change.  Neurological:  Negative for headaches.    Patient Active Problem List   Diagnosis Date Noted   History of colonic polyps 09/20/2022   Polyp of transverse colon 09/20/2022   Traumatic closed nondisplaced fracture of distal fibula with delayed healing, left 09/10/2021   Abdominal aortic atherosclerosis (HCC) 07/07/2020   Arthralgia of hip 06/03/2018   Arthritis of foot 06/03/2018   Encounter for screening colonoscopy    Benign neoplasm of ascending colon    Polyp of sigmoid colon    Rectal polyp    Primary localized osteoarthrosis, hand 05/05/2017   Hyperlipidemia 11/29/2016   Degeneration, intervertebral disc, cervical 04/19/2016   Migraine without status migrainosus, not intractable 04/19/2016   Essential hypertension 04/19/2016   Cephalalgia 05/24/2014   Routine general medical examination at a health care facility 05/24/2014   Anxiety attack 05/24/2014   Arthritis 05/24/2014   Benign prostatic hyperplasia without lower urinary tract symptoms 05/24/2014   Narrowing of intervertebral disc space 05/24/2014   History of migraine headaches 05/24/2014   Personal history of other malignant neoplasm of skin 02/28/2012    Allergies  Allergen Reactions   Cephalexin     Past Surgical History:  Procedure Laterality Date   BASAL CELL CARCINOMA EXCISION     arm and leg   COLONOSCOPY  2013   cleared for 5 yrs- Dr Servando Snare   COLONOSCOPY WITH PROPOFOL N/A 08/22/2017   Procedure: COLONOSCOPY WITH PROPOFOL;  Surgeon: [name], MD;  Location: [facility];  Service: Endoscopy;  Laterality: N/A;   COLONOSCOPY WITH PROPOFOL N/A 09/20/2022   Procedure: COLONOSCOPY WITH PROPOFOL;  Surgeon: [name], MD;  Location: [facility];  Service: Endoscopy;  Laterality: N/A;   POLYPECTOMY  08/22/2017   Procedure: POLYPECTOMY INTESTINAL;  Surgeon: [name], MD;  Location: [facility];  Service: Endoscopy;;   POLYPECTOMY  09/20/2022   Procedure: POLYPECTOMY;  Surgeon: [name], MD;  Location: [facility];  Service: Endoscopy;;   skin ca removed      Social History   Tobacco Use   Smoking status: Former    Current packs/day: 0.00    Average packs/day: 1 pack/day for 29.0 years (29.0 ttl pk-yrs)    Types: Cigarettes    Start date: 70    Quit date: 1999    Years since quitting: 25.9   Smokeless tobacco: Never   Tobacco comments:    smoking cessation materials not required  Vaping Use   Vaping status: Never Used  Substance Use Topics   Alcohol use: Yes    Alcohol/week: 14.0 standard drinks of alcohol    Types: 14 Glasses of wine per week   Drug use: No     Medication list has been reviewed and updated.  Current Meds  Medication Sig   amLODipine (NORVASC) 2.5 MG tablet Take 1 tablet (2.5 mg total) by mouth daily.   amLODipine (NORVASC) 2.5 MG tablet Take 1 tablet (2.5 mg total) by mouth daily.   Apple Cider Vinegar 188 MG CAPS Take by mouth.   Ascorbic Acid (VITAMIN C) 100 MG tablet Take by mouth.   B Complex Vitamins (VITAMIN B-COMPLEX) TABS Take by mouth.   Bee Pollen 1000 MG TABS Take by mouth.   Bilberry 100 MG CAPS Take by mouth.   Calcium Citrate 1040 MG TABS Take 2 tablets by mouth daily.   Cinnamon 500 MG capsule Take by mouth.   clotrimazole-betamethasone (LOTRISONE) cream Apply 1 Application topically daily.   Coenzyme Q10 (COQ-10) 100 MG CAPS Take by mouth.   Cranberry 200 MG CAPS Take by mouth.   cyclobenzaprine (FLEXERIL) 10 MG tablet Take 1 tablet (10 mg total) by mouth at bedtime as needed for muscle spasms.   diclofenac Sodium (VOLTAREN) 1 % GEL Apply 2 g topically 4 (four) times daily.   Echinacea 500 MG CAPS Take by mouth.   famotidine (PEPCID) 20 MG tablet Take 20 mg by mouth 2 (two) times daily.   Flaxseed, Linseed, (FLAXSEED OIL PO) Take by mouth.   Garlic 1000 MG CAPS Take by  mouth.   Glucosamine-Chondroitin (GLUCOSAMINE CHONDR COMPLEX PO) Take by mouth.   lisinopril (ZESTRIL) 20 MG tablet Take 1 tablet (20 mg total) by mouth daily.   meloxicam (MOBIC) 15 MG tablet Take 1 tablet (15 mg total) by mouth daily.   Multiple Vitamins-Minerals (MULTIVITAMIN WITH MINERALS) tablet Take 1 tablet by mouth daily.   mupirocin ointment (BACTROBAN) 2 % Apply 1 Application topically 2 (two) times daily.   propranolol ER (INDERAL LA) 80 MG 24 hr capsule Take 1 capsule (80 mg total) by mouth daily.   Shark Cartilage 500 MG CAPS Take by mouth.   tadalafil (CIALIS) 20 MG tablet Take one tablet PRN for erectile dysfunction   UNABLE TO FIND [employer] toxifier/regenerator herb   Vitamin D, Ergocalciferol, (DRISDOL) 1.25 MG (50000 UNIT) CAPS capsule Take 1 capsule (50,000 Units total) by mouth every 7 (seven) days. Take for 8  total doses(weeks)       12/19/2022    9:44 AM 07/02/2022    3:33 PM 04/09/2022   11:21 AM 12/24/2021   10:00 AM  GAD 7 : Generalized Anxiety Score  Nervous, Anxious, on Edge 0 0 0 0  Control/stop worrying 0 0 0 0  Worry too much - different things 0 0 0 0  Trouble relaxing 0 0 0 0  Restless 0 0 0 0  Easily annoyed or irritable 1 0 0 0  Afraid - awful might happen 0 0 0 0  Total GAD 7 Score 1 0 0 0  Anxiety Difficulty Not difficult at all Not difficult at all Not difficult at all Not difficult at all       12/19/2022    9:44 AM 07/02/2022    3:32 PM 04/09/2022   11:21 AM  Depression screen PHQ 2/9  Decreased Interest 0 0 0  Down, Depressed, Hopeless 0 0 0  PHQ - 2 Score 0 0 0  Altered sleeping 0 0 0  Tired, decreased energy 1 0 0  Change in appetite 1 0 0  Feeling bad or failure about yourself  0 0 0  Trouble concentrating 0 0 0  Moving slowly or fidgety/restless 0 0 0  Suicidal thoughts 0 0 0  PHQ-9 Score 2 0 0  Difficult doing work/chores Not difficult at all Not difficult at all Not difficult at all    BP Readings from Last 3 Encounters:   12/19/22 (!) 144/94  09/20/22 138/81  07/18/22 120/80    Physical Exam Vitals and nursing note reviewed.  HENT:     Head: Normocephalic.     Right Ear: External ear normal.     Left Ear: External ear normal.     Nose: Nose normal.  Eyes:     General: No scleral icterus.       Right eye: No discharge.        Left eye: No discharge.     Conjunctiva/sclera: Conjunctivae normal.     Pupils: Pupils are equal, round, and reactive to light.  Neck:     Thyroid: No thyromegaly.     Vascular: No JVD.     Trachea: No tracheal deviation.  Cardiovascular:     Rate and Rhythm: Normal rate and regular rhythm.     Heart sounds: Normal heart sounds. No murmur heard.    No friction rub. No gallop.  Pulmonary:     Effort: No respiratory distress.     Breath sounds: Normal breath sounds. No wheezing or rales.  Abdominal:     General: Bowel sounds are normal.     Palpations: Abdomen is soft.     Tenderness: There is no abdominal tenderness.  Musculoskeletal:        General: Normal range of motion.     Cervical back: Normal range of motion and neck supple.  Lymphadenopathy:     Cervical: No cervical adenopathy.  Skin:    General: Skin is warm.  Neurological:     Mental Status: He is alert.     Deep Tendon Reflexes: Reflexes are normal and symmetric.     Wt Readings from Last 3 Encounters:  12/19/22 171 lb (77.6 kg)  09/20/22 170 lb 4.8 oz (77.2 kg)  07/18/22 176 lb (79.8 kg)    BP (!) 144/94   Pulse 68   Ht 5\' 9"  (1.753 m)   Wt 171 lb (77.6 kg)   SpO2 95%   BMI  25.25 kg/m   Assessment and Plan: 1. Essential hypertension Chronic.  Controlled.  Stable.  Blood pressure 144/94.  Continue propranolol LA 80 mg once a day, lisinopril 20 mg once a day, and amlodipine 2.5 mg once a day.  Review of previous labs are acceptable. - propranolol ER (INDERAL LA) 80 MG 24 hr capsule; Take 1 capsule (80 mg total) by mouth daily.  Dispense: 90 capsule; Refill: 1 - lisinopril (ZESTRIL) 20 MG  tablet; Take 1 tablet (20 mg total) by mouth daily.  Dispense: 90 tablet; Refill: 1 - amLODipine (NORVASC) 2.5 MG tablet; Take 1 tablet (2.5 mg total) by mouth daily.  Dispense: 90 tablet; Refill: 1  2. Migraine without status migrainosus, not intractable, unspecified migraine type Chronic.  Controlled.  Stable.  Decrease in migraine progression with propranolol ER as noted above. - propranolol ER (INDERAL LA) 80 MG 24 hr capsule; Take 1 capsule (80 mg total) by mouth daily.  Dispense: 90 capsule; Refill: 1  3. Lumbar herniated disc Chronic.  Controlled.  Stable.  Patient has herniated lumbar disc disease as well as some involvement of the cervical disc.  Patient controls pain management with meloxicam 15 mg on a daily basis as well as Flexeril 10 mg nightly for muscle spasms. - meloxicam (MOBIC) 15 MG tablet; Take 1 tablet (15 mg total) by mouth daily.  Dispense: 90 tablet; Refill: 1 - cyclobenzaprine (FLEXERIL) 10 MG tablet; Take 1 tablet (10 mg total) by mouth at bedtime as needed for muscle spasms.  Dispense: 90 tablet; Refill: 1     Elizabeth Sauer, MD

## 2022-12-30 ENCOUNTER — Ambulatory Visit: Payer: Medicare Other | Admitting: Family Medicine

## 2023-02-12 ENCOUNTER — Ambulatory Visit: Payer: Medicare Other

## 2023-02-12 VITALS — BP 120/86 | Ht 69.0 in | Wt 169.2 lb

## 2023-02-12 DIAGNOSIS — Z0000 Encounter for general adult medical examination without abnormal findings: Secondary | ICD-10-CM | POA: Diagnosis not present

## 2023-02-12 NOTE — Progress Notes (Signed)
Subjective:   Marcus Gonzalez is a 73 y.o. male who presents for Medicare Annual/Subsequent preventive examination.  Visit Complete: In person  Cardiac Risk Factors include: advanced age (>65men, >76 women);dyslipidemia;hypertension;male gender     Objective:    Today's Vitals   02/12/23 1128 02/12/23 1132  BP: 120/86   Weight: 169 lb 3.2 oz (76.7 kg)   Height: 5\' 9"  (1.753 m)   PainSc:  4    Body mass index is 24.99 kg/m.     02/12/2023   11:37 AM 07/16/2021    2:30 PM 06/14/2020    8:22 AM 06/03/2018    8:37 AM 12/05/2017    8:56 AM 08/22/2017    7:37 AM 05/28/2017    8:34 AM  Advanced Directives  Does Patient Have a Medical Advance Directive? No Yes Yes Yes Yes Yes Yes  Type of Furniture conservator/restorer;Living will Healthcare Power of [place];Living will Living will;Healthcare Power of [employer] Power of [place];Living will Healthcare Power of [place];Living will Healthcare Power of [place];Living will  Does patient want to make changes to medical advance directive?     No - Patient declined No - Patient declined   Copy of Healthcare Power of Attorney in Chart?  No - copy requested No - copy requested No - copy requested No - copy requested No - copy requested No - copy requested  Would patient like information on creating a medical advance directive? No - Patient declined          Current Medications (verified) Outpatient Encounter Medications as of 02/12/2023  Medication Sig   amLODipine (NORVASC) 2.5 MG tablet Take 1 tablet (2.5 mg total) by mouth daily.   Apple Cider Vinegar 188 MG CAPS Take by mouth.   Ascorbic Acid (VITAMIN C) 100 MG tablet Take by mouth.   B Complex Vitamins (VITAMIN B-COMPLEX) TABS Take by mouth.   Bee Pollen 1000 MG TABS Take by mouth.   Bilberry 100 MG CAPS Take by mouth.   Calcium Citrate 1040 MG TABS Take 2 tablets by mouth daily.   Cinnamon 500 MG capsule Take by mouth.   clotrimazole-betamethasone  (LOTRISONE) cream Apply 1 Application topically daily.   Coenzyme Q10 (COQ-10) 100 MG CAPS Take by mouth.   Cranberry 200 MG CAPS Take by mouth.   cyclobenzaprine (FLEXERIL) 10 MG tablet Take 1 tablet (10 mg total) by mouth at bedtime as needed for muscle spasms.   diclofenac Sodium (VOLTAREN) 1 % GEL Apply 2 g topically 4 (four) times daily.   Echinacea 500 MG CAPS Take by mouth.   famotidine (PEPCID) 20 MG tablet Take 20 mg by mouth 2 (two) times daily.   Flaxseed, Linseed, (FLAXSEED OIL PO) Take by mouth.   Garlic 1000 MG CAPS Take by mouth.   Glucosamine-Chondroitin (GLUCOSAMINE CHONDR COMPLEX PO) Take by mouth.   lisinopril (ZESTRIL) 20 MG tablet Take 1 tablet (20 mg total) by mouth daily.   meloxicam (MOBIC) 15 MG tablet Take 1 tablet (15 mg total) by mouth daily.   Multiple Vitamins-Minerals (MULTIVITAMIN WITH MINERALS) tablet Take 1 tablet by mouth daily.   mupirocin ointment (BACTROBAN) 2 % Apply 1 Application topically 2 (two) times daily.   propranolol ER (INDERAL LA) 80 MG 24 hr capsule Take 1 capsule (80 mg total) by mouth daily.   Shark Cartilage 500 MG CAPS Take by mouth.   tadalafil (CIALIS) 20 MG tablet Take one tablet PRN for erectile dysfunction   UNABLE TO  FIND [place] toxifier/regenerator herb   Vitamin D, Ergocalciferol, (DRISDOL) 1.25 MG (50000 UNIT) CAPS capsule Take 1 capsule (50,000 Units total) by mouth every 7 (seven) days. Take for 8 total doses(weeks)   No facility-administered encounter medications on file as of 02/12/2023.    Allergies (verified) Cephalexin   History: Past Medical History:  Diagnosis Date   BPH (benign prostatic hyperplasia)    Degenerative disc disease, cervical    Headache    Hip pain    Hypertension    Migraine    Prostate cancer [facility])    Past Surgical History:  Procedure Laterality Date   BASAL CELL CARCINOMA EXCISION     arm and leg   COLONOSCOPY  2013   cleared for 5 yrs- Dr Servando Snare   COLONOSCOPY WITH PROPOFOL N/A 08/22/2017    Procedure: COLONOSCOPY WITH PROPOFOL;  Surgeon: [name], MD;  Location: [facility];  Service: Endoscopy;  Laterality: N/A;   COLONOSCOPY WITH PROPOFOL N/A 09/20/2022   Procedure: COLONOSCOPY WITH PROPOFOL;  Surgeon: [name], MD;  Location: [facility];  Service: Endoscopy;  Laterality: N/A;   POLYPECTOMY  08/22/2017   Procedure: POLYPECTOMY INTESTINAL;  Surgeon: [name], MD;  Location: [facility];  Service: Endoscopy;;   POLYPECTOMY  09/20/2022   Procedure: POLYPECTOMY;  Surgeon: [name], MD;  Location: [facility];  Service: Endoscopy;;   skin ca removed     Family History  Problem Relation Age of Onset   Heart disease Father    Diabetes Maternal Aunt    Cancer Maternal Grandmother        colon cancer   Heart disease Mother    Dementia Mother    Other Brother        "bladder issues"   Social History   Socioeconomic History   Marital status: Single    Spouse name: Not on file   Number of children: 0   Years of education: Not on file   Highest education level: Doctorate  Occupational History    Employer: [employer]   Tobacco Use   Smoking status: Former    Current packs/day: 0.00    Average packs/day: 1 pack/day for 29.0 years (29.0 ttl pk-yrs)    Types: Cigarettes    Start date: 51    Quit date: 1999    Years since quitting: 26.0   Smokeless tobacco: Never   Tobacco comments:    smoking cessation materials not required  Vaping Use   Vaping status: Never Used  Substance and Sexual Activity   Alcohol use: Yes    Alcohol/week: 14.0 standard drinks of alcohol    Types: 14 Glasses of wine per week   Drug use: No   Sexual activity: Not Currently  Other Topics Concern   Not on file  Social History Narrative   ** Merged History Encounter **       Social Drivers of Health   Financial Resource Strain: Low Risk  (02/12/2023)   Overall Financial Resource Strain (CARDIA)    Difficulty of Paying Living Expenses: Not hard  at all  Food Insecurity: No Food Insecurity (02/12/2023)   Hunger Vital Sign    Worried About Running Out of Food in the Last Year: Never true    Ran Out of Food in the Last Year: Never true  Transportation Needs: No Transportation Needs (02/12/2023)   PRAPARE - Administrator, Civil Service (Medical): No    Lack of Transportation (Non-Medical): No  Physical Activity: Sufficiently  Active (02/12/2023)   Exercise Vital Sign    Days of Exercise per Week: 3 days    Minutes of Exercise per Session: 60 min  Stress: No Stress Concern Present (02/12/2023)   [employer] of Occupational Health - Occupational Stress Questionnaire    Feeling of Stress : Only a little  Social Connections: Moderately Isolated (02/12/2023)   Social Connection and Isolation Panel [NHANES]    Frequency of Communication with Friends and Family: More than three times a week    Frequency of Social Gatherings with Friends and Family: More than three times a week    Attends Religious Services: Never    Database administrator or Organizations: Yes    Attends Engineer, structural: More than 4 times per year    Marital Status: Never married    Tobacco Counseling Counseling given: Not Answered Tobacco comments: smoking cessation materials not required   Clinical Intake:  Pre-visit preparation completed: Yes  Pain : 0-10 Pain Score: 4  Pain Type: Chronic pain Pain Location: Hip Pain Orientation: Upper, Right Pain Descriptors / Indicators: Stabbing, Aching Pain Onset: More than a month ago Pain Frequency: Intermittent     BMI - recorded: 24.99 Nutritional Status: BMI of 19-24  Normal Nutritional Risks: None Diabetes: No  How often do you need to have someone help you when you read instructions, pamphlets, or other written materials from your doctor or pharmacy?: 1 - Never  Interpreter Needed?: No  Information entered by :: Kennedy Bucker, LPN   Activities of Daily Living     02/12/2023   11:39 AM 09/20/2022    7:08 AM  In your present state of health, do you have any difficulty performing the following activities:  Hearing? 0 0  Vision? 0 0  Difficulty concentrating or making decisions? 0 0  Walking or climbing stairs? 0 0  Dressing or bathing? 0 0  Doing errands, shopping? 0   Preparing Food and eating ? N   Using the Toilet? N   In the past six months, have you accidently leaked urine? N   Do you have problems with loss of bowel control? N   Managing your Medications? N   Managing your Finances? N   Housekeeping or managing your Housekeeping? N     Patient Care Team: [name], MD as PCP - General (Family Medicine) Dasher, Cliffton Asters, MD as Consulting Physician (Dermatology) Adriana Simas Almyra Deforest, MD as Consulting Physician (Dermatology) Gwyneth Revels, DPM as Referring Physician (Podiatry)  Indicate any recent Medical Services you may have received from other than [facility] providers in the past year (date may be approximate).     Assessment:   This is a routine wellness examination for [place].  Hearing/Vision screen Hearing Screening - Comments:: NO AIDS Vision Screening - Comments:: NO GLASSES, HAD CATARACT SGY- DR.CHEEK   Goals Addressed             This Visit's Progress    DIET - EAT MORE FRUITS AND VEGETABLES         Depression Screen    02/12/2023   11:35 AM 12/19/2022    9:44 AM 07/02/2022    3:32 PM 04/09/2022   11:21 AM 12/24/2021    9:59 AM 09/10/2021    8:26 AM 09/06/2021    8:01 AM  PHQ 2/9 Scores  PHQ - 2 Score 0 0 0 0 0 0 0  PHQ- 9 Score 0 2 0 0 0 0 0    Fall  Risk    02/12/2023   11:39 AM 12/19/2022    9:44 AM 07/02/2022    3:32 PM 04/09/2022   11:20 AM 12/24/2021    9:59 AM  Fall Risk   Falls in the past year? 0 0 0 0 0  Number falls in past yr: 0 0 0 0 0  Injury with Fall? 0 0 0 0 0  Risk for fall due to : No Fall Risks No Fall Risks No Fall Risks No Fall Risks No Fall Risks  Follow up Falls prevention  discussed;Falls evaluation completed Falls evaluation completed Falls evaluation completed Falls evaluation completed Falls evaluation completed    MEDICARE RISK AT HOME: Medicare Risk at Home Any stairs in or around the home?: Yes If so, are there any without handrails?: No Home free of loose throw rugs in walkways, pet beds, electrical cords, etc?: Yes Adequate lighting in your home to reduce risk of falls?: Yes Life alert?: No Use of a cane, walker or w/c?: No Grab bars in the bathroom?: No Shower chair or bench in shower?: No Elevated toilet seat or a handicapped toilet?: No  TIMED UP AND GO:  Was the test performed?  Yes  Length of time to ambulate 10 feet: 4 sec Gait steady and fast without use of assistive device    Cognitive Function:        02/12/2023   11:40 AM 06/03/2018    8:41 AM 12/05/2017    8:52 AM 05/28/2017    8:19 AM  6CIT Screen  What Year? 0 points 0 points 0 points 0 points  What month? 0 points 0 points 0 points 0 points  What time? 0 points 0 points 0 points 0 points  Count back from 20 0 points 0 points 0 points 0 points  Months in reverse 0 points 0 points 0 points 0 points  Repeat phrase 0 points 0 points 0 points 0 points  Total Score 0 points 0 points 0 points 0 points    Immunizations Immunization History  Administered Date(s) Administered   Fluad Quad(high Dose 65+) 11/05/2018, 11/10/2019, 11/07/2020, 11/02/2021, 11/17/2022   Influenza, High Dose Seasonal PF 11/29/2016   Influenza, Seasonal, Injecte, Preservative Fre 11/06/2010   Influenza,inj,Quad PF,6+ Mos 11/23/2012, 10/29/2013, 11/22/2014, 11/13/2015   Influenza-Unspecified 10/29/2013, 10/28/2016, 11/28/2017   PFIZER Comirnaty(Gray Top)Covid-19 Tri-Sucrose Vaccine 02/22/2019, 03/15/2019   PFIZER(Purple Top)SARS-COV-2 Vaccination 11/01/2019, 06/09/2020   Pfizer Covid-19 Vaccine Bivalent Booster 6yrs & up 11/03/2020   Pneumococcal Conjugate-13 05/28/2017   Pneumococcal Polysaccharide-23  06/03/2018   Td 06/16/2020   Tdap 04/03/2010   Zoster Recombinant(Shingrix) 12/26/2018, 04/10/2019    TDAP status: Up to date  Flu Vaccine status: Up to date  Pneumococcal vaccine status: Up to date  Covid-19 vaccine status: Completed vaccines  Qualifies for Shingles Vaccine? Yes   Zostavax completed No   Shingrix Completed?: Yes  Screening Tests Health Maintenance  Topic Date Due   COVID-19 Vaccine (6 - 2024-25 season) 09/29/2022   Medicare Annual Wellness (AWV)  02/12/2024   Colonoscopy  09/19/2029   DTaP/Tdap/Td (3 - Td or Tdap) 06/17/2030   Pneumonia Vaccine 71+ Years old  Completed   INFLUENZA VACCINE  Completed   Zoster Vaccines- Shingrix  Completed   HPV VACCINES  Aged Out   Hepatitis C Screening  Discontinued    Health Maintenance  Health Maintenance Due  Topic Date Due   COVID-19 Vaccine (6 - 2024-25 season) 09/29/2022    Colorectal cancer screening: Type of screening: Colonoscopy. Completed  09/20/22. Repeat every 7 years  Lung Cancer Screening: (Low Dose CT Chest recommended if Age 38-80 years, 20 pack-year currently smoking OR have quit w/in 15years.) does not qualify.    Additional Screening:  Hepatitis C Screening: does qualify; Completed NO  Vision Screening: Recommended annual ophthalmology exams for early detection of glaucoma and other disorders of the eye. Is the patient up to date with their annual eye exam?  Yes  Who is the provider or what is the name of the office in which the patient attends annual eye exams? DR.CHEEK If pt is not established with a provider, would they like to be referred to a provider to establish care? No .   Dental Screening: Recommended annual dental exams for proper oral hygiene   Community Resource Referral / Chronic Care Management: CRR required this visit?  No   CCM required this visit?  No     Plan:     I have personally reviewed and noted the following in the patient's chart:   Medical and social  history Use of alcohol, tobacco or illicit drugs  Current medications and supplements including opioid prescriptions. Patient is not currently taking opioid prescriptions. Functional ability and status Nutritional status Physical activity Advanced directives List of other physicians Hospitalizations, surgeries, and ER visits in previous 12 months Vitals Screenings to include cognitive, depression, and falls Referrals and appointments  In addition, I have reviewed and discussed with patient certain preventive protocols, quality metrics, and best practice recommendations. A written personalized care plan for preventive services as well as general preventive health recommendations were provided to patient.     Hal Hope, LPN   05/06/8117   After Visit Summary: (In Person-Declined) Patient declined AVS at this time.  Nurse Notes: NONE

## 2023-02-12 NOTE — Patient Instructions (Addendum)
 Mr. Marcus Gonzalez , Thank you for taking time to come for your Medicare Wellness Visit. I appreciate your ongoing commitment to your health goals. Please review the following plan we discussed and let me know if I can assist you in the future.   Referrals/Orders/Follow-Ups/Clinician Recommendations: NONE  This is a list of the screening recommended for you and due dates:  Health Maintenance  Topic Date Due   COVID-19 Vaccine (6 - 2024-25 season) 09/29/2022   Medicare Annual Wellness Visit  02/12/2024   Colon Cancer Screening  09/19/2029   DTaP/Tdap/Td vaccine (3 - Td or Tdap) 06/17/2030   Pneumonia Vaccine  Completed   Flu Shot  Completed   Zoster (Shingles) Vaccine  Completed   HPV Vaccine  Aged Out   Hepatitis C Screening  Discontinued    Advanced directives: (ACP Link)Information on Advanced Care Planning can be found at [place]  Secretary of [facility] Advance Health Care Directives Advance Health Care Directives ([email or web address]   Next Medicare Annual Wellness Visit scheduled for next year: Yes   02/18/24 @ 11:30 AM IN PERSON

## 2023-02-18 ENCOUNTER — Telehealth: Payer: Self-pay | Admitting: Family Medicine

## 2023-02-18 NOTE — Telephone Encounter (Signed)
Copied from CRM [id number]. Topic: General - Other >> Feb 18, 2023 10:53 AM Phill Myron wrote: Pt. Marcus Gonzalez is requesting a call back from doctor, on a matter that was discussed yesterday. Patent declined to say what it is about. Please advise

## 2023-02-18 NOTE — Telephone Encounter (Signed)
Provider spoke with patient

## 2023-02-27 ENCOUNTER — Ambulatory Visit: Payer: Medicare Other | Admitting: Family Medicine

## 2023-02-27 ENCOUNTER — Encounter: Payer: Self-pay | Admitting: Family Medicine

## 2023-02-27 ENCOUNTER — Ambulatory Visit
Admission: RE | Admit: 2023-02-27 | Discharge: 2023-02-27 | Disposition: A | Discharge status: Home / Self Care | Payer: Medicare Other | Source: Ambulatory Visit | Attending: Family Medicine | Admitting: Family Medicine

## 2023-02-27 ENCOUNTER — Ambulatory Visit
Admission: RE | Admit: 2023-02-27 | Discharge: 2023-02-27 | Disposition: A | Discharge status: Home / Self Care | Payer: Medicare Other | Attending: Family Medicine | Admitting: Family Medicine

## 2023-02-27 VITALS — Ht 69.0 in

## 2023-02-27 DIAGNOSIS — M25651 Stiffness of right hip, not elsewhere classified: Secondary | ICD-10-CM

## 2023-02-27 DIAGNOSIS — R102 Pelvic and perineal pain: Secondary | ICD-10-CM | POA: Diagnosis not present

## 2023-02-27 NOTE — Progress Notes (Signed)
Date:  02/27/2023   Name:  Marcus Gonzalez   DOB:  1950-10-13   MRN:  [id number]   Chief Complaint: Hip Pain (170lb 128/78 96 64  X2 weeks, comes and goes, right, sharp pain, 7 on pain scale when it happens /)  hip  Hip Pain  The incident occurred more than 1 week ago. There was no injury mechanism. The pain is present in the right hip (along pelvis.posterior iliac crest). The pain is mild. The pain has been Fluctuating since onset. Pertinent negatives include no loss of motion or numbness. The symptoms are aggravated by palpation and movement (positioning bed). The treatment provided mild relief.    Lab Results  Component Value Date   NA 142 07/04/2022   K 4.0 07/04/2022   CO2 29 07/04/2022   GLUCOSE 101 (H) 07/04/2022   BUN 13 07/04/2022   CREATININE 1.18 07/04/2022   CALCIUM 9.8 07/04/2022   EGFR 66 07/04/2022   GFRNONAA 74 12/16/2019   Lab Results  Component Value Date   CHOL 167 07/04/2022   HDL 67 07/04/2022   LDLCALC 79 07/04/2022   TRIG 119 07/04/2022   CHOLHDL 2.3 11/29/2016   No results found for: "TSH" No results found for: "HGBA1C" No results found for: "WBC", "HGB", "HCT", "MCV", "PLT" Lab Results  Component Value Date   ALT 27 07/04/2022   AST 28 07/04/2022   ALKPHOS 67 07/04/2022   BILITOT 0.7 07/04/2022   No results found for: "25OHVITD2", "25OHVITD3", "VD25OH"   Review of Systems  Cardiovascular:  Negative for chest pain and palpitations.  Gastrointestinal:  Negative for abdominal pain.  Musculoskeletal:  Negative for arthralgias, back pain and joint swelling.  Neurological:  Negative for numbness.    Patient Active Problem List   Diagnosis Date Noted  . History of colonic polyps 09/20/2022  . Polyp of transverse colon 09/20/2022  . Traumatic closed nondisplaced fracture of distal fibula with delayed healing, left 09/10/2021  . Abdominal aortic atherosclerosis (HCC) 07/07/2020  . Arthralgia of hip 06/03/2018  . Arthritis of foot  06/03/2018  . Encounter for screening colonoscopy   . Benign neoplasm of ascending colon   . Polyp of sigmoid colon   . Rectal polyp   . 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 Hyperlipidemia 11/29/2016  . Degeneration, intervertebral disc, cervical 04/19/2016  . Migraine without status migrainosus, not intractable 04/19/2016  . Essential hypertension 04/19/2016  . Cephalalgia 05/24/2014  . Routine general medical examination at a health care facility 05/24/2014  . Anxiety attack 05/24/2014  . Arthritis 05/24/2014  . Benign prostatic hyperplasia without lower urinary tract symptoms 05/24/2014  . Narrowing of intervertebral disc space 05/24/2014  . History of migraine headaches 05/24/2014  . Personal history of other malignant neoplasm of skin 02/28/2012    Allergies  Allergen Reactions  . Cephalexin     Past Surgical History:  Procedure Laterality Date  . BASAL CELL CARCINOMA EXCISION     arm and leg  . COLONOSCOPY  2013   cleared for 5 yrs- Dr Servando Snare  . COLONOSCOPY WITH PROPOFOL N/A 08/22/2017   Procedure: COLONOSCOPY WITH PROPOFOL;  Surgeon: [name], MD;  Location: [facility];  Service: Endoscopy;  Laterality: N/A;  . COLONOSCOPY WITH PROPOFOL N/A 09/20/2022   Procedure: COLONOSCOPY WITH PROPOFOL;  Surgeon: [name], MD;  Location: [facility];  Service: Endoscopy;  Laterality: N/A;  . POLYPECTOMY  08/22/2017   Procedure: POLYPECTOMY INTESTINAL;  Surgeon: [name], MD;  Location:  [facility];  Service: Endoscopy;;  . POLYPECTOMY  09/20/2022   Procedure: POLYPECTOMY;  Surgeon: [name], MD;  Location: [facility];  Service: Endoscopy;;  . skin ca removed      Social History   Tobacco Use  . Smoking status: Former    Current packs/day: 0.00    Average packs/day: 1 pack/day for 29.0 years (29.0 ttl pk-yrs)    Types: Cigarettes    Start date: 2    Quit date: 1999    Years since quitting: 26.0  . Smokeless  tobacco: Never  . Tobacco comments:    smoking cessation materials not required  Vaping Use  . Vaping status: Never Used  Substance Use Topics  . Alcohol use: Yes    Alcohol/week: 14.0 standard drinks of alcohol    Types: 14 Glasses of wine per week  . Drug use: No     Medication list has been reviewed and updated.  Current Meds  Medication Sig  . amLODipine (NORVASC) 2.5 MG tablet Take 1 tablet (2.5 mg total) by mouth daily.  Marland Kitchen Apple Cider Vinegar 188 MG CAPS Take by mouth.  . Ascorbic Acid (VITAMIN C) 100 MG tablet Take by mouth.  . B Complex Vitamins (VITAMIN B-COMPLEX) TABS Take by mouth.  Alphonsus Sias Pollen 1000 MG TABS Take by mouth.  . Bilberry 100 MG CAPS Take by mouth.  . Calcium Citrate 1040 MG TABS Take 2 tablets by mouth daily.  . Cinnamon 500 MG capsule Take by mouth.  . clotrimazole-betamethasone (LOTRISONE) cream Apply 1 Application topically daily.  . Coenzyme Q10 (COQ-10) 100 MG CAPS Take by mouth.  . Cranberry 200 MG CAPS Take by mouth.  . cyclobenzaprine (FLEXERIL) 10 MG tablet Take 1 tablet (10 mg total) by mouth at bedtime as needed for muscle spasms.  . diclofenac Sodium (VOLTAREN) 1 % GEL Apply 2 g topically 4 (four) times daily.  . Echinacea 500 MG CAPS Take by mouth.  . famotidine (PEPCID) 20 MG tablet Take 20 mg by mouth 2 (two) times daily.  . Flaxseed, Linseed, (FLAXSEED OIL PO) Take by mouth.  . Garlic 1000 MG CAPS Take by mouth.  . Glucosamine-Chondroitin (GLUCOSAMINE CHONDR COMPLEX PO) Take by mouth.  Marland Kitchen lisinopril (ZESTRIL) 20 MG tablet Take 1 tablet (20 mg total) by mouth daily.  . meloxicam (MOBIC) 15 MG tablet Take 1 tablet (15 mg total) by mouth daily.  . Multiple Vitamins-Minerals (MULTIVITAMIN WITH MINERALS) tablet Take 1 tablet by mouth daily.  . mupirocin ointment (BACTROBAN) 2 % Apply 1 Application topically 2 (two) times daily.  . propranolol ER (INDERAL LA) 80 MG 24 hr capsule Take 1 capsule (80 mg total) by mouth daily.  Glory Rosebush Cartilage  500 MG CAPS Take by mouth.  . tadalafil (CIALIS) 20 MG tablet Take one tablet PRN for erectile dysfunction  . UNABLE TO FIND [employer] toxifier/regenerator herb  . Vitamin D, Ergocalciferol, (DRISDOL) 1.25 MG (50000 UNIT) CAPS capsule Take 1 capsule (50,000 Units total) by mouth every 7 (seven) days. Take for 8 total doses(weeks)       12/19/2022    9:44 AM 07/02/2022    3:33 PM 04/09/2022   11:21 AM 12/24/2021   10:00 AM  GAD 7 : Generalized Anxiety Score  Nervous, Anxious, on Edge 0 0 0 0  Control/stop worrying 0 0 0 0  Worry too much - different things 0 0 0 0  Trouble relaxing 0 0 0 0  Restless 0 0 0 0  Easily annoyed or irritable 1 0 0 0  Afraid - awful might happen 0 0 0 0  Total GAD 7 Score 1 0 0 0  Anxiety Difficulty Not difficult at all Not difficult at all Not difficult at all Not difficult at all       02/12/2023   11:35 AM 12/19/2022    9:44 AM 07/02/2022    3:32 PM  Depression screen PHQ 2/9  Decreased Interest 0 0 0  Down, Depressed, Hopeless 0 0 0  PHQ - 2 Score 0 0 0  Altered sleeping 0 0 0  Tired, decreased energy 0 1 0  Change in appetite 0 1 0  Feeling bad or failure about yourself  0 0 0  Trouble concentrating 0 0 0  Moving slowly or fidgety/restless 0 0 0  Suicidal thoughts 0 0 0  PHQ-9 Score 0 2 0  Difficult doing work/chores Not difficult at all Not difficult at all Not difficult at all    BP Readings from Last 3 Encounters:  02/12/23 120/86  12/19/22 (!) 144/94  09/20/22 138/81    Physical Exam Vitals and nursing note reviewed.  Cardiovascular:     Heart sounds: No murmur heard.    No friction rub. No gallop.  Pulmonary:     Breath sounds: No wheezing, rhonchi or rales.  Abdominal:     Tenderness: There is abdominal tenderness. There is no guarding or rebound.  Musculoskeletal:     Left hip: Tenderness present.       Legs:     Comments: Tenderness is lateral to the SI joint on the right/there is palpable tenderness along the piriformis.   This may be an extensor versus a hamstring concern and we will obtain an x-ray and then we will likely refer to sports medicine for an evaluation.    Wt Readings from Last 3 Encounters:  02/12/23 169 lb 3.2 oz (76.7 kg)  12/19/22 171 lb (77.6 kg)  09/20/22 170 lb 4.8 oz (77.2 kg)    Ht 5\' 9"  (1.753 m)   BMI 24.99 kg/m   Assessment and Plan: 1. Decreased mobility of joint of right side of pelvis (Primary) New onset.  Persistent.  Tenderness is noted during examination of the posterior aspect of the right pelvic area along the origins of the hamstring as well as extensors of the hip.  There may be some piriformis tenderness and we will obtain an x-ray of this area and proceed with sports medicine/orthopedic evaluation. - DG Hip Unilat W OR W/O Pelvis 2-3 Views Right; Future     Elizabeth Sauer, MD

## 2023-03-17 ENCOUNTER — Encounter: Payer: Medicare Other | Admitting: Family Medicine

## 2023-03-17 ENCOUNTER — Ambulatory Visit (INDEPENDENT_AMBULATORY_CARE_PROVIDER_SITE_OTHER): Payer: Medicare Other | Admitting: Family Medicine

## 2023-03-17 ENCOUNTER — Encounter: Payer: Self-pay | Admitting: Family Medicine

## 2023-03-17 VITALS — BP 144/84 | HR 83 | Ht 69.0 in

## 2023-03-17 DIAGNOSIS — M249 Joint derangement, unspecified: Secondary | ICD-10-CM | POA: Insufficient documentation

## 2023-03-17 NOTE — Progress Notes (Signed)
Primary Care / Sports Medicine Office Visit  Patient Information:  Patient ID: Marcus Gonzalez, male DOB: 1950/12/10 Age: 73 y.o. MRN: [id number]   Marcus Gonzalez is a pleasant 73 y.o. male presenting with the following:  Chief Complaint  Patient presents with   Hip Pain    X1 month,Comes and goes, stabbing pain, 6-7 pain scale, xrays completed     Vitals:   03/17/23 0832  BP: (!) 144/84  Pulse: 83  SpO2: 95%   Vitals:   03/17/23 0832  Height: 5\' 9"  (1.753 m)   Body mass index is 24.99 kg/m.  DG Hip Unilat W OR W/O Pelvis 2-3 Views Right Result Date: 03/12/2023 CLINICAL DATA:  Decreased mobility on the right side of the pelvis and posterior right pelvic pain for 1-2 weeks. EXAM: DG HIP (WITH OR WITHOUT PELVIS) 2-3V RIGHT COMPARISON:  None Available. FINDINGS: There is no acute bony or joint abnormality. Hip joint spaces are preserved. No evidence of avascular necrosis of the femoral heads or focal bone lesion. Marked convex left lumbar scoliosis and degenerative change are partially imaged. Soft tissues are unremarkable. IMPRESSION: 1. Negative right hip. 2. Marked convex left lumbar scoliosis and degenerative disease partially imaged. Electronically Signed   By: Drusilla Kanner M.D.   On: 03/12/2023 11:34     Independent interpretation of notes and tests performed by another provider:   See below.  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 He was referred by Dr. Yetta Barre for evaluation of his right hip pain.  He experiences intermittent sharp pain in his right hip, described as a 'knife just sticking right in' for about thirty seconds. The pain is localized to the back right side and does not radiate down the  leg. It typically occurs when he is lying in bed. The pain is not debilitating and is described as momentary. It is new and not present every day, sometimes occurring three times a day and other times not at all for a week.  The pain began possibly before his recent trip to [place] and [place], which lasted six weeks and ended on February 04, 2023. During the trip, he was active, going to the gym frequently, and did not experience the pain. However, upon returning, he has been less active, which he wonders might be contributing to the pain.  He has not used any topical treatments for the pain as it resolves quickly. No numbness or tingling is associated with the pain. No radiation of the pain to the front or down the leg.  Physical Exam MUSCULOSKELETAL: Negative straight leg raise. FABER test positive for localizing posterior right hip mild discomfort. FADIR localizes to front, not his stated symptoms. Nontender throughout gluteals.  Results RADIOLOGY Right hip X-ray: Normal hip joints bilaterally, no abnormalities in bone structure, no signs of hip joint pathology. (02/27/2023)  Assessment and Plan Right Sacroiliac (SI) Joint Dysfunction Intermittent, sharp, localized pain in the right SI joint region. No radiation of pain, no tenderness on palpation. Pain is not associated with hip joint or nerve root irritation. Likely due to muscle tightness. -Start home-based rehab with materials provided today. -Engage in regular exercise, focusing on hamstring, quadriceps, and adductor stretches,  as well as isometric strengthening exercises.  -Consider using Voltaren gel (topical NSAID) up to four times a day as needed for pain control, especially before and after exercise sessions. -Follow-up as needed if symptoms persist or worsen.        Orders & Medications Medications: No orders of the defined types were placed in this encounter.  No orders of the defined types were placed in this  encounter.    No follow-ups on file.     [name], MD, [facility]   Primary Care Sports Medicine Primary Care and Sports Medicine at [facility]

## 2023-03-17 NOTE — Patient Instructions (Addendum)
1. Home-Based Rehab    - Begin using the materials provided today to guide your exercises at home.  2. Exercise Routine    - Engage in regular exercises focusing on stretching your hamstrings, quadriceps, and adductors.    - Include isometric strengthening exercises in your routine.  3. Pain Management    - Consider applying Voltaren gel, a topical pain reliever, up to four times a day as needed, especially before and after exercise sessions to help manage any pain.  4. Follow-Up    - Please reach out if your symptoms persist or worsen, and we can discuss further steps.

## 2023-03-17 NOTE — Assessment & Plan Note (Signed)
History of Present Illness Marcus Gonzalez is a 73 year old male who presents with intermittent sharp right posterior hip pain. He was referred by Dr. Yetta Barre for evaluation of his right hip pain.  He experiences intermittent sharp pain in his right hip, described as a 'knife just sticking right in' for about thirty seconds. The pain is localized to the back right side and does not radiate down the leg. It typically occurs when he is lying in bed. The pain is not debilitating and is described as momentary. It is new and not present every day, sometimes occurring three times a day and other times not at all for a week.  The pain began possibly before his recent trip to [place] and [place], which lasted six weeks and ended on February 04, 2023. During the trip, he was active, going to the gym frequently, and did not experience the pain. However, upon returning, he has been less active, which he wonders might be contributing to the pain.  He has not used any topical treatments for the pain as it resolves quickly. No numbness or tingling is associated with the pain. No radiation of the pain to the front or down the leg.  Physical Exam MUSCULOSKELETAL: Negative straight leg raise. FABER test positive for localizing posterior right hip mild discomfort. FADIR localizes to front, not his stated symptoms. Nontender throughout gluteals.  Results RADIOLOGY Right hip X-ray: Normal hip joints bilaterally, no abnormalities in bone structure, no signs of hip joint pathology. (02/27/2023)  Assessment and Plan Right Sacroiliac (SI) Joint Dysfunction Intermittent, sharp, localized pain in the right SI joint region. No radiation of pain, no tenderness on palpation. Pain is not associated with hip joint or nerve root irritation. Likely due to muscle tightness. -Start home-based rehab with materials provided today. -Engage in regular exercise, focusing on hamstring, quadriceps, and adductor stretches, as well as  isometric strengthening exercises.  -Consider using Voltaren gel (topical NSAID) up to four times a day as needed for pain control, especially before and after exercise sessions. -Follow-up as needed if symptoms persist or worsen.

## 2023-04-29 DIAGNOSIS — E785 Hyperlipidemia, unspecified: Secondary | ICD-10-CM | POA: Diagnosis not present

## 2023-04-29 DIAGNOSIS — I10 Essential (primary) hypertension: Secondary | ICD-10-CM | POA: Diagnosis not present

## 2023-04-29 DIAGNOSIS — G43909 Migraine, unspecified, not intractable, without status migrainosus: Secondary | ICD-10-CM | POA: Diagnosis not present

## 2023-04-29 DIAGNOSIS — M1990 Unspecified osteoarthritis, unspecified site: Secondary | ICD-10-CM | POA: Diagnosis not present

## 2023-04-29 DIAGNOSIS — N400 Enlarged prostate without lower urinary tract symptoms: Secondary | ICD-10-CM | POA: Diagnosis not present

## 2023-04-30 DIAGNOSIS — E785 Hyperlipidemia, unspecified: Secondary | ICD-10-CM | POA: Diagnosis not present

## 2023-04-30 DIAGNOSIS — C61 Malignant neoplasm of prostate: Secondary | ICD-10-CM | POA: Diagnosis not present

## 2023-05-15 DIAGNOSIS — L820 Inflamed seborrheic keratosis: Secondary | ICD-10-CM | POA: Diagnosis not present

## 2023-05-15 DIAGNOSIS — D485 Neoplasm of uncertain behavior of skin: Secondary | ICD-10-CM | POA: Diagnosis not present

## 2023-05-15 DIAGNOSIS — D2261 Melanocytic nevi of right upper limb, including shoulder: Secondary | ICD-10-CM | POA: Diagnosis not present

## 2023-05-15 DIAGNOSIS — C44712 Basal cell carcinoma of skin of right lower limb, including hip: Secondary | ICD-10-CM | POA: Diagnosis not present

## 2023-05-15 DIAGNOSIS — L570 Actinic keratosis: Secondary | ICD-10-CM | POA: Diagnosis not present

## 2023-05-15 DIAGNOSIS — Z85828 Personal history of other malignant neoplasm of skin: Secondary | ICD-10-CM | POA: Diagnosis not present

## 2023-05-15 DIAGNOSIS — D2272 Melanocytic nevi of left lower limb, including hip: Secondary | ICD-10-CM | POA: Diagnosis not present

## 2023-05-15 DIAGNOSIS — D2262 Melanocytic nevi of left upper limb, including shoulder: Secondary | ICD-10-CM | POA: Diagnosis not present

## 2023-05-15 DIAGNOSIS — L111 Transient acantholytic dermatosis [Grover]: Secondary | ICD-10-CM | POA: Diagnosis not present

## 2023-05-15 DIAGNOSIS — D225 Melanocytic nevi of trunk: Secondary | ICD-10-CM | POA: Diagnosis not present

## 2023-05-31 ENCOUNTER — Other Ambulatory Visit: Payer: Self-pay | Admitting: Family Medicine

## 2023-05-31 DIAGNOSIS — B356 Tinea cruris: Secondary | ICD-10-CM

## 2023-06-19 ENCOUNTER — Ambulatory Visit: Payer: Self-pay | Admitting: Family Medicine

## 2023-07-22 DIAGNOSIS — C44712 Basal cell carcinoma of skin of right lower limb, including hip: Secondary | ICD-10-CM | POA: Diagnosis not present

## 2023-10-16 DIAGNOSIS — Z1331 Encounter for screening for depression: Secondary | ICD-10-CM | POA: Diagnosis not present

## 2023-10-16 DIAGNOSIS — R051 Acute cough: Secondary | ICD-10-CM | POA: Diagnosis not present

## 2023-11-06 DIAGNOSIS — G43909 Migraine, unspecified, not intractable, without status migrainosus: Secondary | ICD-10-CM | POA: Diagnosis not present

## 2023-11-06 DIAGNOSIS — E785 Hyperlipidemia, unspecified: Secondary | ICD-10-CM | POA: Diagnosis not present

## 2023-11-06 DIAGNOSIS — I10 Essential (primary) hypertension: Secondary | ICD-10-CM | POA: Diagnosis not present

## 2023-11-06 DIAGNOSIS — Z0000 Encounter for general adult medical examination without abnormal findings: Secondary | ICD-10-CM | POA: Diagnosis not present

## 2023-11-06 DIAGNOSIS — N400 Enlarged prostate without lower urinary tract symptoms: Secondary | ICD-10-CM | POA: Diagnosis not present

## 2023-11-06 DIAGNOSIS — B356 Tinea cruris: Secondary | ICD-10-CM | POA: Diagnosis not present

## 2023-11-06 DIAGNOSIS — M1990 Unspecified osteoarthritis, unspecified site: Secondary | ICD-10-CM | POA: Diagnosis not present

## 2023-11-06 DIAGNOSIS — K219 Gastro-esophageal reflux disease without esophagitis: Secondary | ICD-10-CM | POA: Diagnosis not present

## 2023-11-10 DIAGNOSIS — I10 Essential (primary) hypertension: Secondary | ICD-10-CM | POA: Diagnosis not present

## 2023-11-10 DIAGNOSIS — M1990 Unspecified osteoarthritis, unspecified site: Secondary | ICD-10-CM | POA: Diagnosis not present

## 2023-11-10 DIAGNOSIS — B356 Tinea cruris: Secondary | ICD-10-CM | POA: Diagnosis not present

## 2023-11-10 DIAGNOSIS — N400 Enlarged prostate without lower urinary tract symptoms: Secondary | ICD-10-CM | POA: Diagnosis not present

## 2023-11-10 DIAGNOSIS — G43909 Migraine, unspecified, not intractable, without status migrainosus: Secondary | ICD-10-CM | POA: Diagnosis not present

## 2023-11-10 DIAGNOSIS — Z0000 Encounter for general adult medical examination without abnormal findings: Secondary | ICD-10-CM | POA: Diagnosis not present

## 2023-11-10 DIAGNOSIS — E785 Hyperlipidemia, unspecified: Secondary | ICD-10-CM | POA: Diagnosis not present

## 2023-11-10 DIAGNOSIS — K219 Gastro-esophageal reflux disease without esophagitis: Secondary | ICD-10-CM | POA: Diagnosis not present

## 2023-11-20 DIAGNOSIS — Z23 Encounter for immunization: Secondary | ICD-10-CM | POA: Diagnosis not present

## 2023-11-24 DIAGNOSIS — Z961 Presence of intraocular lens: Secondary | ICD-10-CM | POA: Diagnosis not present

## 2023-11-27 DIAGNOSIS — D2261 Melanocytic nevi of right upper limb, including shoulder: Secondary | ICD-10-CM | POA: Diagnosis not present

## 2023-11-27 DIAGNOSIS — L820 Inflamed seborrheic keratosis: Secondary | ICD-10-CM | POA: Diagnosis not present

## 2023-11-27 DIAGNOSIS — D485 Neoplasm of uncertain behavior of skin: Secondary | ICD-10-CM | POA: Diagnosis not present

## 2023-11-27 DIAGNOSIS — D2262 Melanocytic nevi of left upper limb, including shoulder: Secondary | ICD-10-CM | POA: Diagnosis not present

## 2023-11-27 DIAGNOSIS — D225 Melanocytic nevi of trunk: Secondary | ICD-10-CM | POA: Diagnosis not present

## 2023-11-27 DIAGNOSIS — C44612 Basal cell carcinoma of skin of right upper limb, including shoulder: Secondary | ICD-10-CM | POA: Diagnosis not present

## 2023-11-27 DIAGNOSIS — D2272 Melanocytic nevi of left lower limb, including hip: Secondary | ICD-10-CM | POA: Diagnosis not present

## 2023-11-27 DIAGNOSIS — L538 Other specified erythematous conditions: Secondary | ICD-10-CM | POA: Diagnosis not present

## 2023-11-27 DIAGNOSIS — L570 Actinic keratosis: Secondary | ICD-10-CM | POA: Diagnosis not present

## 2023-11-27 DIAGNOSIS — C44712 Basal cell carcinoma of skin of right lower limb, including hip: Secondary | ICD-10-CM | POA: Diagnosis not present

## 2023-11-27 DIAGNOSIS — Z85828 Personal history of other malignant neoplasm of skin: Secondary | ICD-10-CM | POA: Diagnosis not present

## 2023-11-27 DIAGNOSIS — R208 Other disturbances of skin sensation: Secondary | ICD-10-CM | POA: Diagnosis not present
